# Patient Record
Sex: Male | Born: 1970 | Race: White | Hispanic: No | Marital: Single | Smoking: Current every day smoker
Health system: Southern US, Community
[De-identification: ages and names within clinical notes are randomized; demographics above are authoritative.]

## PROBLEM LIST (undated history)

## (undated) DIAGNOSIS — F32A Depression, unspecified: Secondary | ICD-10-CM

## (undated) DIAGNOSIS — G43509 Persistent migraine aura without cerebral infarction, not intractable, without status migrainosus: Secondary | ICD-10-CM

## (undated) DIAGNOSIS — R739 Hyperglycemia, unspecified: Secondary | ICD-10-CM

## (undated) DIAGNOSIS — J45909 Unspecified asthma, uncomplicated: Secondary | ICD-10-CM

## (undated) DIAGNOSIS — E785 Hyperlipidemia, unspecified: Secondary | ICD-10-CM

## (undated) DIAGNOSIS — M1990 Unspecified osteoarthritis, unspecified site: Secondary | ICD-10-CM

## (undated) DIAGNOSIS — E669 Obesity, unspecified: Secondary | ICD-10-CM

## (undated) DIAGNOSIS — I10 Essential (primary) hypertension: Secondary | ICD-10-CM

## (undated) DIAGNOSIS — I2510 Atherosclerotic heart disease of native coronary artery without angina pectoris: Secondary | ICD-10-CM

## (undated) DIAGNOSIS — N289 Disorder of kidney and ureter, unspecified: Secondary | ICD-10-CM

## (undated) DIAGNOSIS — F1410 Cocaine abuse, uncomplicated: Secondary | ICD-10-CM

## (undated) DIAGNOSIS — K219 Gastro-esophageal reflux disease without esophagitis: Secondary | ICD-10-CM

## (undated) DIAGNOSIS — F329 Major depressive disorder, single episode, unspecified: Secondary | ICD-10-CM

## (undated) DIAGNOSIS — G4733 Obstructive sleep apnea (adult) (pediatric): Secondary | ICD-10-CM

## (undated) DIAGNOSIS — R51 Headache: Secondary | ICD-10-CM

## (undated) DIAGNOSIS — R519 Headache, unspecified: Secondary | ICD-10-CM

## (undated) DIAGNOSIS — T50902A Poisoning by unspecified drugs, medicaments and biological substances, intentional self-harm, initial encounter: Secondary | ICD-10-CM

## (undated) HISTORY — PX: CARDIAC CATHETERIZATION: SHX172

## (undated) HISTORY — PX: APPENDECTOMY: SHX54

---

## 2002-03-15 ENCOUNTER — Emergency Department (HOSPITAL_COMMUNITY): Admission: EM | Admit: 2002-03-15 | Discharge: 2002-03-15 | Payer: Self-pay | Admitting: Emergency Medicine

## 2002-03-22 ENCOUNTER — Encounter: Payer: Self-pay | Admitting: Emergency Medicine

## 2002-03-22 ENCOUNTER — Emergency Department (HOSPITAL_COMMUNITY): Admission: EM | Admit: 2002-03-22 | Discharge: 2002-03-22 | Payer: Self-pay | Admitting: Emergency Medicine

## 2003-07-13 ENCOUNTER — Emergency Department (HOSPITAL_COMMUNITY): Admission: EM | Admit: 2003-07-13 | Discharge: 2003-07-13 | Payer: Self-pay

## 2003-07-24 ENCOUNTER — Emergency Department (HOSPITAL_COMMUNITY): Admission: EM | Admit: 2003-07-24 | Discharge: 2003-07-24 | Payer: Self-pay | Admitting: Emergency Medicine

## 2003-08-14 ENCOUNTER — Emergency Department (HOSPITAL_COMMUNITY): Admission: AD | Admit: 2003-08-14 | Discharge: 2003-08-14 | Payer: Self-pay | Admitting: Family Medicine

## 2003-11-02 ENCOUNTER — Emergency Department (HOSPITAL_COMMUNITY): Admission: EM | Admit: 2003-11-02 | Discharge: 2003-11-03 | Payer: Self-pay | Admitting: Emergency Medicine

## 2003-11-06 ENCOUNTER — Emergency Department (HOSPITAL_COMMUNITY): Admission: EM | Admit: 2003-11-06 | Discharge: 2003-11-06 | Payer: Self-pay | Admitting: Emergency Medicine

## 2004-01-15 ENCOUNTER — Emergency Department (HOSPITAL_COMMUNITY): Admission: EM | Admit: 2004-01-15 | Discharge: 2004-01-15 | Payer: Self-pay | Admitting: Emergency Medicine

## 2004-02-12 ENCOUNTER — Emergency Department (HOSPITAL_COMMUNITY): Admission: EM | Admit: 2004-02-12 | Discharge: 2004-02-12 | Payer: Self-pay | Admitting: Emergency Medicine

## 2004-02-15 ENCOUNTER — Emergency Department (HOSPITAL_COMMUNITY): Admission: EM | Admit: 2004-02-15 | Discharge: 2004-02-15 | Payer: Self-pay | Admitting: Emergency Medicine

## 2004-03-04 ENCOUNTER — Emergency Department (HOSPITAL_COMMUNITY): Admission: EM | Admit: 2004-03-04 | Discharge: 2004-03-04 | Payer: Self-pay | Admitting: Emergency Medicine

## 2004-03-20 ENCOUNTER — Emergency Department (HOSPITAL_COMMUNITY): Admission: EM | Admit: 2004-03-20 | Discharge: 2004-03-20 | Payer: Self-pay | Admitting: Emergency Medicine

## 2004-04-16 ENCOUNTER — Emergency Department: Payer: Self-pay | Admitting: Emergency Medicine

## 2004-05-18 ENCOUNTER — Emergency Department (HOSPITAL_COMMUNITY): Admission: EM | Admit: 2004-05-18 | Discharge: 2004-05-19 | Payer: Self-pay | Admitting: Emergency Medicine

## 2004-05-28 ENCOUNTER — Emergency Department: Payer: Self-pay | Admitting: General Practice

## 2004-07-16 ENCOUNTER — Emergency Department (HOSPITAL_COMMUNITY): Admission: EM | Admit: 2004-07-16 | Discharge: 2004-07-16 | Payer: Self-pay | Admitting: Emergency Medicine

## 2004-07-22 ENCOUNTER — Emergency Department (HOSPITAL_COMMUNITY): Admission: EM | Admit: 2004-07-22 | Discharge: 2004-07-22 | Payer: Self-pay | Admitting: Emergency Medicine

## 2004-10-27 ENCOUNTER — Emergency Department (HOSPITAL_COMMUNITY): Admission: EM | Admit: 2004-10-27 | Discharge: 2004-10-27 | Payer: Self-pay | Admitting: Emergency Medicine

## 2004-11-12 ENCOUNTER — Emergency Department (HOSPITAL_COMMUNITY): Admission: EM | Admit: 2004-11-12 | Discharge: 2004-11-12 | Payer: Self-pay | Admitting: Emergency Medicine

## 2004-11-30 ENCOUNTER — Emergency Department (HOSPITAL_COMMUNITY): Admission: EM | Admit: 2004-11-30 | Discharge: 2004-11-30 | Payer: Self-pay | Admitting: Emergency Medicine

## 2005-02-03 ENCOUNTER — Emergency Department (HOSPITAL_COMMUNITY): Admission: EM | Admit: 2005-02-03 | Discharge: 2005-02-03 | Payer: Self-pay | Admitting: Emergency Medicine

## 2006-03-07 ENCOUNTER — Emergency Department (HOSPITAL_COMMUNITY): Admission: EM | Admit: 2006-03-07 | Discharge: 2006-03-07 | Payer: Self-pay | Admitting: Emergency Medicine

## 2006-06-06 ENCOUNTER — Emergency Department (HOSPITAL_COMMUNITY): Admission: EM | Admit: 2006-06-06 | Discharge: 2006-06-06 | Payer: Self-pay | Admitting: Emergency Medicine

## 2006-07-04 ENCOUNTER — Emergency Department (HOSPITAL_COMMUNITY): Admission: EM | Admit: 2006-07-04 | Discharge: 2006-07-04 | Payer: Self-pay | Admitting: Emergency Medicine

## 2006-08-16 ENCOUNTER — Emergency Department (HOSPITAL_COMMUNITY): Admission: EM | Admit: 2006-08-16 | Discharge: 2006-08-16 | Payer: Self-pay | Admitting: Emergency Medicine

## 2006-08-24 ENCOUNTER — Emergency Department (HOSPITAL_COMMUNITY): Admission: EM | Admit: 2006-08-24 | Discharge: 2006-08-24 | Payer: Self-pay | Admitting: Emergency Medicine

## 2006-09-17 ENCOUNTER — Emergency Department (HOSPITAL_COMMUNITY): Admission: EM | Admit: 2006-09-17 | Discharge: 2006-09-17 | Payer: Self-pay | Admitting: Emergency Medicine

## 2006-11-25 ENCOUNTER — Emergency Department (HOSPITAL_COMMUNITY): Admission: EM | Admit: 2006-11-25 | Discharge: 2006-11-25 | Payer: Self-pay | Admitting: Emergency Medicine

## 2007-01-05 ENCOUNTER — Emergency Department (HOSPITAL_COMMUNITY): Admission: EM | Admit: 2007-01-05 | Discharge: 2007-01-06 | Payer: Self-pay | Admitting: Emergency Medicine

## 2007-02-04 ENCOUNTER — Emergency Department (HOSPITAL_COMMUNITY): Admission: EM | Admit: 2007-02-04 | Discharge: 2007-02-04 | Payer: Self-pay | Admitting: Emergency Medicine

## 2007-03-27 ENCOUNTER — Emergency Department (HOSPITAL_COMMUNITY): Admission: EM | Admit: 2007-03-27 | Discharge: 2007-03-27 | Payer: Self-pay | Admitting: Emergency Medicine

## 2007-04-23 ENCOUNTER — Emergency Department (HOSPITAL_COMMUNITY): Admission: EM | Admit: 2007-04-23 | Discharge: 2007-04-23 | Payer: Self-pay | Admitting: Emergency Medicine

## 2007-05-10 ENCOUNTER — Emergency Department (HOSPITAL_COMMUNITY): Admission: EM | Admit: 2007-05-10 | Discharge: 2007-05-10 | Payer: Self-pay | Admitting: Emergency Medicine

## 2007-05-14 ENCOUNTER — Emergency Department (HOSPITAL_COMMUNITY): Admission: EM | Admit: 2007-05-14 | Discharge: 2007-05-14 | Payer: Self-pay | Admitting: Emergency Medicine

## 2007-05-16 ENCOUNTER — Emergency Department (HOSPITAL_COMMUNITY): Admission: EM | Admit: 2007-05-16 | Discharge: 2007-05-16 | Payer: Self-pay | Admitting: Emergency Medicine

## 2007-05-21 ENCOUNTER — Emergency Department (HOSPITAL_COMMUNITY): Admission: EM | Admit: 2007-05-21 | Discharge: 2007-05-21 | Payer: Self-pay | Admitting: Emergency Medicine

## 2007-06-15 ENCOUNTER — Emergency Department (HOSPITAL_COMMUNITY): Admission: EM | Admit: 2007-06-15 | Discharge: 2007-06-15 | Payer: Self-pay | Admitting: Emergency Medicine

## 2007-06-18 ENCOUNTER — Ambulatory Visit: Payer: Self-pay | Admitting: Psychiatry

## 2007-06-18 ENCOUNTER — Emergency Department (HOSPITAL_COMMUNITY): Admission: EM | Admit: 2007-06-18 | Discharge: 2007-06-18 | Payer: Self-pay | Admitting: Emergency Medicine

## 2007-06-18 ENCOUNTER — Inpatient Hospital Stay (HOSPITAL_COMMUNITY): Admission: RE | Admit: 2007-06-18 | Discharge: 2007-06-25 | Payer: Self-pay | Admitting: Psychiatry

## 2007-07-02 ENCOUNTER — Emergency Department (HOSPITAL_COMMUNITY): Admission: EM | Admit: 2007-07-02 | Discharge: 2007-07-02 | Payer: Self-pay | Admitting: Emergency Medicine

## 2007-07-23 ENCOUNTER — Inpatient Hospital Stay (HOSPITAL_COMMUNITY): Admission: EM | Admit: 2007-07-23 | Discharge: 2007-07-24 | Payer: Self-pay | Admitting: Internal Medicine

## 2007-07-31 ENCOUNTER — Inpatient Hospital Stay (HOSPITAL_COMMUNITY): Admission: EM | Admit: 2007-07-31 | Discharge: 2007-08-04 | Payer: Self-pay | Admitting: Emergency Medicine

## 2007-08-06 ENCOUNTER — Emergency Department (HOSPITAL_COMMUNITY): Admission: EM | Admit: 2007-08-06 | Discharge: 2007-08-06 | Payer: Self-pay | Admitting: Emergency Medicine

## 2007-08-15 ENCOUNTER — Emergency Department (HOSPITAL_COMMUNITY): Admission: EM | Admit: 2007-08-15 | Discharge: 2007-08-16 | Payer: Self-pay | Admitting: Emergency Medicine

## 2007-08-21 ENCOUNTER — Emergency Department (HOSPITAL_COMMUNITY): Admission: EM | Admit: 2007-08-21 | Discharge: 2007-08-21 | Payer: Self-pay | Admitting: Emergency Medicine

## 2007-09-05 ENCOUNTER — Emergency Department (HOSPITAL_COMMUNITY): Admission: EM | Admit: 2007-09-05 | Discharge: 2007-09-05 | Payer: Self-pay | Admitting: Emergency Medicine

## 2007-09-07 ENCOUNTER — Emergency Department (HOSPITAL_COMMUNITY): Admission: EM | Admit: 2007-09-07 | Discharge: 2007-09-07 | Payer: Self-pay | Admitting: Emergency Medicine

## 2007-09-25 ENCOUNTER — Emergency Department (HOSPITAL_COMMUNITY): Admission: EM | Admit: 2007-09-25 | Discharge: 2007-09-26 | Payer: Self-pay | Admitting: Emergency Medicine

## 2007-09-29 ENCOUNTER — Emergency Department (HOSPITAL_COMMUNITY): Admission: EM | Admit: 2007-09-29 | Discharge: 2007-09-30 | Payer: Self-pay | Admitting: Emergency Medicine

## 2009-02-18 ENCOUNTER — Emergency Department (HOSPITAL_COMMUNITY): Admission: EM | Admit: 2009-02-18 | Discharge: 2009-02-18 | Payer: Self-pay | Admitting: Emergency Medicine

## 2009-03-26 ENCOUNTER — Emergency Department (HOSPITAL_COMMUNITY): Admission: EM | Admit: 2009-03-26 | Discharge: 2009-03-26 | Payer: Self-pay | Admitting: Emergency Medicine

## 2009-05-18 ENCOUNTER — Emergency Department (HOSPITAL_COMMUNITY): Admission: EM | Admit: 2009-05-18 | Discharge: 2009-05-18 | Payer: Self-pay | Admitting: Emergency Medicine

## 2009-05-27 ENCOUNTER — Emergency Department (HOSPITAL_COMMUNITY): Admission: EM | Admit: 2009-05-27 | Discharge: 2009-05-27 | Payer: Self-pay | Admitting: Emergency Medicine

## 2009-06-07 ENCOUNTER — Emergency Department (HOSPITAL_COMMUNITY): Admission: EM | Admit: 2009-06-07 | Discharge: 2009-06-07 | Payer: Self-pay | Admitting: Emergency Medicine

## 2009-06-09 ENCOUNTER — Emergency Department (HOSPITAL_COMMUNITY): Admission: EM | Admit: 2009-06-09 | Discharge: 2009-06-09 | Payer: Self-pay | Admitting: Emergency Medicine

## 2009-07-18 ENCOUNTER — Emergency Department (HOSPITAL_COMMUNITY): Admission: EM | Admit: 2009-07-18 | Discharge: 2009-07-19 | Payer: Self-pay | Admitting: Emergency Medicine

## 2009-07-19 ENCOUNTER — Emergency Department (HOSPITAL_COMMUNITY): Admission: EM | Admit: 2009-07-19 | Discharge: 2009-07-19 | Payer: Self-pay | Admitting: Emergency Medicine

## 2009-07-21 ENCOUNTER — Inpatient Hospital Stay (HOSPITAL_COMMUNITY): Admission: EM | Admit: 2009-07-21 | Discharge: 2009-07-22 | Payer: Self-pay | Admitting: Emergency Medicine

## 2009-09-07 ENCOUNTER — Emergency Department (HOSPITAL_COMMUNITY): Admission: EM | Admit: 2009-09-07 | Discharge: 2009-09-07 | Payer: Self-pay | Admitting: Emergency Medicine

## 2009-10-15 ENCOUNTER — Emergency Department (HOSPITAL_COMMUNITY): Admission: EM | Admit: 2009-10-15 | Discharge: 2009-10-15 | Payer: Self-pay | Admitting: Emergency Medicine

## 2009-10-22 ENCOUNTER — Emergency Department (HOSPITAL_COMMUNITY): Admission: EM | Admit: 2009-10-22 | Discharge: 2009-10-22 | Payer: Self-pay | Admitting: Emergency Medicine

## 2009-11-10 ENCOUNTER — Emergency Department (HOSPITAL_COMMUNITY): Admission: EM | Admit: 2009-11-10 | Discharge: 2009-11-10 | Payer: Self-pay | Admitting: Emergency Medicine

## 2010-01-06 ENCOUNTER — Emergency Department (HOSPITAL_COMMUNITY): Admission: EM | Admit: 2010-01-06 | Discharge: 2010-01-06 | Payer: Self-pay | Admitting: Emergency Medicine

## 2010-01-11 ENCOUNTER — Emergency Department (HOSPITAL_COMMUNITY): Admission: EM | Admit: 2010-01-11 | Discharge: 2010-01-11 | Payer: Self-pay | Admitting: Emergency Medicine

## 2010-01-15 ENCOUNTER — Emergency Department (HOSPITAL_COMMUNITY): Admission: EM | Admit: 2010-01-15 | Discharge: 2010-01-15 | Payer: Self-pay | Admitting: Emergency Medicine

## 2010-02-19 ENCOUNTER — Emergency Department (HOSPITAL_COMMUNITY): Admission: EM | Admit: 2010-02-19 | Discharge: 2010-02-20 | Payer: Self-pay | Admitting: Emergency Medicine

## 2010-03-01 ENCOUNTER — Emergency Department (HOSPITAL_COMMUNITY): Admission: EM | Admit: 2010-03-01 | Discharge: 2010-03-02 | Payer: Self-pay | Admitting: Emergency Medicine

## 2010-03-02 ENCOUNTER — Inpatient Hospital Stay (HOSPITAL_COMMUNITY): Admission: AD | Admit: 2010-03-02 | Discharge: 2010-03-15 | Payer: Self-pay | Admitting: Psychiatry

## 2010-03-02 ENCOUNTER — Ambulatory Visit: Payer: Self-pay | Admitting: Psychiatry

## 2010-03-03 ENCOUNTER — Emergency Department (HOSPITAL_COMMUNITY): Admission: EM | Admit: 2010-03-03 | Discharge: 2010-03-03 | Payer: Self-pay | Admitting: Emergency Medicine

## 2010-03-09 ENCOUNTER — Emergency Department (HOSPITAL_COMMUNITY): Admission: EM | Admit: 2010-03-09 | Discharge: 2010-03-10 | Payer: Self-pay | Admitting: Emergency Medicine

## 2010-03-09 ENCOUNTER — Ambulatory Visit (HOSPITAL_COMMUNITY): Admission: RE | Admit: 2010-03-09 | Discharge: 2010-03-09 | Payer: Self-pay | Admitting: Psychiatry

## 2010-03-27 ENCOUNTER — Emergency Department (HOSPITAL_COMMUNITY): Admission: EM | Admit: 2010-03-27 | Discharge: 2010-03-27 | Payer: Self-pay | Admitting: Emergency Medicine

## 2010-04-08 ENCOUNTER — Emergency Department (HOSPITAL_COMMUNITY): Admission: EM | Admit: 2010-04-08 | Discharge: 2010-04-08 | Payer: Self-pay | Admitting: Emergency Medicine

## 2010-04-10 ENCOUNTER — Emergency Department (HOSPITAL_COMMUNITY): Admission: EM | Admit: 2010-04-10 | Discharge: 2010-04-10 | Payer: Self-pay | Admitting: Emergency Medicine

## 2010-04-17 ENCOUNTER — Emergency Department (HOSPITAL_COMMUNITY): Admission: EM | Admit: 2010-04-17 | Discharge: 2010-04-17 | Payer: Self-pay | Admitting: Emergency Medicine

## 2010-04-19 ENCOUNTER — Emergency Department (HOSPITAL_COMMUNITY): Admission: EM | Admit: 2010-04-19 | Discharge: 2010-04-19 | Payer: Self-pay | Admitting: Emergency Medicine

## 2010-05-16 ENCOUNTER — Emergency Department (HOSPITAL_COMMUNITY): Admission: EM | Admit: 2010-05-16 | Discharge: 2009-07-05 | Payer: Self-pay | Admitting: Emergency Medicine

## 2010-05-16 ENCOUNTER — Emergency Department (HOSPITAL_COMMUNITY): Admission: EM | Admit: 2010-05-16 | Discharge: 2009-12-17 | Payer: Self-pay | Admitting: Emergency Medicine

## 2010-06-06 ENCOUNTER — Emergency Department (HOSPITAL_COMMUNITY)
Admission: EM | Admit: 2010-06-06 | Discharge: 2010-06-06 | Payer: Self-pay | Source: Home / Self Care | Admitting: Emergency Medicine

## 2010-06-09 DIAGNOSIS — T50902A Poisoning by unspecified drugs, medicaments and biological substances, intentional self-harm, initial encounter: Secondary | ICD-10-CM

## 2010-06-09 HISTORY — DX: Poisoning by unspecified drugs, medicaments and biological substances, intentional self-harm, initial encounter: T50.902A

## 2010-06-15 ENCOUNTER — Inpatient Hospital Stay (HOSPITAL_COMMUNITY)
Admission: AD | Admit: 2010-06-15 | Discharge: 2010-06-28 | Payer: Self-pay | Source: Home / Self Care | Attending: Psychiatry | Admitting: Psychiatry

## 2010-06-15 ENCOUNTER — Emergency Department (HOSPITAL_COMMUNITY)
Admission: EM | Admit: 2010-06-15 | Discharge: 2010-06-15 | Disposition: A | Discharge status: Other Acute Inpatient Hospital | Payer: Self-pay | Source: Home / Self Care | Admitting: Emergency Medicine

## 2010-06-21 ENCOUNTER — Emergency Department (HOSPITAL_COMMUNITY)
Admission: EM | Admit: 2010-06-21 | Discharge: 2010-06-21 | Payer: Self-pay | Source: Home / Self Care | Admitting: Emergency Medicine

## 2010-06-24 LAB — BASIC METABOLIC PANEL
BUN: 19 mg/dL (ref 6–23)
CO2: 23 mEq/L (ref 19–32)
Calcium: 8.8 mg/dL (ref 8.4–10.5)
Chloride: 109 mEq/L (ref 96–112)
Creatinine, Ser: 0.95 mg/dL (ref 0.4–1.5)
GFR calc Af Amer: >60 mL/min (ref >60)
GFR calc non Af Amer: >60 mL/min (ref >60)
Glucose, Bld: 115 mg/dL — ABNORMAL HIGH (ref 70–99)
Potassium: 4.1 mEq/L (ref 3.5–5.1)
Sodium: 140 mEq/L (ref 135–145)

## 2010-06-24 LAB — CBC
HCT: 42.7 % (ref 39.0–52.0)
Hemoglobin: 13.8 g/dL (ref 13.0–17.0)
MCH: 27.8 pg (ref 26.0–34.0)
MCHC: 32.3 g/dL (ref 30.0–36.0)
MCV: 85.9 fL (ref 78.0–100.0)
Platelets: 320 10*3/uL (ref 150–400)
RBC: 4.97 MIL/uL (ref 4.22–5.81)
RDW: 14.2 % (ref 11.5–15.5)
WBC: 10.8 10*3/uL — ABNORMAL HIGH (ref 4.0–10.5)

## 2010-06-24 LAB — DIFFERENTIAL
Basophils Absolute: 0 10*3/uL (ref 0.0–0.1)
Basophils Relative: 0 % (ref 0–1)
Eosinophils Absolute: 0.2 10*3/uL (ref 0.0–0.7)
Eosinophils Relative: 2 % (ref 0–5)
Lymphocytes Relative: 19 % (ref 12–46)
Lymphs Abs: 2 10*3/uL (ref 0.7–4.0)
Monocytes Absolute: 0.9 10*3/uL (ref 0.1–1.0)
Monocytes Relative: 9 % (ref 3–12)
Neutro Abs: 7.6 10*3/uL (ref 1.7–7.7)
Neutrophils Relative %: 70 % (ref 43–77)

## 2010-06-24 LAB — HEPATIC FUNCTION PANEL
ALT: 25 U/L (ref 0–53)
AST: 18 U/L (ref 0–37)
Albumin: 3.2 g/dL — ABNORMAL LOW (ref 3.5–5.2)
Alkaline Phosphatase: 51 U/L (ref 39–117)
Bilirubin, Direct: <0.1 mg/dL (ref 0.0–0.3)
Total Bilirubin: 0.3 mg/dL (ref 0.3–1.2)
Total Protein: 6.5 g/dL (ref 6.0–8.3)

## 2010-06-24 LAB — FOLATE: Folate: 4.9 ng/mL

## 2010-06-24 LAB — ETHANOL: Alcohol, Ethyl (B): <5 mg/dL (ref 0–10)

## 2010-06-24 LAB — RAPID URINE DRUG SCREEN, HOSP PERFORMED
Amphetamines: NOT DETECTED
Barbiturates: NOT DETECTED
Benzodiazepines: NOT DETECTED
Cocaine: NOT DETECTED
Opiates: NOT DETECTED
Tetrahydrocannabinol: POSITIVE — AB

## 2010-06-24 LAB — VITAMIN B12: Vitamin B-12: 338 pg/mL (ref 211–911)

## 2010-06-24 LAB — TSH: TSH: 1.405 u[IU]/mL (ref 0.350–4.500)

## 2010-06-25 ENCOUNTER — Ambulatory Visit (HOSPITAL_COMMUNITY)
Admission: RE | Admit: 2010-06-25 | Discharge: 2010-06-25 | Payer: Self-pay | Source: Home / Self Care | Attending: Psychiatry | Admitting: Psychiatry

## 2010-06-28 NOTE — H&P (Signed)
  NAME:  Richard Anderson, Richard Anderson NO.:  [id number]  MEDICAL RECORD NO.:  [id number]          PATIENT TYPE:  IPS  LOCATION:  0503                          FACILITY:  [facility]  PHYSICIAN:  Marlis Edelson, DO        DATE OF BIRTH:  1971-02-24  DATE OF ADMISSION:  06/15/2010 DATE OF DISCHARGE:                      PSYCHIATRIC ADMISSION ASSESSMENT   HISTORY OF PRESENT ILLNESS:  The patient presents with a history of depression having suicidal thoughts to overdose, also using substances drinking, smoking marijuana and using pain pills.  He is endorsing a financial issues and grief issues, losing his home due to back taxes. He states he has been off his medications for some time.  States that he was unable to go for his follow-up, unable to find his paperwork for his initial postoperative appointment.  PAST PSYCHIATRIC HISTORY:  The patient was here in October for polysubstance abuse.  Again did not attend his follow-up.  SOCIAL HISTORY:  The patient is single, is unemployed, resides in [place].  FAMILY HISTORY:  None.  ALCOHOL/DRUG HISTORY:  As above.  Drinking and using marijuana and pain pills.  PRIMARY CARE PROVIDER:  None.  MEDICAL PROBLEMS:  The patient has a history of hypertension, asthma, is also morbidly obese.  MEDICATION:  Has been noncompliant.  DRUG ALLERGIES:  HALDOL.  PHYSICAL EXAM:  Again this is a morbidly obese male assessed in the emergency department.  His urine drug screen positive for cannabis. Alcohol level was less than five.  MENTAL STATUS EXAM:  The patient is in bed and disheveled.  He reports depressive symptoms.  His thought process are coherent, goal directed. No evidence of any internal stimuli.  Judgment and insight are fair.  AXIS I:  Mood disorder, polysubstance abuse. AXIS II:  Deferred. AXIS III:  History of hypertension, asthma, morbid obesity. AXIS IV:  Psychosocial problems, financial issues, grief issues. AXIS V:  Current is  35-40.  Our plan is to place the patient on the detox protocol, monitor withdrawal symptoms, continue to assess comorbidities.  Reinforce med compliance and follow-up.  Will identify support group.  The patient may need some individual therapy to cope with his grief issues and coping skills.  We will also address his substance use.  His tentative length of stay at this time is 4-5 days.     Landry Corporal, N.P.   ______________________________ Marlis Edelson, DO    JO/MEDQ  D:  06/25/2010  T:  06/25/2010  Job:  [id number]  Electronically Signed by Limmie PatriciaP. on 06/25/2010 02:27:50 PM Electronically Signed by Marlis Edelson MD on 06/27/2010 07:27:06 PM

## 2010-06-29 NOTE — Discharge Summary (Addendum)
NAME:  AVANT, PRINTY NO.:  [id number]  MEDICAL RECORD NO.:  [id number]          PATIENT TYPE:  IPS  LOCATION:  0503                          FACILITY:  [facility]  PHYSICIAN:  Marlis Edelson, DO        DATE OF BIRTH:  23-Jul-1970  DATE OF ADMISSION:  06/15/2010 DATE OF DISCHARGE:  06/28/2010                              DISCHARGE SUMMARY   AGE:  40.  CHIEF COMPLAINT:  Depression with suicidal thoughts.  HISTORY OF THE CHIEF COMPLAINT:  Richard Anderson is a 40 year old Caucasian male who was admitted to the [facility] on June 15, 2010.  He was reporting a history of depression with suicidal thoughts and a plan to overdose.  He was also using substances that included alcohol, marijuana and pain pills.  He was endorsing stressors stemming from finances and grief and loss issues due to the loss of his family home from back taxes, his loss of overall health due to osteoarthritis and extreme obesity, and loss of family members from death.  This included the death of a niece during his hospitalization who was killed in a playing accident in [place] when she ran out into the street and was struck by motor vehicle.  He had also previously lost his mother.  He has been unable to go for followup for health concerns.  He had previously been admitted to this facility for polysubstance use and did not follow through.  He was on no medications at the time of admission.  His medical history includes hypertension, asthma and morbid obesity.  ALLERGIES:  HE HAS DRUG ALLERGIES TO HALDOL.  HOSPITAL COURSE:  Mr. Depascale was placed on the adult unit, where he was integrated into the treatment milieu.  He was in group psychotherapy as well as individual sessions with his provider and with his counselors. He was initially placed on lisinopril 20 mg daily for hypertension.  He received p.r.n. medications and a Librium detox protocol due to his history of alcohol use.  Because  of increased irritability and agitation early on he was placed in a room with no roommate.  He was provided Ambien 10 mg q.h.s. for sleep, which he has used for some time because of chronic insomnia.  He also received ibuprofen 800 mg p.r.n. for pain. During the course of hospitalization he became highly agitated on several occasions, particularly aimed at fears.  He was verbally escalating his behaviors but was not physically aggressive towards others.  This occurred on several occasions when 1 male patient made the comment that she understood why his family members were dying, that she did not blame them in order to get away from him.  The other occasion occurred in a grief and loss counseling session in which another patient did make an inappropriate statement about him.   Medication adjustments included the discontinuation of Prozac due to lack of efficacy.  He was placed on Celebrex 100 mg twice per day for osteoarthritis.  The Celebrex was beneficial at reducing his pain and the edema in his knees.  He was started on Effexor 75 mg XR p.o. daily and  titrated to a dose of 225 mg XR daily for depression and anxiety. He responded well to the Effexor and had a marked change in mood, affect and suicidality.  His suicidal ideation resolved.  He became much more engaging.  He established better eye contact.  His grooming improved. He would make jokes.  He would smile spontaneously and his interacts with peers improved.  He was provided folate for supplementation, as well as, vitamin B12.   We did try Ambien CR, which was ineffective in treating his insomnia.  He did received a Ventolin inhaler on a p.r.n. basis because of his history of asthma.  He was tried on Abilify to help augment the Effexor.  The Abilify was increased to 10 mg daily.  He did receive Ativan for alcohol detoxification.  The Ativan was tapered and discontinued without any seizures, DTs or other complications of  alcohol withdrawal.  He did hit his fist against the wall during one of the above described episodes (when another patient made an inappropriate comment). X-ray of the hand was negative for fracture but positive for edema.  The Celebrex was continued.  He received a one-time dose of Ultram, and ice packs were applied until the pain resolved.  He was also started on Neurontin and titrated to 3 times a day for anxiety, irritability, and pain.  He tolerated that medication well.  With continued improvement, increased activity in groups and on the unit, no suicidal or homicidal ideation, positive mood and affective change, he was discharged in improved condition on June 28, 2010.  IMAGING STUDIES:  Of the right hand revealed no acute fractures, positive soft tissue swelling over the proximal phalanges, otherwise unremarkable imaging studies.  LABORATORY:  Urine drug screen at the time of admission was positive for THC.  Serum folate was 4.9 ng/mL.  Vitamin B12 was 338 pg/mL.  That was supplemented due to the value being below 400 in the setting of significant depression.  Hepatic function panel was normal with the exception of an albumin being low at 3.2 gm/dL.  TSH was normal at 1.405 international units/mL.  CONSULTATIONS:  None.  COMPLICATIONS:  Behavioral discord as outlined above, resulting in one period of time in the quiet room with the door open and staff in attendance.  PROCEDURES:  None.  MENTAL STATUS EXAM:  At the time of discharge hygiene was much improved. Eye contact was appropriate.  Motor behavior was normal.  Speech was clear, regular rate, rhythm and volume.  His level of consciousness was alert.  He related his mood as well.  Affect was congruent, with full range evident.  No significant anxiety or depression.  Thought process linear, logical, and goal-directed.  Thought content without perceptual symptoms, ideas of reference, delusions or paranoia.  No  suicidal or homicidal thought, intent or plan.  No hypomania or mania.  No evidence of further substance withdrawal or medication intolerance.  His judgment had improved.  His insight was increased and he was cognitively intact.  ASSESSMENT:  AXIS I:  Polysubstance dependence (marijuana and alcohol). Major depressive disorder, chronic recurrent.  Post-traumatic stress disorder due to a history of childhood traumas and intermittent explosive disorder. AXIS II:  Deferred. AXIS III:  As stated in the above past medical history. AXIS IV:  Chronic stressors. AXIS V:  60.  DISCHARGE INSTRUCTIONS:  Follow up with Wendie Chess with [facility] on Thursday, July 04, 2010 at 12:15 p.m.  SPECIAL INSTRUCTIONS:  He is to return to the hospital  for any marked changes in mood or affect, redevelopment of suicidal or homicidal ideation.  Avoidance of substances of abuse is highly recommended, both illicit and prescription.  Do recommend attendance to NA/AA with 90 meetings in 90 days.  DISCHARGE MEDICATIONS:  Stop Wellbutrin 150 mg XL.  Albuterol inhaler 2 puffs q.6 h. p.r.n.  Ambien 10 mg q.h.s. for insomnia.  Effexor 225 mg XR daily.  Lisinopril 20 mg daily for hypertension.  Vistaril 50 mg twice daily as needed for anxiety.  Gabapentin 300 mg 3 times a day for mood stability, irritability and anxiety.  Folic acid 1 mg daily. Vitamin B12 1000 mcg p.o. daily.  Celebrex 1 twice daily.  Abilify 10 mg daily for depression.  PROGNOSIS:  Is guarded due to the patient's chronic substance abuse and history of relapse.          ______________________________ Marlis Edelson, DO     DB/MEDQ  D:  06/28/2010  T:  06/28/2010  Job:  [id number]  Electronically Signed by Marlis Edelson MD on 06/29/2010 07:56:12 PM

## 2010-07-06 ENCOUNTER — Inpatient Hospital Stay (HOSPITAL_COMMUNITY)
Admission: EM | Admit: 2010-07-06 | Discharge: 2010-07-09 | Payer: Self-pay | Source: Home / Self Care | Attending: Internal Medicine | Admitting: Internal Medicine

## 2010-07-06 LAB — URINALYSIS, ROUTINE W REFLEX MICROSCOPIC
Bilirubin Urine: NEGATIVE
Ketones, ur: NEGATIVE mg/dL
Specific Gravity, Urine: >1.046 — ABNORMAL HIGH (ref 1.005–1.030)
Urobilinogen, UA: 0.2 mg/dL (ref 0.0–1.0)

## 2010-07-06 LAB — CBC
HCT: 39.5 % (ref 39.0–52.0)
Hemoglobin: 12.2 g/dL — ABNORMAL LOW (ref 13.0–17.0)
MCV: 85.3 fL (ref 78.0–100.0)
WBC: 9 10*3/uL (ref 4.0–10.5)

## 2010-07-06 LAB — URINE MICROSCOPIC-ADD ON

## 2010-07-06 LAB — COMPREHENSIVE METABOLIC PANEL
Albumin: 3.2 g/dL — ABNORMAL LOW (ref 3.5–5.2)
Alkaline Phosphatase: 54 U/L (ref 39–117)
BUN: 14 mg/dL (ref 6–23)
CO2: 25 mEq/L (ref 19–32)
Chloride: 109 mEq/L (ref 96–112)
GFR calc non Af Amer: >60 mL/min (ref >60)
Glucose, Bld: 112 mg/dL — ABNORMAL HIGH (ref 70–99)
Potassium: 4 mEq/L (ref 3.5–5.1)
Total Bilirubin: 0.3 mg/dL (ref 0.3–1.2)

## 2010-07-06 LAB — DIFFERENTIAL
Basophils Absolute: 0 10*3/uL (ref 0.0–0.1)
Lymphocytes Relative: 16 % (ref 12–46)
Lymphs Abs: 1.5 10*3/uL (ref 0.7–4.0)
Neutro Abs: 6.2 10*3/uL (ref 1.7–7.7)

## 2010-07-06 LAB — RAPID URINE DRUG SCREEN, HOSP PERFORMED
Amphetamines: NOT DETECTED
Barbiturates: NOT DETECTED
Tetrahydrocannabinol: POSITIVE — AB

## 2010-07-06 LAB — LIPASE, BLOOD: Lipase: 99 U/L — ABNORMAL HIGH (ref 11–59)

## 2010-07-06 LAB — GLUCOSE, CAPILLARY: Glucose-Capillary: 89 mg/dL (ref 70–99)

## 2010-07-07 LAB — CBC
Platelets: 335 10*3/uL (ref 150–400)
RBC: 4.76 MIL/uL (ref 4.22–5.81)
RDW: 15.1 % (ref 11.5–15.5)
WBC: 10.6 10*3/uL — ABNORMAL HIGH (ref 4.0–10.5)

## 2010-07-07 LAB — COMPREHENSIVE METABOLIC PANEL
ALT: 39 U/L (ref 0–53)
AST: 30 U/L (ref 0–37)
Albumin: 3.3 g/dL — ABNORMAL LOW (ref 3.5–5.2)
Calcium: 8.4 mg/dL (ref 8.4–10.5)
GFR calc Af Amer: >60 mL/min (ref >60)
Sodium: 139 mEq/L (ref 135–145)
Total Protein: 6.4 g/dL (ref 6.0–8.3)

## 2010-07-08 LAB — BASIC METABOLIC PANEL
BUN: 10 mg/dL (ref 6–23)
CO2: 25 mEq/L (ref 19–32)
Chloride: 101 mEq/L (ref 96–112)
Glucose, Bld: 109 mg/dL — ABNORMAL HIGH (ref 70–99)
Potassium: 5.2 mEq/L — ABNORMAL HIGH (ref 3.5–5.1)

## 2010-07-08 LAB — LIPASE, BLOOD: Lipase: 18 U/L (ref 11–59)

## 2010-07-09 LAB — BASIC METABOLIC PANEL
CO2: 27 mEq/L (ref 19–32)
Calcium: 8.4 mg/dL (ref 8.4–10.5)
Glucose, Bld: 130 mg/dL — ABNORMAL HIGH (ref 70–99)
Sodium: 138 mEq/L (ref 135–145)

## 2010-07-09 LAB — CBC
HCT: 39.1 % (ref 39.0–52.0)
Hemoglobin: 12.2 g/dL — ABNORMAL LOW (ref 13.0–17.0)
MCHC: 31.2 g/dL (ref 30.0–36.0)

## 2010-07-24 NOTE — Discharge Summary (Signed)
NAME:  Richard Anderson, Richard Anderson NO.:  [id number]  MEDICAL RECORD NO.:  [id number]          PATIENT TYPE:  INP  LOCATION:  5526                         FACILITY:  [facility]  PHYSICIAN:  Peggye Pitt, M.D. DATE OF BIRTH:  1971/03/20  DATE OF ADMISSION:  07/06/2010 DATE OF DISCHARGE:  07/09/2010                              DISCHARGE SUMMARY   PRIMARY CARE PHYSICIAN:  He will see Dr. Andrey Campanile at [facility] on August 15, 2010.  He will be his new primary care physician.  DISCHARGE DIAGNOSES: 1. Nausea, vomiting, diarrhea, secondary to acute viral     gastroenteritis, resolved. 2. Asthma. 3. Hypertension. 4. Alcohol abuse. 5. Depression. 6. Morbid obesity. 7. History of polysubstance abuse.  DISCHARGE MEDICATIONS: 1. Advair 250/50 one puff twice daily. 2. Ambien 10 mg at bedtime. 3. Albuterol 2 puffs every 6 hours as needed for shortness of breath. 4. Abilify 10 mg daily. 5. Vitamin B12 1000 mcg daily. 6. Folic acid 1 mg daily. 7. Neurontin 300 mg one capsule three times a day. 8. Ibuprofen 800 mg four times a day. 9. Lisinopril 20 mg daily. 10.Effexor 75 mg 3 capsules daily.  DISPOSITION AND FOLLOWUP:  Richard Anderson will be discharged home today in stable and improved condition.  Case management has secured him an appointment as a new patient at [facility] with Dr. Andrey Campanile on August 15, 2010, at 10:15 a.m.  CONSULTATION THIS HOSPITALIZATION:  None.  IMAGES AND PROCEDURES:  A CT scan of the abdomen and pelvis on July 06, 2010, that showed no acute abnormality with no evidence of pancreatitis with fatty infiltration of the liver and stable low attenuation lesion in the mid right kidney.  HISTORY AND PHYSICAL:  For full details, see dictation by Dr. Susie Cassette on July 06, 2010, but in brief, Richard Anderson is a 40 year old obese white gentleman with a recent admission to behavioral health for depression with suicidal thoughts.  Discharged on June 28, 2010, he returned  to our hospital on July 06, 2010, with complaints of nausea, vomiting, and abdominal pain for 2 days as well as insomnia.  He also had significant diarrhea about five episodes a day.  Because of this, we were asked to admit him for further evaluation and management.  HOSPITAL COURSE BY PROBLEM: 1. Abdominal pain, nausea, vomiting, and diarrhea.  This has all     resolved.  It has been self-limited.  We believe at this point that     this is likely secondary to acute viral gastroenteritis.  He has     not required any use of antibiotics. 2. Morbid obesity.  He has been encouraged on weight loss.  According     to the patient, he used weigh 585 pounds, so he has lost     approximately 120 pounds of weight, and we have encouraged him to     continue this. 3. Tobacco and polysubstance abuse.  He used to do a lot of alcohol,     as well as marijuana, and he has discontinued this according to     him; however, his urine drug on admission was positive for  marijuana.  I have continued to encourage both smoking, alcohol, and substance cessation. 4. All the rest of his chronic issues have been stable this     hospitalization.  case management will assist him in getting a supply of medications from the hospital, and they have also set him up to be seen as a new patient at [facility] as stated above under disposition and followup section.  VITAL SIGNS ON DAY OF DISCHARGE:  Blood pressure 121/84, heart rate 88, respirations 26, sats 95% on room air, and temperature 97.3.     Peggye Pitt, M.D.     EH/MEDQ  D:  07/09/2010  T:  07/10/2010  Job:  [id number]  cc:   Dr. Andrey Campanile at [facility]  Electronically Signed by Peggye Pitt M.D. on 07/24/2010 08:01:17 AM

## 2010-07-27 ENCOUNTER — Emergency Department (HOSPITAL_COMMUNITY): Payer: Self-pay

## 2010-07-27 ENCOUNTER — Emergency Department (HOSPITAL_COMMUNITY)
Admission: EM | Admit: 2010-07-27 | Discharge: 2010-07-27 | Disposition: A | Discharge status: Home / Self Care | Payer: Self-pay | Attending: Nurse Practitioner | Admitting: Nurse Practitioner

## 2010-07-27 DIAGNOSIS — R112 Nausea with vomiting, unspecified: Secondary | ICD-10-CM | POA: Insufficient documentation

## 2010-07-27 DIAGNOSIS — R197 Diarrhea, unspecified: Secondary | ICD-10-CM | POA: Insufficient documentation

## 2010-07-27 DIAGNOSIS — R109 Unspecified abdominal pain: Secondary | ICD-10-CM | POA: Insufficient documentation

## 2010-07-27 DIAGNOSIS — J45909 Unspecified asthma, uncomplicated: Secondary | ICD-10-CM | POA: Insufficient documentation

## 2010-07-27 DIAGNOSIS — I10 Essential (primary) hypertension: Secondary | ICD-10-CM | POA: Insufficient documentation

## 2010-07-27 DIAGNOSIS — F209 Schizophrenia, unspecified: Secondary | ICD-10-CM | POA: Insufficient documentation

## 2010-07-27 LAB — BASIC METABOLIC PANEL
BUN: 16 mg/dL (ref 6–23)
Calcium: 8.7 mg/dL (ref 8.4–10.5)
Creatinine, Ser: 0.96 mg/dL (ref 0.4–1.5)
GFR calc non Af Amer: >60 mL/min (ref >60)
Glucose, Bld: 97 mg/dL (ref 70–99)

## 2010-07-27 LAB — URINALYSIS, ROUTINE W REFLEX MICROSCOPIC
Bilirubin Urine: NEGATIVE
Ketones, ur: NEGATIVE mg/dL
Nitrite: NEGATIVE
Urobilinogen, UA: 0.2 mg/dL (ref 0.0–1.0)

## 2010-07-27 LAB — CBC
HCT: 39.9 % (ref 39.0–52.0)
MCH: 27.7 pg (ref 26.0–34.0)
MCHC: 33.1 g/dL (ref 30.0–36.0)
MCV: 83.6 fL (ref 78.0–100.0)
RDW: 14.8 % (ref 11.5–15.5)

## 2010-07-27 LAB — DIFFERENTIAL
Basophils Absolute: 0 10*3/uL (ref 0.0–0.1)
Eosinophils Relative: 2 % (ref 0–5)
Lymphocytes Relative: 17 % (ref 12–46)
Lymphs Abs: 1.8 10*3/uL (ref 0.7–4.0)
Monocytes Absolute: 0.9 10*3/uL (ref 0.1–1.0)

## 2010-07-30 ENCOUNTER — Emergency Department (HOSPITAL_COMMUNITY)
Admission: EM | Admit: 2010-07-30 | Discharge: 2010-07-31 | Disposition: A | Discharge status: Home / Self Care | Payer: Self-pay | Attending: Emergency Medicine | Admitting: Emergency Medicine

## 2010-07-30 DIAGNOSIS — N390 Urinary tract infection, site not specified: Secondary | ICD-10-CM | POA: Insufficient documentation

## 2010-07-30 DIAGNOSIS — R197 Diarrhea, unspecified: Secondary | ICD-10-CM | POA: Insufficient documentation

## 2010-07-30 DIAGNOSIS — G8929 Other chronic pain: Secondary | ICD-10-CM | POA: Insufficient documentation

## 2010-07-30 DIAGNOSIS — I10 Essential (primary) hypertension: Secondary | ICD-10-CM | POA: Insufficient documentation

## 2010-07-30 DIAGNOSIS — R109 Unspecified abdominal pain: Secondary | ICD-10-CM | POA: Insufficient documentation

## 2010-07-30 DIAGNOSIS — R300 Dysuria: Secondary | ICD-10-CM | POA: Insufficient documentation

## 2010-07-30 DIAGNOSIS — J45909 Unspecified asthma, uncomplicated: Secondary | ICD-10-CM | POA: Insufficient documentation

## 2010-07-30 DIAGNOSIS — R112 Nausea with vomiting, unspecified: Secondary | ICD-10-CM | POA: Insufficient documentation

## 2010-07-30 LAB — URINALYSIS, ROUTINE W REFLEX MICROSCOPIC
Ketones, ur: NEGATIVE mg/dL
Urine Glucose, Fasting: NEGATIVE mg/dL
pH: 8 (ref 5.0–8.0)

## 2010-07-30 LAB — URINE MICROSCOPIC-ADD ON

## 2010-07-30 LAB — DIFFERENTIAL
Eosinophils Relative: 2 % (ref 0–5)
Lymphocytes Relative: 16 % (ref 12–46)
Lymphs Abs: 2.2 10*3/uL (ref 0.7–4.0)
Monocytes Absolute: 1.3 10*3/uL — ABNORMAL HIGH (ref 0.1–1.0)
Neutro Abs: 9.5 10*3/uL — ABNORMAL HIGH (ref 1.7–7.7)

## 2010-07-30 LAB — COMPREHENSIVE METABOLIC PANEL
ALT: 28 U/L (ref 0–53)
AST: 24 U/L (ref 0–37)
Calcium: 9.1 mg/dL (ref 8.4–10.5)
GFR calc Af Amer: >60 mL/min (ref >60)
Glucose, Bld: 136 mg/dL — ABNORMAL HIGH (ref 70–99)
Sodium: 140 mEq/L (ref 135–145)
Total Protein: 6.7 g/dL (ref 6.0–8.3)

## 2010-07-30 LAB — CBC
MCHC: 32.7 g/dL (ref 30.0–36.0)
Platelets: 388 10*3/uL (ref 150–400)
RDW: 14.8 % (ref 11.5–15.5)

## 2010-07-31 LAB — URINE CULTURE

## 2010-08-04 NOTE — H&P (Signed)
NAME:  Richard Anderson, Richard Anderson NO.:  [id number]  MEDICAL RECORD NO.:  [id number]          PATIENT TYPE:  INP  LOCATION:  1826                         FACILITY:  [facility]  PHYSICIAN:  Richarda Overlie, MD       DATE OF BIRTH:  January 01, 1971  DATE OF ADMISSION:  07/06/2010 DATE OF DISCHARGE:                             HISTORY & PHYSICAL   PRIMARY CARE PHYSICIAN:  Unassigned.  PRIMARY PSYCHIATRIST:  Marlis Edelson, DO.  CHIEF COMPLAINT:  Abdominal pain, nausea, vomiting and diarrhea.  SUBJECTIVE:  This is an 40 year old male who presents to the ER with chief complaint of nausea, vomiting, abdominal pain for the last 2 days. The patient also complains of significant insomnia, inability to sleep, which has chronic, but has been worsened over the last few days.  Over the last couple of days, the patient has had multiple episodes of nausea and states that every time he eats or drinks something, he has to run to the bathroom.  His stool is described as green, foul-smelling stools without any associated bladder, melena or hematochezia.  The patient denies any recent weight loss or weight gain.  He has never had any history of pancreatitis.  He was recently started on Abilify, Zanaflex and he denies use of alcohol currently, but does have a history of alcohol abuse in the past.  The patient denies any fever, chills or rigors at home.  PAST MEDICAL HISTORY: 1. History of asthma. 2. Hypertension. 3. Alcohol abuse. 4. Depression. 5. Morbid obesity. 6. Polysubstance abuse. 7. Recent psychiatric admissions for depression and suicidal thoughts     and discharged on June 28, 2010 by Dr. Huston Foley.  PAST SURGICAL HISTORY:  None.  CURRENT MEDICATIONS:  Abilify, albuterol, Ambien, vitamin B12, Vicodin, Advair, ibuprofen, Zanaflex, lisinopril, hydroxyzine, gabapentin, folic acid, Zoloft.  PAST SURGICAL HISTORY:  Arthroscopic surgery of the left knee and ACL repair, left knee.  He has a  cyst removed from his right axilla. Appendectomy and tonsillectomy.  FAMILY HISTORY:  Father died in a car accident and had a kidney disease. Mother died 3 years ago for heart attack.  REVIEW OF SYSTEMS:  Complete review of systems was done as documented in HPI.  PHYSICAL EXAMINATION:  VITAL SIGNS:  Blood pressure 135/63, pulse 83, respirations 18, temperature 97.5. GENERAL:  Comfortable currently, in acute no acute cardiopulmonary distress.  Appears to be very anxious and agitated. HEENT:  Pupils equal and reactive.  Extraocular movements intact. NECK: Supple.  No JVD. LUNGS:  Clear to auscultation bilaterally.  No wheezes or crackles or rhonchi. CARDIOVASCULAR:  Regular rate and rhythm.  No murmurs, rubs or gallops. ABDOMEN:  Obese, soft, but tender to palpation in the epigastric region without any right or left upper quadrant tenderness.  No rebound.  No guarding. EXTREMITIES:  Without cyanosis, clubbing or edema. NEUROLOGIC:  Cranial nerves II-XII grossly intact. PSYCHIATRIC:  Very anxious and agitated. LYMPHATIC:  No axillary, inguinal, or cervical lymphadenopathy.  LABS:  Lipase 99.  CMP sodium 139, potassium 4.0, chloride 109, bicarb 25, glucose 112, BUN 14, creatinine 0.91, T-bili 0.3, AST 24, ALT 30, total protein 6.2, calcium  8.5.  CBC WBC 12.0, hemoglobin 12.2, hematocrit of 39.5 and platelet count of 289.  ASSESSMENT AND PLAN: 1. Nausea, vomiting, diarrhea appears to be a viral gastroenteritis     and the elevated lipase is probably coming from the small bowel.     He does have a history of alcohol use in the past.  However, he     states that he has been drinking occasionally and is not binge     drinking.  He denies any history of gallstones.  He was recently     started on Abilify, Zanaflex and which can potentially cause     pancreatitis, but the incidence is less than 1%.  We will obtain a     CT abdomen, pelvis with p.o. and IV contrast to rule out      pancreatitis.  Also, the fact that the patient is having diarrhea     could be related to underlying C diff colitis given his recent     hospitalization.  Therefore, we will check a C diff stool PCR. 2. The patient complaining of left lower extremity edema, which is     unilateral for the last 3 or 4 days.  We will do a bilateral     Doppler ultrasound to rule out a deep venous thrombosis. 3. History of polysubstance abuse, alcohol abuse.  We will check a UDS     and EtOH level and if the patient starts to shows symptoms of     withdrawal, we will place him on CIWA protocol. 4. Ongoing nicotine dependence.  We will provide the patient with a     nicotine patch. 5. Diarrhea.  Check stool for C diff, PCR empirically.  Start him on     IV Flagyl.  DISPOSITION:  He is a full code.     Richarda Overlie, MD     NA/MEDQ  D:  07/06/2010  T:  07/06/2010  Job:  [id number]  Electronically Signed by Richarda Overlie MD on 08/04/2010 07:35:01 AM

## 2010-08-13 ENCOUNTER — Emergency Department (HOSPITAL_COMMUNITY)
Admission: EM | Admit: 2010-08-13 | Discharge: 2010-08-16 | Disposition: A | Discharge status: Other Acute Inpatient Hospital | Payer: Self-pay | Attending: Emergency Medicine | Admitting: Emergency Medicine

## 2010-08-13 DIAGNOSIS — E669 Obesity, unspecified: Secondary | ICD-10-CM | POA: Insufficient documentation

## 2010-08-13 DIAGNOSIS — I10 Essential (primary) hypertension: Secondary | ICD-10-CM | POA: Insufficient documentation

## 2010-08-13 DIAGNOSIS — F3289 Other specified depressive episodes: Secondary | ICD-10-CM | POA: Insufficient documentation

## 2010-08-13 DIAGNOSIS — F329 Major depressive disorder, single episode, unspecified: Secondary | ICD-10-CM | POA: Insufficient documentation

## 2010-08-13 DIAGNOSIS — R45851 Suicidal ideations: Secondary | ICD-10-CM | POA: Insufficient documentation

## 2010-08-13 LAB — DIFFERENTIAL
Eosinophils Absolute: 0.2 10*3/uL (ref 0.0–0.7)
Eosinophils Relative: 2 % (ref 0–5)
Lymphs Abs: 2.2 10*3/uL (ref 0.7–4.0)
Monocytes Absolute: 0.8 10*3/uL (ref 0.1–1.0)
Monocytes Relative: 9 % (ref 3–12)

## 2010-08-13 LAB — ETHANOL: Alcohol, Ethyl (B): <5 mg/dL (ref 0–10)

## 2010-08-13 LAB — CBC
MCH: 26.9 pg (ref 26.0–34.0)
MCV: 85.3 fL (ref 78.0–100.0)
Platelets: 341 10*3/uL (ref 150–400)
RDW: 14.8 % (ref 11.5–15.5)

## 2010-08-13 LAB — COMPREHENSIVE METABOLIC PANEL
Albumin: 3.6 g/dL (ref 3.5–5.2)
BUN: 16 mg/dL (ref 6–23)
Creatinine, Ser: 1.02 mg/dL (ref 0.4–1.5)
Glucose, Bld: 109 mg/dL — ABNORMAL HIGH (ref 70–99)
Total Bilirubin: 0.3 mg/dL (ref 0.3–1.2)
Total Protein: 6.8 g/dL (ref 6.0–8.3)

## 2010-08-13 LAB — RAPID URINE DRUG SCREEN, HOSP PERFORMED
Barbiturates: NOT DETECTED
Cocaine: NOT DETECTED
Opiates: POSITIVE — AB

## 2010-08-14 DIAGNOSIS — F39 Unspecified mood [affective] disorder: Secondary | ICD-10-CM

## 2010-08-15 DIAGNOSIS — F39 Unspecified mood [affective] disorder: Secondary | ICD-10-CM

## 2010-08-16 DIAGNOSIS — F39 Unspecified mood [affective] disorder: Secondary | ICD-10-CM

## 2010-08-20 LAB — DIFFERENTIAL
Basophils Relative: 0 % (ref 0–1)
Eosinophils Absolute: 0.2 10*3/uL (ref 0.0–0.7)
Eosinophils Relative: 2 % (ref 0–5)
Lymphs Abs: 1.8 10*3/uL (ref 0.7–4.0)
Monocytes Absolute: 1 10*3/uL (ref 0.1–1.0)
Monocytes Relative: 9 % (ref 3–12)

## 2010-08-20 LAB — URINALYSIS, ROUTINE W REFLEX MICROSCOPIC
Bilirubin Urine: NEGATIVE
Glucose, UA: NEGATIVE mg/dL
Glucose, UA: NEGATIVE mg/dL
Hgb urine dipstick: NEGATIVE
Nitrite: NEGATIVE
Protein, ur: >300 mg/dL — AB
Protein, ur: NEGATIVE mg/dL
Urobilinogen, UA: 0.2 mg/dL (ref 0.0–1.0)
pH: 6 (ref 5.0–8.0)

## 2010-08-20 LAB — BASIC METABOLIC PANEL
CO2: 26 mEq/L (ref 19–32)
Calcium: 8.7 mg/dL (ref 8.4–10.5)
Chloride: 104 mEq/L (ref 96–112)
GFR calc Af Amer: >60 mL/min (ref >60)
Glucose, Bld: 97 mg/dL (ref 70–99)
Potassium: 3.7 mEq/L (ref 3.5–5.1)
Sodium: 138 mEq/L (ref 135–145)

## 2010-08-20 LAB — CBC
HCT: 42.1 % (ref 39.0–52.0)
Hemoglobin: 13.4 g/dL (ref 13.0–17.0)
MCH: 27.9 pg (ref 26.0–34.0)
MCHC: 31.8 g/dL (ref 30.0–36.0)
MCV: 87.5 fL (ref 78.0–100.0)
RBC: 4.81 MIL/uL (ref 4.22–5.81)

## 2010-08-20 LAB — URINE MICROSCOPIC-ADD ON

## 2010-08-20 LAB — URINE CULTURE
Colony Count: NO GROWTH
Culture: NO GROWTH

## 2010-08-21 LAB — COMPREHENSIVE METABOLIC PANEL
BUN: 9 mg/dL (ref 6–23)
CO2: 28 mEq/L (ref 19–32)
Calcium: 9.2 mg/dL (ref 8.4–10.5)
Chloride: 108 mEq/L (ref 96–112)
Creatinine, Ser: 0.92 mg/dL (ref 0.4–1.5)
GFR calc Af Amer: >60 mL/min (ref >60)
GFR calc non Af Amer: >60 mL/min (ref >60)
Total Bilirubin: 0.3 mg/dL (ref 0.3–1.2)

## 2010-08-21 LAB — CBC
Hemoglobin: 13.7 g/dL (ref 13.0–17.0)
MCH: 29.1 pg (ref 26.0–34.0)
MCHC: 32.9 g/dL (ref 30.0–36.0)
MCV: 88.3 fL (ref 78.0–100.0)
Platelets: 286 10*3/uL (ref 150–400)
RBC: 4.71 MIL/uL (ref 4.22–5.81)

## 2010-08-21 LAB — POCT I-STAT, CHEM 8
Calcium, Ion: 1.12 mmol/L (ref 1.12–1.32)
Chloride: 107 mEq/L (ref 96–112)
HCT: 43 % (ref 39.0–52.0)
Potassium: 3.9 mEq/L (ref 3.5–5.1)
Sodium: 143 mEq/L (ref 135–145)

## 2010-08-21 LAB — URINE CULTURE
Culture  Setup Time: 201110191838
Culture  Setup Time: 201110312052

## 2010-08-21 LAB — RAPID URINE DRUG SCREEN, HOSP PERFORMED
Amphetamines: NOT DETECTED
Barbiturates: NOT DETECTED
Benzodiazepines: NOT DETECTED
Cocaine: NOT DETECTED

## 2010-08-21 LAB — URINALYSIS, ROUTINE W REFLEX MICROSCOPIC
Bilirubin Urine: NEGATIVE
Glucose, UA: NEGATIVE mg/dL
Glucose, UA: NEGATIVE mg/dL
Hgb urine dipstick: NEGATIVE
Ketones, ur: NEGATIVE mg/dL
Ketones, ur: NEGATIVE mg/dL
Protein, ur: NEGATIVE mg/dL
Urobilinogen, UA: 0.2 mg/dL (ref 0.0–1.0)
pH: 6 (ref 5.0–8.0)

## 2010-08-21 LAB — ETHANOL: Alcohol, Ethyl (B): <5 mg/dL (ref 0–10)

## 2010-08-21 LAB — URINE MICROSCOPIC-ADD ON

## 2010-08-21 LAB — DIFFERENTIAL
Basophils Absolute: 0 10*3/uL (ref 0.0–0.1)
Lymphocytes Relative: 21 % (ref 12–46)
Lymphs Abs: 1.7 10*3/uL (ref 0.7–4.0)
Neutro Abs: 5 10*3/uL (ref 1.7–7.7)

## 2010-08-21 LAB — RAPID STREP SCREEN (MED CTR MEBANE ONLY): Streptococcus, Group A Screen (Direct): NEGATIVE

## 2010-08-22 LAB — URINALYSIS, ROUTINE W REFLEX MICROSCOPIC
Bilirubin Urine: NEGATIVE
Bilirubin Urine: NEGATIVE
Glucose, UA: NEGATIVE mg/dL
Glucose, UA: NEGATIVE mg/dL
Ketones, ur: NEGATIVE mg/dL
Nitrite: NEGATIVE
Specific Gravity, Urine: 1.02 (ref 1.005–1.030)
Specific Gravity, Urine: 1.035 — ABNORMAL HIGH (ref 1.005–1.030)
pH: 7 (ref 5.0–8.0)
pH: 7.5 (ref 5.0–8.0)
pH: 5.5 (ref 5.0–8.0)

## 2010-08-22 LAB — COMPREHENSIVE METABOLIC PANEL
AST: 31 U/L (ref 0–37)
CO2: 30 mEq/L (ref 19–32)
Chloride: 105 mEq/L (ref 96–112)
Creatinine, Ser: 0.97 mg/dL (ref 0.4–1.5)
GFR calc Af Amer: >60 mL/min (ref >60)
GFR calc non Af Amer: >60 mL/min (ref >60)
Glucose, Bld: 98 mg/dL (ref 70–99)
Total Bilirubin: 0.3 mg/dL (ref 0.3–1.2)

## 2010-08-22 LAB — DIFFERENTIAL
Basophils Absolute: 0 10*3/uL (ref 0.0–0.1)
Basophils Absolute: 0 10*3/uL (ref 0.0–0.1)
Basophils Relative: 0 % (ref 0–1)
Eosinophils Absolute: 0.1 10*3/uL (ref 0.0–0.7)
Eosinophils Relative: 1 % (ref 0–5)
Lymphocytes Relative: 12 % (ref 12–46)
Lymphocytes Relative: 19 % (ref 12–46)
Lymphs Abs: 1.6 K/uL (ref 0.7–4.0)
Monocytes Absolute: 0.7 K/uL (ref 0.1–1.0)
Monocytes Relative: 5 % (ref 3–12)
Neutro Abs: 11.2 10*3/uL — ABNORMAL HIGH (ref 1.7–7.7)
Neutro Abs: 5.8 10*3/uL (ref 1.7–7.7)
Neutrophils Relative %: 82 % — ABNORMAL HIGH (ref 43–77)

## 2010-08-22 LAB — RAPID URINE DRUG SCREEN, HOSP PERFORMED
Amphetamines: NOT DETECTED
Barbiturates: NOT DETECTED
Benzodiazepines: POSITIVE — AB
Cocaine: NOT DETECTED
Opiates: NOT DETECTED
Tetrahydrocannabinol: POSITIVE — AB

## 2010-08-22 LAB — URINE MICROSCOPIC-ADD ON

## 2010-08-22 LAB — BASIC METABOLIC PANEL
BUN: 18 mg/dL (ref 6–23)
Calcium: 8.8 mg/dL (ref 8.4–10.5)
Creatinine, Ser: 0.9 mg/dL (ref 0.4–1.5)
Creatinine, Ser: 1.02 mg/dL (ref 0.4–1.5)
GFR calc non Af Amer: >60 mL/min (ref >60)
GFR calc non Af Amer: >60 mL/min (ref >60)
Glucose, Bld: 100 mg/dL — ABNORMAL HIGH (ref 70–99)
Potassium: 4.3 mEq/L (ref 3.5–5.1)
Sodium: 141 mEq/L (ref 135–145)

## 2010-08-22 LAB — CBC
HCT: 37.8 % — ABNORMAL LOW (ref 39.0–52.0)
HCT: 43.1 % (ref 39.0–52.0)
Hemoglobin: 14.6 g/dL (ref 13.0–17.0)
Hemoglobin: 13.1 g/dL (ref 13.0–17.0)
MCH: 29.8 pg (ref 26.0–34.0)
MCH: 29.7 pg (ref 26.0–34.0)
MCHC: 33.9 g/dL (ref 30.0–36.0)
MCHC: 34.3 g/dL (ref 30.0–36.0)
MCV: 87.7 fL (ref 78.0–100.0)
Platelets: 297 10*3/uL (ref 150–400)
Platelets: 305 10*3/uL (ref 150–400)
Platelets: 335 10*3/uL (ref 150–400)
RBC: 4.92 MIL/uL (ref 4.22–5.81)
RBC: 4.4 MIL/uL (ref 4.22–5.81)
RDW: 13.9 % (ref 11.5–15.5)
RDW: 14.1 % (ref 11.5–15.5)
WBC: 13.6 K/uL — ABNORMAL HIGH (ref 4.0–10.5)
WBC: 8.4 10*3/uL (ref 4.0–10.5)

## 2010-08-22 LAB — BASIC METABOLIC PANEL WITH GFR
BUN: 15 mg/dL (ref 6–23)
CO2: 26 meq/L (ref 19–32)
Chloride: 108 meq/L (ref 96–112)
GFR calc Af Amer: >60 mL/min (ref >60)
Potassium: 4 meq/L (ref 3.5–5.1)

## 2010-08-22 LAB — HEPATIC FUNCTION PANEL
AST: 17 U/L (ref 0–37)
Bilirubin, Direct: 0.1 mg/dL (ref 0.0–0.3)
Indirect Bilirubin: 0.1 mg/dL — ABNORMAL LOW (ref 0.3–0.9)
Total Bilirubin: 0.2 mg/dL — ABNORMAL LOW (ref 0.3–1.2)

## 2010-08-22 LAB — TRICYCLICS SCREEN, URINE: TCA Scrn: NOT DETECTED

## 2010-08-22 LAB — ETHANOL: Alcohol, Ethyl (B): <5 mg/dL (ref 0–10)

## 2010-08-24 ENCOUNTER — Emergency Department (HOSPITAL_COMMUNITY)
Admission: EM | Admit: 2010-08-24 | Discharge: 2010-08-24 | Disposition: A | Discharge status: Home / Self Care | Payer: Self-pay | Attending: Emergency Medicine | Admitting: Emergency Medicine

## 2010-08-24 ENCOUNTER — Emergency Department (HOSPITAL_COMMUNITY): Payer: Self-pay

## 2010-08-24 DIAGNOSIS — I10 Essential (primary) hypertension: Secondary | ICD-10-CM | POA: Insufficient documentation

## 2010-08-24 DIAGNOSIS — E669 Obesity, unspecified: Secondary | ICD-10-CM | POA: Insufficient documentation

## 2010-08-24 DIAGNOSIS — E119 Type 2 diabetes mellitus without complications: Secondary | ICD-10-CM | POA: Insufficient documentation

## 2010-08-24 DIAGNOSIS — Z79899 Other long term (current) drug therapy: Secondary | ICD-10-CM | POA: Insufficient documentation

## 2010-08-24 DIAGNOSIS — J45909 Unspecified asthma, uncomplicated: Secondary | ICD-10-CM | POA: Insufficient documentation

## 2010-08-24 DIAGNOSIS — S8000XA Contusion of unspecified knee, initial encounter: Secondary | ICD-10-CM | POA: Insufficient documentation

## 2010-08-24 DIAGNOSIS — R000 Tachycardia, unspecified: Secondary | ICD-10-CM | POA: Insufficient documentation

## 2010-08-24 DIAGNOSIS — M25579 Pain in unspecified ankle and joints of unspecified foot: Secondary | ICD-10-CM | POA: Insufficient documentation

## 2010-08-24 DIAGNOSIS — M25476 Effusion, unspecified foot: Secondary | ICD-10-CM | POA: Insufficient documentation

## 2010-08-24 DIAGNOSIS — M25473 Effusion, unspecified ankle: Secondary | ICD-10-CM | POA: Insufficient documentation

## 2010-08-24 DIAGNOSIS — W010XXA Fall on same level from slipping, tripping and stumbling without subsequent striking against object, initial encounter: Secondary | ICD-10-CM | POA: Insufficient documentation

## 2010-08-25 LAB — CBC
HCT: 43.7 % (ref 39.0–52.0)
MCHC: 33.8 g/dL (ref 30.0–36.0)
Platelets: 322 10*3/uL (ref 150–400)
RDW: 14.3 % (ref 11.5–15.5)

## 2010-08-25 LAB — DIFFERENTIAL
Basophils Absolute: 0.1 10*3/uL (ref 0.0–0.1)
Basophils Relative: 1 % (ref 0–1)
Eosinophils Relative: 2 % (ref 0–5)
Monocytes Absolute: 0.9 10*3/uL (ref 0.1–1.0)
Neutro Abs: 8.6 10*3/uL — ABNORMAL HIGH (ref 1.7–7.7)

## 2010-08-25 LAB — BASIC METABOLIC PANEL
BUN: 17 mg/dL (ref 6–23)
Creatinine, Ser: 1.05 mg/dL (ref 0.4–1.5)
GFR calc non Af Amer: >60 mL/min (ref >60)
Glucose, Bld: 115 mg/dL — ABNORMAL HIGH (ref 70–99)
Potassium: 3.6 mEq/L (ref 3.5–5.1)

## 2010-08-25 LAB — POCT I-STAT, CHEM 8
Calcium, Ion: 1.13 mmol/L (ref 1.12–1.32)
Chloride: 111 mEq/L (ref 96–112)
HCT: 41 % (ref 39.0–52.0)
Potassium: 3.6 mEq/L (ref 3.5–5.1)

## 2010-08-25 LAB — URINALYSIS, ROUTINE W REFLEX MICROSCOPIC
Bilirubin Urine: NEGATIVE
Glucose, UA: NEGATIVE mg/dL
Ketones, ur: NEGATIVE mg/dL
pH: 6 (ref 5.0–8.0)

## 2010-08-25 LAB — POCT CARDIAC MARKERS
Myoglobin, poc: 141 ng/mL (ref 12–200)
Troponin i, poc: <0.05 ng/mL (ref 0.00–0.09)

## 2010-08-25 LAB — RAPID URINE DRUG SCREEN, HOSP PERFORMED: Cocaine: NOT DETECTED

## 2010-08-25 LAB — URINE MICROSCOPIC-ADD ON

## 2010-08-26 LAB — URINALYSIS, ROUTINE W REFLEX MICROSCOPIC
Bilirubin Urine: NEGATIVE
Glucose, UA: NEGATIVE mg/dL
Hgb urine dipstick: NEGATIVE
Ketones, ur: 15 mg/dL — AB
Ketones, ur: NEGATIVE mg/dL
Nitrite: NEGATIVE
Nitrite: NEGATIVE
Protein, ur: NEGATIVE mg/dL
Specific Gravity, Urine: 1.008 (ref 1.005–1.030)
Specific Gravity, Urine: 1.022 (ref 1.005–1.030)
Urobilinogen, UA: 1 mg/dL (ref 0.0–1.0)
pH: 6 (ref 5.0–8.0)
pH: 7.5 (ref 5.0–8.0)

## 2010-08-26 LAB — URINE MICROSCOPIC-ADD ON

## 2010-08-28 LAB — COMPREHENSIVE METABOLIC PANEL
AST: 17 U/L (ref 0–37)
Albumin: 3.3 g/dL — ABNORMAL LOW (ref 3.5–5.2)
Alkaline Phosphatase: 44 U/L (ref 39–117)
BUN: 10 mg/dL (ref 6–23)
BUN: 13 mg/dL (ref 6–23)
CO2: 28 mEq/L (ref 19–32)
Chloride: 107 mEq/L (ref 96–112)
Chloride: 107 mEq/L (ref 96–112)
Creatinine, Ser: 0.92 mg/dL (ref 0.4–1.5)
Creatinine, Ser: 1.02 mg/dL (ref 0.4–1.5)
GFR calc Af Amer: >60 mL/min (ref >60)
GFR calc non Af Amer: >60 mL/min (ref >60)
Potassium: 4.1 mEq/L (ref 3.5–5.1)
Total Bilirubin: 0.4 mg/dL (ref 0.3–1.2)
Total Bilirubin: 0.4 mg/dL (ref 0.3–1.2)
Total Protein: 6.4 g/dL (ref 6.0–8.3)

## 2010-08-28 LAB — POCT I-STAT, CHEM 8
Calcium, Ion: 1.15 mmol/L (ref 1.12–1.32)
HCT: 46 % (ref 39.0–52.0)
Hemoglobin: 14.3 g/dL (ref 13.0–17.0)
Hemoglobin: 15.6 g/dL (ref 13.0–17.0)
Potassium: 3.7 mEq/L (ref 3.5–5.1)
Sodium: 140 mEq/L (ref 135–145)
Sodium: 142 mEq/L (ref 135–145)
TCO2: 24 mmol/L (ref 0–100)
TCO2: 31 mmol/L (ref 0–100)

## 2010-08-28 LAB — CBC
HCT: 41 % (ref 39.0–52.0)
HCT: 41.8 % (ref 39.0–52.0)
Hemoglobin: 14.4 g/dL (ref 13.0–17.0)
MCV: 87.2 fL (ref 78.0–100.0)
MCV: 87.9 fL (ref 78.0–100.0)
Platelets: 269 10*3/uL (ref 150–400)
Platelets: 327 10*3/uL (ref 150–400)
RBC: 4.76 MIL/uL (ref 4.22–5.81)
RDW: 13.6 % (ref 11.5–15.5)
WBC: 8.5 10*3/uL (ref 4.0–10.5)
WBC: 9.7 10*3/uL (ref 4.0–10.5)

## 2010-08-28 LAB — URINE MICROSCOPIC-ADD ON

## 2010-08-28 LAB — STOOL CULTURE

## 2010-08-28 LAB — DIFFERENTIAL
Basophils Absolute: 0 10*3/uL (ref 0.0–0.1)
Eosinophils Absolute: 0.1 10*3/uL (ref 0.0–0.7)
Eosinophils Relative: 1 % (ref 0–5)
Eosinophils Relative: 2 % (ref 0–5)
Lymphocytes Relative: 17 % (ref 12–46)
Lymphocytes Relative: 18 % (ref 12–46)
Lymphs Abs: 1.4 10*3/uL (ref 0.7–4.0)
Lymphs Abs: 1.8 10*3/uL (ref 0.7–4.0)
Monocytes Absolute: 0.8 10*3/uL (ref 0.1–1.0)
Monocytes Absolute: 0.8 10*3/uL (ref 0.1–1.0)
Monocytes Relative: 9 % (ref 3–12)
Monocytes Relative: 9 % (ref 3–12)
Neutro Abs: 6.2 10*3/uL (ref 1.7–7.7)

## 2010-08-28 LAB — URINALYSIS, ROUTINE W REFLEX MICROSCOPIC
Bilirubin Urine: NEGATIVE
Bilirubin Urine: NEGATIVE
Glucose, UA: NEGATIVE mg/dL
Hgb urine dipstick: NEGATIVE
Hgb urine dipstick: NEGATIVE
Ketones, ur: NEGATIVE mg/dL
Ketones, ur: NEGATIVE mg/dL
Nitrite: NEGATIVE
Protein, ur: NEGATIVE mg/dL
Specific Gravity, Urine: 1.018 (ref 1.005–1.030)
Urobilinogen, UA: 0.2 mg/dL (ref 0.0–1.0)
Urobilinogen, UA: 1 mg/dL (ref 0.0–1.0)
pH: 6 (ref 5.0–8.0)
pH: 7.5 (ref 5.0–8.0)

## 2010-08-28 LAB — BASIC METABOLIC PANEL
BUN: 16 mg/dL (ref 6–23)
Chloride: 108 mEq/L (ref 96–112)
Potassium: 3.5 mEq/L (ref 3.5–5.1)
Sodium: 138 mEq/L (ref 135–145)

## 2010-08-28 LAB — CLOSTRIDIUM DIFFICILE EIA

## 2010-08-28 LAB — LIPASE, BLOOD
Lipase: 23 U/L (ref 11–59)
Lipase: 30 U/L (ref 11–59)

## 2010-08-28 LAB — HEMOCCULT GUIAC POC 1CARD (OFFICE)
Fecal Occult Bld: NEGATIVE
Fecal Occult Bld: POSITIVE

## 2010-09-10 LAB — DIFFERENTIAL
Lymphocytes Relative: 15 % (ref 12–46)
Lymphs Abs: 1.7 10*3/uL (ref 0.7–4.0)
Monocytes Absolute: 0.7 10*3/uL (ref 0.1–1.0)
Monocytes Relative: 7 % (ref 3–12)
Neutro Abs: 8.5 10*3/uL — ABNORMAL HIGH (ref 1.7–7.7)
Neutrophils Relative %: 76 % (ref 43–77)

## 2010-09-10 LAB — COMPREHENSIVE METABOLIC PANEL
Albumin: 3.2 g/dL — ABNORMAL LOW (ref 3.5–5.2)
Alkaline Phosphatase: 42 U/L (ref 39–117)
BUN: 18 mg/dL (ref 6–23)
Calcium: 8.3 mg/dL — ABNORMAL LOW (ref 8.4–10.5)
Glucose, Bld: 95 mg/dL (ref 70–99)
Potassium: 3.7 mEq/L (ref 3.5–5.1)
Total Protein: 5.9 g/dL — ABNORMAL LOW (ref 6.0–8.3)

## 2010-09-10 LAB — CBC
HCT: 43.5 % (ref 39.0–52.0)
Hemoglobin: 14.9 g/dL (ref 13.0–17.0)
MCHC: 34.2 g/dL (ref 30.0–36.0)
Platelets: 282 10*3/uL (ref 150–400)
RDW: 13.8 % (ref 11.5–15.5)

## 2010-09-10 LAB — POCT CARDIAC MARKERS
CKMB, poc: 1.8 ng/mL (ref 1.0–8.0)
Troponin i, poc: <0.05 ng/mL (ref 0.00–0.09)

## 2010-09-19 ENCOUNTER — Emergency Department (HOSPITAL_COMMUNITY): Payer: Self-pay

## 2010-09-19 ENCOUNTER — Emergency Department (HOSPITAL_COMMUNITY)
Admission: EM | Admit: 2010-09-19 | Discharge: 2010-09-20 | Disposition: A | Discharge status: Home / Self Care | Payer: Self-pay | Attending: Emergency Medicine | Admitting: Emergency Medicine

## 2010-09-19 DIAGNOSIS — I498 Other specified cardiac arrhythmias: Secondary | ICD-10-CM | POA: Insufficient documentation

## 2010-09-19 DIAGNOSIS — R112 Nausea with vomiting, unspecified: Secondary | ICD-10-CM | POA: Insufficient documentation

## 2010-09-19 DIAGNOSIS — J45909 Unspecified asthma, uncomplicated: Secondary | ICD-10-CM | POA: Insufficient documentation

## 2010-09-19 DIAGNOSIS — E119 Type 2 diabetes mellitus without complications: Secondary | ICD-10-CM | POA: Insufficient documentation

## 2010-09-19 DIAGNOSIS — R197 Diarrhea, unspecified: Secondary | ICD-10-CM | POA: Insufficient documentation

## 2010-09-19 DIAGNOSIS — R109 Unspecified abdominal pain: Secondary | ICD-10-CM | POA: Insufficient documentation

## 2010-09-19 DIAGNOSIS — I10 Essential (primary) hypertension: Secondary | ICD-10-CM | POA: Insufficient documentation

## 2010-09-19 HISTORY — DX: Essential (primary) hypertension: I10

## 2010-09-19 LAB — CBC
HCT: 44.5 % (ref 39.0–52.0)
Hemoglobin: 14.5 g/dL (ref 13.0–17.0)
MCH: 27.4 pg (ref 26.0–34.0)
MCHC: 32.6 g/dL (ref 30.0–36.0)
MCV: 84 fL (ref 78.0–100.0)
RBC: 5.3 MIL/uL (ref 4.22–5.81)

## 2010-09-19 LAB — POCT CARDIAC MARKERS: Troponin i, poc: <0.05 ng/mL (ref 0.00–0.09)

## 2010-09-19 LAB — COMPREHENSIVE METABOLIC PANEL
ALT: 39 U/L (ref 0–53)
AST: 22 U/L (ref 0–37)
Albumin: 3.6 g/dL (ref 3.5–5.2)
Calcium: 8.7 mg/dL (ref 8.4–10.5)
Chloride: 107 mEq/L (ref 96–112)
Creatinine, Ser: 0.92 mg/dL (ref 0.4–1.5)
GFR calc Af Amer: >60 mL/min (ref >60)
Sodium: 139 mEq/L (ref 135–145)
Total Bilirubin: 0.4 mg/dL (ref 0.3–1.2)

## 2010-09-19 LAB — DIFFERENTIAL
Basophils Relative: 0 % (ref 0–1)
Lymphocytes Relative: 22 % (ref 12–46)
Lymphs Abs: 2.7 10*3/uL (ref 0.7–4.0)
Monocytes Absolute: 1.1 10*3/uL — ABNORMAL HIGH (ref 0.1–1.0)
Monocytes Relative: 9 % (ref 3–12)
Neutro Abs: 8.1 10*3/uL — ABNORMAL HIGH (ref 1.7–7.7)
Neutrophils Relative %: 67 % (ref 43–77)

## 2010-09-20 ENCOUNTER — Encounter (HOSPITAL_COMMUNITY): Payer: Self-pay | Admitting: Radiology

## 2010-09-20 LAB — URINALYSIS, ROUTINE W REFLEX MICROSCOPIC
Bilirubin Urine: NEGATIVE
Ketones, ur: NEGATIVE mg/dL
Nitrite: NEGATIVE
Urobilinogen, UA: 0.2 mg/dL (ref 0.0–1.0)
pH: 5.5 (ref 5.0–8.0)

## 2010-09-20 MED ORDER — IOHEXOL 300 MG/ML  SOLN
100.0000 mL | Freq: Once | INTRAMUSCULAR | Status: AC | PRN
  Administered 2010-09-20: 100 mL via INTRAVENOUS

## 2010-09-23 ENCOUNTER — Emergency Department (HOSPITAL_COMMUNITY)
Admission: EM | Admit: 2010-09-23 | Discharge: 2010-09-23 | Disposition: A | Discharge status: Home / Self Care | Payer: Self-pay | Attending: Emergency Medicine | Admitting: Emergency Medicine

## 2010-09-23 DIAGNOSIS — R197 Diarrhea, unspecified: Secondary | ICD-10-CM | POA: Insufficient documentation

## 2010-09-23 DIAGNOSIS — E119 Type 2 diabetes mellitus without complications: Secondary | ICD-10-CM | POA: Insufficient documentation

## 2010-09-23 DIAGNOSIS — J45909 Unspecified asthma, uncomplicated: Secondary | ICD-10-CM | POA: Insufficient documentation

## 2010-09-23 DIAGNOSIS — I10 Essential (primary) hypertension: Secondary | ICD-10-CM | POA: Insufficient documentation

## 2010-09-23 DIAGNOSIS — R112 Nausea with vomiting, unspecified: Secondary | ICD-10-CM | POA: Insufficient documentation

## 2010-09-23 DIAGNOSIS — G8929 Other chronic pain: Secondary | ICD-10-CM | POA: Insufficient documentation

## 2010-09-23 DIAGNOSIS — R109 Unspecified abdominal pain: Secondary | ICD-10-CM | POA: Insufficient documentation

## 2010-10-08 ENCOUNTER — Emergency Department (HOSPITAL_COMMUNITY)
Admission: EM | Admit: 2010-10-08 | Discharge: 2010-10-08 | Disposition: A | Discharge status: Home / Self Care | Payer: Self-pay | Attending: Emergency Medicine | Admitting: Emergency Medicine

## 2010-10-08 ENCOUNTER — Emergency Department (HOSPITAL_COMMUNITY): Payer: Self-pay

## 2010-10-08 DIAGNOSIS — I10 Essential (primary) hypertension: Secondary | ICD-10-CM | POA: Insufficient documentation

## 2010-10-08 DIAGNOSIS — R0602 Shortness of breath: Secondary | ICD-10-CM | POA: Insufficient documentation

## 2010-10-08 DIAGNOSIS — J40 Bronchitis, not specified as acute or chronic: Secondary | ICD-10-CM | POA: Insufficient documentation

## 2010-10-08 DIAGNOSIS — R062 Wheezing: Secondary | ICD-10-CM | POA: Insufficient documentation

## 2010-10-22 NOTE — Discharge Summary (Signed)
NAME:  Richard Anderson, HANDS NO.:  [id number]   MEDICAL RECORD NO.:  [id number]          PATIENT TYPE:  IPS   LOCATION:  0507                          FACILITY:  [facility]   PHYSICIAN:  Geoffery Lyons, M.D.      DATE OF BIRTH:  [birth year]   DATE OF ADMISSION:  06/18/2007  DATE OF DISCHARGE:  06/25/2007                               DISCHARGE SUMMARY   HISTORY:  This was the first admission to [facility] for  this 40 year old single white male presented as a walk-in.  Reported  that he had become quite depressed, developing suicidal thoughts after  the sudden death of his mother 4 months prior to this admission from a  massive heart attack.  She was almost 60 and a month later his  great-grandmother died, she was 106.  The patient stated after his  mother's death, he relapsed on drugs and alcohol.  He had been sober  prior to that for 6-1/2 years.  Recently, he had been using alcohol,  weed, pain killers and some heroin.  He had taken drugs 2 days prior to  this admission as a suicide attempt, 12 Percocet that he had bought on  the street.  He claims while in the hospital, he was going to try to  kill himself again.   PAST MEDICAL HISTORY:  He underwent detoxification for alcohol and  cocaine 7-1/2 years ago in [place].  He also went to a halfway  house for awhile after that and became sober.  Maintained sobriety until  4 months prior to this admission after his mother's death.  Secondary  history, he began using alcohol at around age 67 and has been using  marijuana age 48.  Early 20s, he started using Xanax, Valium and  Percocet.  Last 4 months, he has been using alcohol, fifth of rum almost  daily, marijuana, pain pills, Xanax, Valium, Percocet.   MEDICAL HISTORY:  Asthma.   MEDICATIONS:  Albuterol inhaler.   PHYSICAL EXAMINATION:  In the ED, failed to show any acute findings.  Mental status exam reveals alert, cooperative male, unkempt.  Speech was  normal rate, tempo and production.  Mood depressed.  Affect was labile,  cries easily.  Thought processes are clear, rational and goal oriented.  Willing to get treatment.  Admits to suicidal ideations.  Admits to  sense of hopelessness and helplessness, not knowing if he can deal with  the death of the mother, if he can go on.  No delusions.  No  hallucinations.  Cognition well preserved.   LABORATORY WORK:  UDS positive for opiates.   ADMISSION DIAGNOSES:  AXIS I:  Opiate dependence, polysubstance  dependence, major depressive disorder.  AXIS II:  No diagnosis.  AXIS III:  Asthma and obesity.  AXIS IV:  Moderate.  AXIS V:  On admission 30, in the last year 70.   COURSE IN THE HOSPITAL:  He was admitted.  He was started in individual  and group psychotherapy.  We detoxified with clonidine and Librium.  Started on Celexa.  Then he had trials with Seroquel, Zyprexa  as well as  Neurontin.  As already stated, he endorsed that he relapsed after the  death of his mother.  They were very close.  He spoke with her the night  before and she was okay.  Next day, she had a massive heart attack.  Endorsed he could not deal with it.  He was 7-1/2 years sober.  He works  2 jobs 85-90 hours a week.  Stated that he was dealing with it by  drinking and working long hours.  Now that he is not intoxicated, feels  that he cannot handle it.  The day of the admission, he drank about a  bottle of tequila with Percocet.  Endorsed opiates, heroin,  benzodiazepine, alcohol and drugs. He is  experiencing all the emotional  and the physical pain, very tearful.  He continued to have a hard time  through most of his stay.  More upset, irritable, angry, agitated,  feeling that he could go off.  Continued to endorse that since his  mother died, he had no reason to go on.  Continued to affirm that he has  been either high or drunk as a way of dealing with the death.  Now  sober, he cannot deal with it.  Endorsed  anxiety, agitation, feeling  overwhelmed, fear of losing control, suicidal ideations.  We tried the  Seroquel, but he continued to have a very hard time.  On June 23, 2007, the family called and the father made him believe that the sister  was going to require an emergency liver transplant and that he was going  to be needed back home to take care of the kids.  This upset him as he  was dealing with the loss of his mother and now he also anticipating the  possible loss of his sister.  He got upset, agitated, loud.  Upon this  assessment, he apologized about the behavior the day before.  We  continued to the medication, work on grief and loss, coping skills.  He  felt that the Zyprexa was better than the Seroquel.   On June 24, 2007, he was more insightful.  Did say that the  depression was getting better, but endorsed he was craving more.  So the  craving as a result of his opening up and talking about the death of his  mother.  We worked on Pharmacologist, defined loss, relapse prevention.  We tried Campral, Symmetrel and Neurontin as well as some anger  management.  He got into some acting out events with another patient,  where he was able to deal with this event.  On June 25, 2007, he was  objectively better.  There were no suicidal or homicidal ideations.  The  depression was better.  He was going to go back to work eventually.  He  may go to back [place]  with the family.  He felt was the better  plan.  He said that he had realized that they both need each other.  Upon discharge, no suicidal or homicidal ideation.   DISCHARGE DIAGNOSES:  AXIS I:  Major depression, opiate dependence,  polysubstance dependence, mood disorder, not otherwise specified.  AXIS II:  No diagnosis.  AXIS III:  Asthma.  AXIS IV:  Moderate.  AXIS V:  Upon discharge 51.   DISCHARGE MEDICATIONS:  ]  1. Celexa 20 mg per day.  2. Catapres-TTS 0.2 one every 7 days.  3. Symmetrel 100 twice a  day.  4.  Campral 233 two-three times a day.  5. Ventolin inhaler 2 puffs every 4 hours.  6. Ambien 10 at bedtime as needed for sleep.  7. Zyprexa 35 one twice a day as needed.   FOLLOW UP:  Follow up with Dr. Lang Snow and [facility].      Geoffery Lyons, M.D.  Electronically Signed     IL/MEDQ  D:  07/12/2007  T:  07/13/2007  Job:  [id number]
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
## 2010-10-22 NOTE — Discharge Summary (Signed)
NAME:  Richard Anderson, BATCH NO.:  [id number]   MEDICAL RECORD NO.:  [id number]          PATIENT TYPE:  INP   LOCATION:  1428                         [facility]ACILITY:  [facility]   PHYSICIAN:  Elliot Cousin, M.D.    DATE OF BIRTH:  06-30-1970   DATE OF ADMISSION:  07/23/2007  DATE OF DISCHARGE:  07/24/2007                               DISCHARGE SUMMARY   DISCHARGE DIAGNOSES:  1. Atypical chest pain.  2. Hypertension.  3. Asthma.  4. Tobacco abuse.  5. Depression.  6. Recent history of dental abscess.   DISCHARGE MEDICATIONS:  1. Clonidine 0.2 mg half a tablet b.i.d.  2. Albuterol inhaler 2 puffs every 4 hours p.r.n.  3. Nicotine patch take as directed.  4. Campral 333 mg 3 times daily.  5. Amantadine 100 mg b.i.d.  6. Celexa 20 mg daily.  7. Zyprexa 35 mg b.i.d.  8. Penicillin, continue 500 mg b.i.d. until completed.   DISCHARGE DISPOSITION:  The patient was discharged to home in improved  and stable condition on July 24, 2007.   FOLLOW UP:  He was advised to follow up with Dr. Reche Dixon at the [facility] as scheduled.   PROCEDURE PERFORMED:  Chest x-ray on July 23, 2007.  The results  revealed minimal peribronchial thickening; no infiltrate or congestive  heart failure.   HISTORY OF PRESENT ILLNESS:  The patient is a 40 year old man with a  past medical history significant for polysubstance abuse, depression,  and asthma.  He presented to the emergency department with a chief  complaint of chest pain.  When the patient was evaluated in the  emergency department,  he was noted to be hypertensive.  His EKG  revealed normal sinus rhythm with no acute changes.  His chest x-ray  revealed mild peribronchial thickening.  However because of his risk  factors, he was admitted for further evaluation and management.   For additional details, please see the dictated history and physical.   HOSPITAL COURSE:  1. ATYPICAL CHEST PAIN.  The patient was started  empirically on      aspirin therapy.  Clonidine at 0.1 mg b.i.d. was restarted as the      patient had been noncompliant with antihypertensive therapy.  His      pain was treated with as needed oxycodone and as needed      nitroglycerin.  Prophylactic Protonix and Lovenox were started as      well.  Because of the patient's bronchospasms, he was started on      albuterol and Atrovent nebulizers.  Over the course of the      hospitalization, the patient's pain subsided and then completely      resolved.  Cardiac enzymes that were ordered, were completely      within normal limits.  A follow up EKG revealed normal sinus rhythm      with no abnormalities whatsoever.  A fasting lipid panel was      ordered and revealed a total cholesterol of 160, HDL of 27, LDL of      115 and triglycerides of 88.  His TSH was also assessed and found      to be within normal limits at 1.9.  2. HYPERTENSION.  The patient was restarted on clonidine 0.1 mg b.i.d.      His blood pressure improved during the hospital course.  3. ASTHMA WITH ONGOING TOBACCO USE.  The patient was advised to stop      smoking.  A nicotine patch was placed.  He was treated with      albuterol and Atrovent nebulizers.  Over the course of the      hospitalization, his bronchospasms subsided.      Elliot Cousin, M.D.  Electronically Signed     DF/MEDQ  D:  07/24/2007  T:  07/26/2007  Job:  [id number]   cc:   Melvern Banker  Fax: [phone]

## 2010-10-22 NOTE — H&P (Signed)
NAME:  JAZZ, ROGALA NO.:  [id number]   MEDICAL RECORD NO.:  [id number]          PATIENT TYPE:  INP   LOCATION:  3733                         FACILITY:  [facility]   PHYSICIAN:  Hettie Holstein, D.O.    DATE OF BIRTH:  [birth year]   DATE OF ADMISSION:  07/31/2007  DATE OF DISCHARGE:                              HISTORY & PHYSICAL   PRIMARY CARE PHYSICIAN:  [facility], Dr. Reche Dixon.   CHIEF COMPLAINT:  Chest pain.   HISTORY OF PRESENT ILLNESS:  Mr. Gassett is a morbidly obese 40 year old  male with hyperlipidemia, obesity and a recent hospitalization of course  for chest pain for which he underwent rule out by cycling of his cardiac  markers, otherwise he has not undergone any other risk stratification  strategies.  He has recurrence of his chest pain today, occurring at  rest with nausea and diaphoresis.  He states this is quite severe.  There is no radiation.  This is persistent.  There was no help with  nitroglycerin or aspirin via EMS.  He is quite anxious and complaining  of predominantly left subcostal and parasternal throbbing sharp chest  pain as noted above associated with diaphoresis and nausea.  He also  complains of some dizziness.  Last time his cardiac markers were cycled  and negative.  His LDL was elevated.  It was also determined that he had  family history for early coronary disease.   PAST MEDICAL HISTORY:  Significant for asthma, depression with multiple  psychiatric hospitalizations for suicidal ideation, obesity,  polysubstance abuse including marijuana, narcotics, heroin, cocaine,  benzodiazepines and alcohol.  He states that he is abstinent at present.  Other history of fatty liver, ureteral stones with imaging on January 23  via CT revealing obstruction though he stated that he eventually  spontaneously passed this therefore he did not pursue followup with  urology as recommended by the emergency room at that time.   MEDICATIONS:  This is a  derived from most recent discharge summary  dictated by Dr. Sherrie Mustache less than a couple weeks ago as he does not  recall all of his medications.  These may need to be reconciled if there  have been any changes in the interim which he states there has not.  1. Clonidine 0.2 mg half a tablet b.i.d.  2. Albuterol inhaler 2 puffs q.4 h. p.r.n.  3. Nicotine patch.  4. Campral 333 mg t.i.d.  5. Amantadine 100 mg p.o. b.i.d.  6. Flexeril 20 mg daily.  7. Zyprexa 5 mg b.i.d.   ALLERGIES:  No known drug allergies.   SOCIAL HISTORY:  Please refer to most recent H and P, however, these  included current tobacco abuse about half a pack of cigarettes per day.  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  He denies alcohol  or any illicit drug abuse since he has gotten out of psychiatric  inpatient.   FAMILY HISTORY:  As noted above, significant for early heart disease  with mother dying just this past year with massive heart attack at age  60.  Father is  at age 66 and has a history of coronary artery disease  already with hypertension, diabetes and polysubstance abuse.   REVIEW OF SYSTEMS:  This is essentially unremarkable with the exception  of that which was described in the history of presenting illness.   PHYSICAL EXAMINATION:  VITAL SIGNS:  In the emergency department Mr.  Carneal blood pressure is 166/82, temperature 98, heart rate 85,  respirations 24, O2 saturation 97%.  HEENT:  Reveals head to be normocephalic, atraumatic.  Extraocular  muscle were intact.  He did not appear to be in acute distress.  CARDIOVASCULAR:  Exam revealed normal S1-S2.  LUNGS:  Lungs were clear.  He exhibited a normal effort.  There was no  dullness to percussion.  ABDOMEN:  Was obese.  Soft, nontender.  EXTREMITIES:  Lower extremities revealed no calf tenderness.  Peripheral  pulses were symmetrical and palpable bilaterally.  NEUROLOGIC:  Exam reveals him to be euthymic.  His affect appeared to  be  stable.   LABORATORY DATA:  Only EKG and point of care markers are ordered in the  emergency department which are negative so far with an EKG revealing  normal sinus rhythm.  There is no imaging study at this time and we will  request one as it has not been ordered by the EDP.   ASSESSMENT:  1. Atypical chest pain, recurrent.  2. Obesity, morbid.  3. Hyperlipidemia and family history significant for early disease.  4. History of ureteral stone but he states that he has passed this.  5. Tobacco abuse.   PLAN:  For him to be admitted.  We will cycle his cardiac markers to  rule out acute ischemic event.  He will certainly probably need more  definitive risk evaluation at this time, perhaps a stress test  evaluation.  There was some unclear mentions of pericardial recess fluid  on a prior CT scan that was performed earlier.  If these cardiac  evaluations probe to be negative he may certainly need further workup  for noncardiac chest pain including a GI evaluation workup, perhaps this  is esophageal.      Hettie Holstein, D.O.  Electronically Signed     ESS/MEDQ  D:  07/31/2007  T:  08/01/2007  Job:  [id number]   cc:   Dineen Kid. Reche Dixon, M.D.
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
## 2010-10-22 NOTE — Discharge Summary (Signed)
NAME:  Richard Anderson, Richard Anderson NO.:  [id number]   MEDICAL RECORD NO.:  [id number]          PATIENT TYPE:  INP   LOCATION:  3733                         FACILITY:  [facility]   PHYSICIAN:  Madaline Savage, MD        DATE OF BIRTH:  [birth year]   DATE OF ADMISSION:  07/31/2007  DATE OF DISCHARGE:  08/04/2007                               DISCHARGE SUMMARY   PRIMARY CARE PHYSICIAN:  [facility].   DISCHARGE DIAGNOSIS:  1. Noncardiac chest pain.  2. Obesity.  3. History of polysubstance abuse.  4. Depression.  5. History of asthma.   DISCHARGE MEDICATIONS:  1. Clonidine 0.2 mg half tablet twice daily.  2. Albuterol inhaler 2 puffs four times daily as needed.  3. Campral 333 mg three times daily.  4. Amantadine 100 mg twice daily.  5. Flexeril 20 mg daily.  6. Zyprexa 5 mg twice daily.  7. Aspirin 81 mg daily.  8. Vicodin 5/325 one tablet three times daily for 2 days as needed.   HISTORY OF PRESENT ILLNESS:  For full history and physical, see the  history and physical dictated by Dr. Hannah Beat.  In short, Richard Anderson  is a 40 year old obese gentleman with history of coronary artery  disease, who was admitted with chest pain.   PROCEDURES DONE IN HOSPITAL:  He had a cardiac catheterization done on  August 04, 2007, with the preliminary report showing normal coronaries  with good LV function.   PROBLEM LIST:  Chest pain.  Richard Anderson was admitted and cardiac markers  were cycled.  His cardiac markers were negative.  Cardiology, Dr.  Algie Coffer,  was consulted and a stress Myoview test was done.  The stress  Myoview test was abnormal, and so he was taken for cardiac  catheterization.  His cardiac catheterization showed normal coronaries,  and he has been cleared for discharge by cardiology.  At this time, I  will continue him on a baby aspirin.  He does have risk factors for  coronary artery disease including obesity, and a strong family history  of coronary artery disease.   DISPOSITION:  He is now being discharged home in stable condition.   FOLLOW UP:  He is asked to follow up with his primary care doctor, Dr.  Reche Dixon at [facility] in 1-2 weeks.      Madaline Savage, MD  Electronically Signed     PKN/MEDQ  D:  08/04/2007  T:  08/05/2007  Job:  [id number]

## 2010-10-22 NOTE — Cardiovascular Report (Signed)
NAME:  Richard Anderson, Richard Anderson NO.:  [id number]   MEDICAL RECORD NO.:  [id number]          PATIENT TYPE:  INP   LOCATION:  3733                         FACILITY:  [facility]   PHYSICIAN:  Ricki Rodriguez, M.D.  DATE OF BIRTH:  07/03/1970   DATE OF PROCEDURE:  08/04/2007  DATE OF DISCHARGE:  08/04/2007                            CARDIAC CATHETERIZATION   SURGEON:  Ricki Rodriguez, M.D.   Left heart catheterization, selective coronary angiography.   INDICATION:  This 40 year old white male with recurrent chest pain,  strong family history of coronary artery disease had abnormal nuclear  stress test.   Approach right femoral artery using 5-French sheath.   COMPLICATIONS:  None.   Less than 30 cc dye was used.   HEMODYNAMIC DATA:  The left ventricular pressure was 118/16, and the  aortic pressure was 122/76.   CORONARY ANATOMY:  The left main coronary artery was unremarkable.   The left anterior descending coronary artery had somewhat slow flow in  the distal vessel but was unremarkable.   DIAGONAL VESSEL:  The diagonal vessel was also unremarkable.   LEFT CIRCUMFLEX CORONARY ARTERY:  The left circumflex coronary artery  was normal and its ramus branch was a large vessel and was normal.  The  obtuse marginal branches were small vessels.   RIGHT CORONARY ARTERY:  The right coronary artery was dominant and  unremarkable, and its posterior descending coronary artery and posterior  lateral branches were unremarkable.      Ricki Rodriguez, M.D.  Electronically Signed     ASK/MEDQ  D:  08/04/2007  T:  08/04/2007  Job:  [id number]

## 2010-12-10 ENCOUNTER — Emergency Department (HOSPITAL_COMMUNITY): Payer: Self-pay

## 2010-12-10 ENCOUNTER — Inpatient Hospital Stay (HOSPITAL_COMMUNITY)
Admission: EM | Admit: 2010-12-10 | Discharge: 2010-12-13 | DRG: 287 | Disposition: A | Discharge status: Home / Self Care | Payer: Self-pay | Attending: Family Medicine | Admitting: Family Medicine

## 2010-12-10 DIAGNOSIS — I10 Essential (primary) hypertension: Secondary | ICD-10-CM | POA: Diagnosis present

## 2010-12-10 DIAGNOSIS — F341 Dysthymic disorder: Secondary | ICD-10-CM | POA: Diagnosis present

## 2010-12-10 DIAGNOSIS — G8929 Other chronic pain: Secondary | ICD-10-CM | POA: Diagnosis present

## 2010-12-10 DIAGNOSIS — K219 Gastro-esophageal reflux disease without esophagitis: Secondary | ICD-10-CM | POA: Diagnosis present

## 2010-12-10 DIAGNOSIS — R0789 Other chest pain: Principal | ICD-10-CM | POA: Diagnosis present

## 2010-12-10 DIAGNOSIS — F17200 Nicotine dependence, unspecified, uncomplicated: Secondary | ICD-10-CM | POA: Diagnosis present

## 2010-12-10 DIAGNOSIS — M25569 Pain in unspecified knee: Secondary | ICD-10-CM | POA: Diagnosis present

## 2010-12-10 DIAGNOSIS — M1990 Unspecified osteoarthritis, unspecified site: Secondary | ICD-10-CM | POA: Diagnosis present

## 2010-12-10 DIAGNOSIS — N390 Urinary tract infection, site not specified: Secondary | ICD-10-CM | POA: Diagnosis present

## 2010-12-10 DIAGNOSIS — R42 Dizziness and giddiness: Secondary | ICD-10-CM | POA: Diagnosis present

## 2010-12-10 DIAGNOSIS — J45909 Unspecified asthma, uncomplicated: Secondary | ICD-10-CM | POA: Diagnosis present

## 2010-12-10 DIAGNOSIS — G4733 Obstructive sleep apnea (adult) (pediatric): Secondary | ICD-10-CM | POA: Diagnosis present

## 2010-12-10 DIAGNOSIS — E119 Type 2 diabetes mellitus without complications: Secondary | ICD-10-CM | POA: Diagnosis present

## 2010-12-10 LAB — TROPONIN I
Troponin I: <0.3 ng/mL (ref <0.30)
Troponin I: <0.3 ng/mL (ref <0.30)

## 2010-12-10 LAB — CK TOTAL AND CKMB (NOT AT ARMC)
CK, MB: 2.8 ng/mL (ref 0.3–4.0)
Relative Index: 1 (ref 0.0–2.5)
Relative Index: 1 (ref 0.0–2.5)
Total CK: 283 U/L — ABNORMAL HIGH (ref 7–232)

## 2010-12-10 LAB — URINALYSIS, ROUTINE W REFLEX MICROSCOPIC
Glucose, UA: NEGATIVE mg/dL
Protein, ur: NEGATIVE mg/dL
pH: 6 (ref 5.0–8.0)

## 2010-12-10 LAB — COMPREHENSIVE METABOLIC PANEL
AST: 18 U/L (ref 0–37)
Albumin: 3 g/dL — ABNORMAL LOW (ref 3.5–5.2)
Alkaline Phosphatase: 57 U/L (ref 39–117)
Chloride: 107 mEq/L (ref 96–112)
Creatinine, Ser: 0.88 mg/dL (ref 0.50–1.35)
Potassium: 3.9 mEq/L (ref 3.5–5.1)
Total Bilirubin: 0.1 mg/dL — ABNORMAL LOW (ref 0.3–1.2)
Total Protein: 6.3 g/dL (ref 6.0–8.3)

## 2010-12-10 LAB — DIFFERENTIAL
Basophils Absolute: 0 10*3/uL (ref 0.0–0.1)
Basophils Relative: 0 % (ref 0–1)
Eosinophils Absolute: 0.2 10*3/uL (ref 0.0–0.7)
Eosinophils Relative: 1 % (ref 0–5)
Monocytes Absolute: 0.8 10*3/uL (ref 0.1–1.0)

## 2010-12-10 LAB — URINE MICROSCOPIC-ADD ON

## 2010-12-10 LAB — CBC
MCH: 27.6 pg (ref 26.0–34.0)
WBC: 11.5 10*3/uL — ABNORMAL HIGH (ref 4.0–10.5)

## 2010-12-10 LAB — RAPID URINE DRUG SCREEN, HOSP PERFORMED: Benzodiazepines: NOT DETECTED

## 2010-12-10 NOTE — H&P (Signed)
NAME:  Richard Anderson, Richard Anderson NO.:  [id number]  MEDICAL RECORD NO.:  [id number]  LOCATION:  [facility]                         FACILITY:  [facility]  PHYSICIAN:  Hilary Hertz, MD      DATE OF BIRTH:  07/30/1970  DATE OF ADMISSION:  12/10/2010 DATE OF DISCHARGE:                             HISTORY & PHYSICAL   PRIMARY CARDIOLOGIST:  Ricki Rodriguez, MD  CHIEF COMPLAINT:  Chest pain.  HISTORY OF PRESENT ILLNESS:  The patient is a 40 year old morbidly obese man with a family history of early CAD, current smoker, borderline diabetes, depression, anxiety, and polysubstance abuse who presents with an episode of chest pain today.  The patient reports that he was at his counseling appointment and was leaving.  Everything went fine at the counseling appointment, but he was walking over to the bus and walked a little bit more than 20 feet, then had acute onset acute of substernal chest pain which was associated with shortness of breath, lightheadedness, nausea, dizziness, and profuse sweating.  The patient sat down and bystander walked by and actually called an ambulance for him.  He was given 4 aspirin and nitro by EMS and the pain abated a little bit.  He was given IV pain medications and this helped his pain a little bit in the emergency department.  He reports that since that initial episode every time he stands up, he feels dizzy.  He did not eat anything out of the ordinary today, he ate a salad for lunch.  The patient did not have any undue stress at his counselor's appointment.  He does not report any family issues or any anxiety- provoking issues at this time.  He does say that his current psych issues have been well controlled recently.  Over the past few weeks, he also noted lower extremity edema that occurs when he sits at his computer for more than a couple hours at a time.  He also reports that he has been told by multiple family members that he snores and stops breathing  when he sleeps, although he has never had a formal sleep study.  He does have some chronic shortness of breath associated with his asthma but he felt that this episode today was much different than his prior symptoms of this.  He denies any fevers but may have had some chills earlier in the week.  He denies any dysuria.  Although he has been dizzy, he denies any frank syncope.  When the chest pain did occur, it was on the left side of his chest and also was substernal and he described it as "squeezing like a vice grip."  At this time, the patient does feel a little bit better than on presentation.  He is still having a low level of chest pain.  REVIEW OF SYSTEMS:  Review of 10 organ systems was done and was negative except as stated above in the HPI.  ALLERGIES:  No known drug allergies.  MEDICATIONS: 1. Advair 250/50 one puff twice a day. 2. Ambien 10 mg daily. 3. Albuterol as needed. 4. Abilify 10 mg daily. 5. Vitamin B12 1000 mcg daily. 6. Folic acid 1 mg daily. 7.  Ibuprofen as needed. 8. Klonopin 1 mg b.i.d. 9. Lisinopril 20 mg daily. 10.Zoloft 150 mg daily. 11. Metformin (pt does not know dose)  PAST MEDICAL HISTORY: 1. Asthma. 2. Hypertension. 3. Prior history of alcohol abuse. 4. Depression. 5. Anxiety. 6. Morbid obesity. 7. Prior history of polysubstance abuse. 8. Chronic knee pain. 9. Osteoarthritis. 10.Diabetes, recently started on metformin.  PAST SURGICAL HISTORY:  Left knee surgery.  SOCIAL HISTORY:  The patient currently smokes 1/2-pack per day as he is trying to cut down.  He has smoked up to 2-1/2 packs a day since age 65. He does have a prior history of polysubstance abuse.  He denies any current alcohol or drug use, although his urine toxicology screen is positive for marijuana.  FAMILY HISTORY:  Father had 2 heart attacks before age 48 and ultimately died in a motor vehicle accident.  His mother had an MI at age 71 and died basically from what he  said "her aorta exploded," it sounds like possibly a AAA.  Brother also died of a CVA at a young age.  PHYSICAL EXAMINATION:  VITAL SIGNS:  Blood pressure 106/91, pulse 92, respirations 14, and temperature 98.1 on presentation.  Current systolic blood pressures in the 150s. GENERAL:  The patient is in no acute distress.  He is resting comfortably. HEENT:  Mucous membranes are moist.  The patient has poor dentition. NECK:  Obese.  No appreciable JVP. CV:  Regular rate and rhythm.  No murmurs, rubs, or gallops. LUNGS:  The patient has inspiratory and expiratory wheezes throughout his lung fields.  No crackles. ABDOMEN:  Obese, soft, nontender, and nondistended. EXTREMITIES:  No pitting edema. NEURO:  Nonfocal. PSYCHIATRIC:  Appropriate, normal mood and affect. SKIN:  No rashes.  The patient has sloughing skin over the bottom of both feet.  LABORATORY DATA:  Sodium 139, potassium 3.9, chloride 107, bicarb 20, BUN 13, and creatinine 0.88.  LFTs are normal.  Glucose is 102.  White count 11.5, hemoglobin 13.2, and platelets are 305.  BNP is 47. Troponin less than 0.30 x2, CK-MB negative x2.  UA:  Positive nitrites, positive leukocyte esterase, large blood with 7-10 wbc's, 3-6 rbc's. Urine drug screen shows THC and opiates.  EKG - sinus, no acute ischemic changes  Chest x-ray:  Cardiomegaly, mild vascular congestion, early bilateral infiltrates versus failure not excluded.    Cardiac cath in 2009 shows no coronary artery disease.  ASSESSMENT AND PLAN:  The patient is a 40 year old morbidly obese man with prior history of heavy smoking (current smoker) and polysubstance abuse and family history of coronary disease, recent diagnosis of DM, who presents with chest pain, 1. Chest pain.  Differential includes musculoskeletal pain versus     angina versus anxiety versus other.  At this time, the patient has     had 2 sets of cardiac enzymes that are negative.  We will admit to      telemetry for observation.  We will continue to cycle cardiac     enzymes.  His EKG does not show any signs of acute ischemia.  There     are no ST or T-wave changes.  I have discussed this case with Dr.     Algie Coffer, the patient's outpatient cardiologist, who did his last     procedure and he has recommended a Persantine Myoview study, which     will be ordered.  The patient will be kept n.p.o. after midnight     for the study.  If  the study shows any signs of ischemia, the     patient will need a followup catheterization.  We will also order     an echocardiogram as the patient has not had an echocardiogram and     is complaining of shortness of breath and lower extremity edema.  Will provide pain control with morphine.  Check fasting lipids and HgBa1c. 2. Dirty UA, concering for infection.  Microscopic showing hyphae--> ? yeast.  Will send urine cx.  Pt did have recent sx of chills, but no dysuria.  Will await for urine cx prior to treating for possible UTI (could just be colonization?) 3. Dizziness.  check orthostatics (pt was hypotensive on admit, may have been dehydrated 2/2 extreme heat today).  SBP currently 150s, continue to monitor. 4. Diabetes.  Sliding scale insulin.  Hold metformin while in hospital.  Check HgBA1c.  DM education consult ordered. 5. HTN.  continue home lisinopril. 6. Anxiety and depression.  We will continue the patient's home meds. 7. Morbid obesity.  I have counseled the patient on the importance of     weight loss for overall health and especially cardiovascular     health. 8. Tobacco abuse.  The patient is currently down to 1/2-pack a day     from 2-1/2 packs a day.  He states that he is a very addictive     personality and when he tries to cut back on smoking, he eats more     and gains weight and vice versa whenever he tries to diet, he ends     up smoking more.  So at this time, he is unable to cut down more     than 1/2-pack a day. 9. Probable OSA by hx.   will need outpt sleep study. 10. Fluids, electrolytes, nutrition, and Hep-Lock IV.  Replete     electrolytes as needed.  We will put the patient on a heart-healthy     diabetic diet.  We will make him n.p.o. after midnight for the     pending stress test. 11. Prophylaxis.  Lovenox for deep venous thrombosis prophylaxis. 12. Disposition.  The patient is full code, however, he would not want     to be kept alive on prolonged life support in this situation.  He     does not have a formal living will documenting this.  He will be     admitted to [facility] for now and if the stress test is positive, Dr.     Algie Coffer will transfer him to his service for further care.          ______________________________ Hilary Hertz, MD     JF/MEDQ  D:  12/10/2010  T:  12/10/2010  Job:  [id number]  Electronically Signed by Hilary Hertz MD on 12/10/2010 10:29:43 PM

## 2010-12-11 ENCOUNTER — Encounter (HOSPITAL_COMMUNITY): Payer: Self-pay | Admitting: Radiology

## 2010-12-11 ENCOUNTER — Inpatient Hospital Stay (HOSPITAL_COMMUNITY): Payer: Self-pay

## 2010-12-11 LAB — HEMOGLOBIN A1C
Hgb A1c MFr Bld: 6.6 % — ABNORMAL HIGH (ref <5.7)
Mean Plasma Glucose: 143 mg/dL — ABNORMAL HIGH (ref <117)

## 2010-12-11 LAB — LIPID PANEL
Cholesterol: 146 mg/dL (ref 0–200)
Triglycerides: 128 mg/dL (ref <150)
VLDL: 26 mg/dL (ref 0–40)

## 2010-12-11 LAB — GLUCOSE, CAPILLARY
Glucose-Capillary: 106 mg/dL — ABNORMAL HIGH (ref 70–99)
Glucose-Capillary: 120 mg/dL — ABNORMAL HIGH (ref 70–99)
Glucose-Capillary: 120 mg/dL — ABNORMAL HIGH (ref 70–99)
Glucose-Capillary: 123 mg/dL — ABNORMAL HIGH (ref 70–99)
Glucose-Capillary: 98 mg/dL (ref 70–99)

## 2010-12-11 LAB — MRSA PCR SCREENING: MRSA by PCR: NEGATIVE

## 2010-12-11 LAB — CARDIAC PANEL(CRET KIN+CKTOT+MB+TROPI)
CK, MB: 2.8 ng/mL (ref 0.3–4.0)
CK, MB: 3 ng/mL (ref 0.3–4.0)
Relative Index: 1.3 (ref 0.0–2.5)
Total CK: 239 U/L — ABNORMAL HIGH (ref 7–232)
Troponin I: <0.3 ng/mL (ref <0.30)

## 2010-12-11 LAB — CBC
Hemoglobin: 12.4 g/dL — ABNORMAL LOW (ref 13.0–17.0)
Platelets: 249 10*3/uL (ref 150–400)
RBC: 4.58 MIL/uL (ref 4.22–5.81)
WBC: 7.6 10*3/uL (ref 4.0–10.5)

## 2010-12-11 LAB — D-DIMER, QUANTITATIVE: D-Dimer, Quant: 0.62 ug/mL-FEU — ABNORMAL HIGH (ref 0.00–0.48)

## 2010-12-11 LAB — BASIC METABOLIC PANEL
Calcium: 8.3 mg/dL — ABNORMAL LOW (ref 8.4–10.5)
Creatinine, Ser: 0.91 mg/dL (ref 0.50–1.35)
GFR calc non Af Amer: >60 mL/min (ref >60)
Sodium: 139 mEq/L (ref 135–145)

## 2010-12-11 MED ORDER — IOHEXOL 350 MG/ML SOLN
120.0000 mL | Freq: Once | INTRAVENOUS | Status: AC | PRN
  Administered 2010-12-11: 120 mL via INTRAVENOUS

## 2010-12-12 ENCOUNTER — Inpatient Hospital Stay (HOSPITAL_COMMUNITY): Payer: Self-pay

## 2010-12-12 LAB — BASIC METABOLIC PANEL
CO2: 28 mEq/L (ref 19–32)
Calcium: 8.2 mg/dL — ABNORMAL LOW (ref 8.4–10.5)
GFR calc non Af Amer: >60 mL/min (ref >60)
Potassium: 3.7 mEq/L (ref 3.5–5.1)
Sodium: 140 mEq/L (ref 135–145)

## 2010-12-12 LAB — URINE CULTURE: Culture  Setup Time: 201207040615

## 2010-12-12 LAB — GLUCOSE, CAPILLARY
Glucose-Capillary: 90 mg/dL (ref 70–99)
Glucose-Capillary: 127 mg/dL — ABNORMAL HIGH (ref 70–99)

## 2010-12-13 ENCOUNTER — Inpatient Hospital Stay (HOSPITAL_COMMUNITY): Payer: Self-pay

## 2010-12-13 ENCOUNTER — Other Ambulatory Visit (HOSPITAL_COMMUNITY): Payer: Self-pay

## 2010-12-13 LAB — CBC
Platelets: 281 10*3/uL (ref 150–400)
RBC: 4.62 MIL/uL (ref 4.22–5.81)
RDW: 14.9 % (ref 11.5–15.5)
WBC: 9.7 10*3/uL (ref 4.0–10.5)

## 2010-12-13 LAB — BASIC METABOLIC PANEL
Chloride: 103 mEq/L (ref 96–112)
GFR calc Af Amer: >60 mL/min (ref >60)
GFR calc non Af Amer: >60 mL/min (ref >60)
Potassium: 4 mEq/L (ref 3.5–5.1)
Sodium: 138 mEq/L (ref 135–145)

## 2010-12-13 LAB — GLUCOSE, CAPILLARY: Glucose-Capillary: 102 mg/dL — ABNORMAL HIGH (ref 70–99)

## 2010-12-13 NOTE — Discharge Summary (Signed)
NAME:  Richard Anderson, Richard Anderson NO.:  [id number]  MEDICAL RECORD NO.:  [id number]  LOCATION:  3715                         FACILITY:  [facility]  PHYSICIAN:  Standley Dakins, MD   DATE OF BIRTH:  07/30/1970  DATE OF ADMISSION:  12/10/2010 DATE OF DISCHARGE:                              DISCHARGE SUMMARY   PRIMARY CARDIOLOGIST:  Ricki Rodriguez, MD  DISCHARGE DIAGNOSES: 1. Atypical chest pain. 2. Question of obstructive sleep apnea. 3. Morbid obesity. 4. Asthma. 5. Hypertension. 6. Type 2 diabetes mellitus. 7. Arthritis. 8. Weightbearing arthropathy. 9. Prior history of polysubstance abuse. 10.Anxiety disorder with depression. 11.Chronic knee pain. 12.Status post recent cardiac catheterization with report of normal     coronary arteries and preserved left ventricular systolic function. 13.Question of gastroesophageal reflux disease.  DISCHARGE MEDICATIONS: 1. Advair 250/50 1+ puff twice per day. 2. Albuterol HFA 2 puffs q.4 h. p.r.n. shortness of breath and     wheezing. 3. Lisinopril 20 mg p.o. daily. 4. Klonopin 1 mg b.i.d. 5. Abilify 10 mg p.o. daily. 6. Ambien 10 mg p.o. at bedtime for insomnia. 7. Ranitidine 150 mg tablets 1 p.o. b.i.d. 8. Simvastatin 20 mg p.o. daily. 9. Folic acid 1 mg daily. 10.Ibuprofen 1 tablet every 8 h. p.r.n. 11.Metformin 500 mg 1 tablet twice daily to begin in 2 days after     discharge. 12.Zoloft 100 mg 1-1/2 tablets p.o. daily.  HOSPITAL COURSE:  Briefly, this patient was admitted into the hospital with atypical chest pain.  He is a current smoker, has diabetes mellitus, anxiety and family history of early coronary artery disease. He presented with an acute chest pain episode.  He described his pain is substernal chest pain.  He had some chronic shortness of breath related to his asthma.  He was admitted into the hospital and a Cardiology consultation was obtained with Dr. Algie Coffer.  The patient was too obese for the regular  stress test that was initially ordered, therefore, the patient was taken to cardiac catheterization, where he was found to have normal coronary arteries and preserved left ventricular systolic function.  The patient was also observed to have possible obstructive sleep apnea and I am recommending that he has a sleep study done as an outpatient.    I am also going to make arrangements for him to get established with [facility] for primary care.  The patient had lipid studies done and his LDL studies were within normal limits with an LDL of 96 reported and a total cholesterol of 146.  He had a very low HDL of 24.  The patient had a CT angiogram of chest to rule out pulmonary embolus.  There was no pulmonary emboli found and no evidence of active pulmonary disease seen.  The patient was also treated for urinary tract infection with Levaquin.  The patient's chest x-ray on December 13, 2010, revealed mild bibasilar atelectasis, but no acute disease on x-ray.  T.  DISCHARGE CONDITION:  Stable.  DISPOSITION:  The patient will be discharged home.  FOLLOWUP:  Follow up with [facility] for primary care, continue to follow up with his Psychiatry Clinic and receives counseling services as  previously before admission.  Also, I have recommended the patient get an outpatient sleep study and began CPAP treatments.  DIET:  Recommend a cardiac and diabetic diet.  SPECIAL INSTRUCTIONS: 1. I asked the patient to hold the metformin for an additional 2 days     after discharge before restarting.  The patient verbalized     understanding. 2. Please follow up with the outpatient primary care appointment and     get a sleep study done, it is very important. 3. Return if symptoms recur, worsen or if new changes develop. 4. I spent 37 minutes preparing this patient's discharge including     reviewing his medical records, reconciling home medications and     dictating a  discharge summary.  The patient was evaluated on the     day of discharge.  His vital signs were stable and the patient was     in no distress and comfortable.  I explained to the patient that I     thought that trying a acid blocker medication may help his     symptoms.  I also told the patient to please take ibuprofen     appropriately.  The patient verbalized understanding. 5.  Return if symptoms recur, worsen or new changes develop.    **Addendum:  We had arranged for the patient to take CPAP supplies and a CPAP from [facility] prior to discharge.  The patient decided to leave before receiving the equipment because it was taking too long.  He said  that he had an appointment with Social Services that he was not going to  miss.  Therefore, he left without receiving any C-Pap supplies.  We had  also made an appointment for him for an outpatient sleep study. We  strongly encouraged the patient to please make the appointment.  The  patient verbalized understanding.     Standley Dakins, MD     CJ/MEDQ  D:  12/13/2010  T:  12/13/2010  Job:  [id number]  cc:   [facility] Ricki Rodriguez, M.D.  Electronically Signed by Standley Dakins  on 12/13/2010 06:00:26 PM

## 2010-12-15 ENCOUNTER — Emergency Department (HOSPITAL_COMMUNITY): Payer: Self-pay

## 2010-12-15 ENCOUNTER — Emergency Department (HOSPITAL_COMMUNITY)
Admission: EM | Admit: 2010-12-15 | Discharge: 2010-12-15 | Disposition: A | Discharge status: Home / Self Care | Payer: Self-pay | Attending: Emergency Medicine | Admitting: Emergency Medicine

## 2010-12-15 DIAGNOSIS — I10 Essential (primary) hypertension: Secondary | ICD-10-CM | POA: Insufficient documentation

## 2010-12-15 DIAGNOSIS — Z7902 Long term (current) use of antithrombotics/antiplatelets: Secondary | ICD-10-CM | POA: Insufficient documentation

## 2010-12-15 DIAGNOSIS — E669 Obesity, unspecified: Secondary | ICD-10-CM | POA: Insufficient documentation

## 2010-12-15 DIAGNOSIS — J45909 Unspecified asthma, uncomplicated: Secondary | ICD-10-CM | POA: Insufficient documentation

## 2010-12-15 DIAGNOSIS — Z79899 Other long term (current) drug therapy: Secondary | ICD-10-CM | POA: Insufficient documentation

## 2010-12-15 DIAGNOSIS — E119 Type 2 diabetes mellitus without complications: Secondary | ICD-10-CM | POA: Insufficient documentation

## 2010-12-15 DIAGNOSIS — F329 Major depressive disorder, single episode, unspecified: Secondary | ICD-10-CM | POA: Insufficient documentation

## 2010-12-15 DIAGNOSIS — R109 Unspecified abdominal pain: Secondary | ICD-10-CM | POA: Insufficient documentation

## 2010-12-15 DIAGNOSIS — F3289 Other specified depressive episodes: Secondary | ICD-10-CM | POA: Insufficient documentation

## 2010-12-15 LAB — CBC
HCT: 44.1 % (ref 39.0–52.0)
Hemoglobin: 15 g/dL (ref 13.0–17.0)
MCH: 27.9 pg (ref 26.0–34.0)
MCHC: 34 g/dL (ref 30.0–36.0)
MCV: 82.1 fL (ref 78.0–100.0)
RDW: 15.1 % (ref 11.5–15.5)

## 2010-12-15 LAB — DIFFERENTIAL
Basophils Absolute: 0 10*3/uL (ref 0.0–0.1)
Basophils Relative: 0 % (ref 0–1)
Neutro Abs: 8 10*3/uL — ABNORMAL HIGH (ref 1.7–7.7)
Neutrophils Relative %: 71 % (ref 43–77)

## 2010-12-15 LAB — BASIC METABOLIC PANEL
BUN: 19 mg/dL (ref 6–23)
Creatinine, Ser: 0.95 mg/dL (ref 0.50–1.35)
GFR calc Af Amer: >60 mL/min (ref >60)
GFR calc non Af Amer: >60 mL/min (ref >60)
Glucose, Bld: 110 mg/dL — ABNORMAL HIGH (ref 70–99)
Potassium: 3.8 mEq/L (ref 3.5–5.1)

## 2010-12-15 LAB — URINALYSIS, ROUTINE W REFLEX MICROSCOPIC
Hgb urine dipstick: NEGATIVE
Ketones, ur: 15 mg/dL — AB
Specific Gravity, Urine: 1.034 — ABNORMAL HIGH (ref 1.005–1.030)
Urobilinogen, UA: 1 mg/dL (ref 0.0–1.0)

## 2010-12-15 MED ORDER — IOHEXOL 300 MG/ML  SOLN
100.0000 mL | Freq: Once | INTRAMUSCULAR | Status: AC | PRN
  Administered 2010-12-15: 100 mL via INTRAVENOUS

## 2011-01-02 ENCOUNTER — Ambulatory Visit (HOSPITAL_BASED_OUTPATIENT_CLINIC_OR_DEPARTMENT_OTHER): Payer: Self-pay | Attending: Family Medicine

## 2011-01-02 DIAGNOSIS — Z79899 Other long term (current) drug therapy: Secondary | ICD-10-CM | POA: Insufficient documentation

## 2011-01-02 DIAGNOSIS — G4733 Obstructive sleep apnea (adult) (pediatric): Secondary | ICD-10-CM | POA: Insufficient documentation

## 2011-01-05 DIAGNOSIS — R0609 Other forms of dyspnea: Secondary | ICD-10-CM

## 2011-01-05 DIAGNOSIS — R0989 Other specified symptoms and signs involving the circulatory and respiratory systems: Secondary | ICD-10-CM

## 2011-01-05 DIAGNOSIS — G4733 Obstructive sleep apnea (adult) (pediatric): Secondary | ICD-10-CM

## 2011-01-05 NOTE — Procedures (Signed)
NAME:  CHANDAN, FLY NO.:  [id number]  MEDICAL RECORD NO.:  [id number]          PATIENT TYPE:  OUT  LOCATION:  SLEEP CENTER                 FACILITY:  [facility]  PHYSICIAN:  Elleanor Guyett D. Maple Hudson, MD, FCCP, FACPDATE OF BIRTH:  August 29[birth year] 1971  DATE OF STUDY:  01/02/2011                           NOCTURNAL POLYSOMNOGRAM  REFERRING PHYSICIAN:  CLANFORD L JOHNSON  REFERRING PHYSICIAN:  Standley Dakins, MD  INDICATION FOR STUDY:  Hypersomnia with sleep apnea.  Epworth sleepiness score recorded as 0 by the patient.  BMI 43.4.  Weight 320 pounds, height 72 inches.  Neck 18 inches.  Home medications are charted and reviewed.  SLEEP ARCHITECTURE:  Split study protocol.  During the diagnostic phase, total sleep time 125 minutes with sleep efficiency 89.6%.  Stage I was 11.2%, stage II 88.8%, stages III and REM were absent.  Sleep latency 1 minute, awake after sleep onset 13.5 minutes, arousal index 74.9.  BEDTIME MEDICATION:  Ambien, albuterol, Klonopin.  RESPIRATORY DATA:  Split study protocol.  Apnea/hypopnea index (AHI ) 88.8 per hour.  A total of 185 events was scored including 158 obstructive apneas, 27 hypopneas.  Events were recorded with supine sleep.  CPAP was then titrated to 11 cwp, AHI 5.9 per hour.  He wore a large ResMed Mirage Quattro full-face mask with heated humidifier.  OXYGEN DATA:  Before CPAP, snoring was moderately loud with oxygen desaturation to a nadir of 73% on room air.  With CPAP titration, mean oxygen saturation 91.9% on room air and snoring was prevented.  CARDIAC DATA:  Normal sinus rhythm.  MOVEMENT/PARASOMNIA:  No significant movement disorder.  Bathroom x1.  IMPRESSION/RECOMMENDATIONS: 1. Severe obstructive sleep apnea/hypopnea syndrome, AHI 88.8 per hour     with supine events, moderately loud snoring, and oxygen     desaturation to a nadir of 73% on room air. 2. Successful CPAP titration to 11 cwp, AHI 5.9 per hour.  He wore a     large  ResMed Mirage Quattro full-face mask with heated humidifier.     Blade Scheff D. Maple Hudson, MD, [facility], FACP Diplomate, Biomedical engineer of Sleep Medicine Electronically Signed    CDY/MEDQ  D:  01/05/2011 09:38:01  T:  01/05/2011 19:32:12  Job:  [id number]

## 2011-01-11 ENCOUNTER — Emergency Department (HOSPITAL_COMMUNITY)
Admission: EM | Admit: 2011-01-11 | Discharge: 2011-01-12 | Disposition: A | Discharge status: Home / Self Care | Payer: Self-pay | Attending: Emergency Medicine | Admitting: Emergency Medicine

## 2011-01-11 DIAGNOSIS — R109 Unspecified abdominal pain: Secondary | ICD-10-CM | POA: Insufficient documentation

## 2011-01-11 DIAGNOSIS — R319 Hematuria, unspecified: Secondary | ICD-10-CM | POA: Insufficient documentation

## 2011-01-11 DIAGNOSIS — I10 Essential (primary) hypertension: Secondary | ICD-10-CM | POA: Insufficient documentation

## 2011-01-11 DIAGNOSIS — J45909 Unspecified asthma, uncomplicated: Secondary | ICD-10-CM | POA: Insufficient documentation

## 2011-01-11 DIAGNOSIS — Z79899 Other long term (current) drug therapy: Secondary | ICD-10-CM | POA: Insufficient documentation

## 2011-01-11 DIAGNOSIS — F329 Major depressive disorder, single episode, unspecified: Secondary | ICD-10-CM | POA: Insufficient documentation

## 2011-01-11 DIAGNOSIS — R630 Anorexia: Secondary | ICD-10-CM | POA: Insufficient documentation

## 2011-01-11 DIAGNOSIS — R112 Nausea with vomiting, unspecified: Secondary | ICD-10-CM | POA: Insufficient documentation

## 2011-01-11 DIAGNOSIS — F3289 Other specified depressive episodes: Secondary | ICD-10-CM | POA: Insufficient documentation

## 2011-01-11 DIAGNOSIS — R6883 Chills (without fever): Secondary | ICD-10-CM | POA: Insufficient documentation

## 2011-01-11 DIAGNOSIS — E119 Type 2 diabetes mellitus without complications: Secondary | ICD-10-CM | POA: Insufficient documentation

## 2011-01-11 DIAGNOSIS — R10819 Abdominal tenderness, unspecified site: Secondary | ICD-10-CM | POA: Insufficient documentation

## 2011-01-11 LAB — URINALYSIS, ROUTINE W REFLEX MICROSCOPIC
Bilirubin Urine: NEGATIVE
Glucose, UA: NEGATIVE mg/dL
Protein, ur: NEGATIVE mg/dL
Specific Gravity, Urine: 1.025 (ref 1.005–1.030)

## 2011-01-11 LAB — CBC
MCH: 27.9 pg (ref 26.0–34.0)
Platelets: 238 10*3/uL (ref 150–400)
RBC: 4.52 MIL/uL (ref 4.22–5.81)
WBC: 9.6 10*3/uL (ref 4.0–10.5)

## 2011-01-11 LAB — DIFFERENTIAL
Basophils Absolute: 0 10*3/uL (ref 0.0–0.1)
Basophils Relative: 0 % (ref 0–1)
Eosinophils Absolute: 0.1 10*3/uL (ref 0.0–0.7)
Neutrophils Relative %: 68 % (ref 43–77)

## 2011-01-11 LAB — URINE MICROSCOPIC-ADD ON

## 2011-01-11 LAB — POCT I-STAT, CHEM 8
Calcium, Ion: 1.06 mmol/L — ABNORMAL LOW (ref 1.12–1.32)
HCT: 38 % — ABNORMAL LOW (ref 39.0–52.0)
Hemoglobin: 12.9 g/dL — ABNORMAL LOW (ref 13.0–17.0)
Sodium: 142 mEq/L (ref 135–145)
TCO2: 23 mmol/L (ref 0–100)

## 2011-01-13 LAB — URINE CULTURE
Colony Count: 40000
Culture  Setup Time: 201208051431

## 2011-01-14 ENCOUNTER — Emergency Department (HOSPITAL_COMMUNITY)
Admission: EM | Admit: 2011-01-14 | Discharge: 2011-01-14 | Disposition: A | Discharge status: Home / Self Care | Payer: Self-pay | Attending: Emergency Medicine | Admitting: Emergency Medicine

## 2011-01-14 DIAGNOSIS — E119 Type 2 diabetes mellitus without complications: Secondary | ICD-10-CM | POA: Insufficient documentation

## 2011-01-14 DIAGNOSIS — Z79899 Other long term (current) drug therapy: Secondary | ICD-10-CM | POA: Insufficient documentation

## 2011-01-14 DIAGNOSIS — F329 Major depressive disorder, single episode, unspecified: Secondary | ICD-10-CM | POA: Insufficient documentation

## 2011-01-14 DIAGNOSIS — Q549 Hypospadias, unspecified: Secondary | ICD-10-CM | POA: Insufficient documentation

## 2011-01-14 DIAGNOSIS — R339 Retention of urine, unspecified: Secondary | ICD-10-CM | POA: Insufficient documentation

## 2011-01-14 DIAGNOSIS — I10 Essential (primary) hypertension: Secondary | ICD-10-CM | POA: Insufficient documentation

## 2011-01-14 DIAGNOSIS — J45909 Unspecified asthma, uncomplicated: Secondary | ICD-10-CM | POA: Insufficient documentation

## 2011-01-14 DIAGNOSIS — N390 Urinary tract infection, site not specified: Secondary | ICD-10-CM | POA: Insufficient documentation

## 2011-01-14 DIAGNOSIS — F3289 Other specified depressive episodes: Secondary | ICD-10-CM | POA: Insufficient documentation

## 2011-01-14 DIAGNOSIS — Z9889 Other specified postprocedural states: Secondary | ICD-10-CM | POA: Insufficient documentation

## 2011-01-14 LAB — POCT I-STAT, CHEM 8
Creatinine, Ser: 0.9 mg/dL (ref 0.50–1.35)
Glucose, Bld: 101 mg/dL — ABNORMAL HIGH (ref 70–99)
Hemoglobin: 13.3 g/dL (ref 13.0–17.0)
Potassium: 3.8 mEq/L (ref 3.5–5.1)

## 2011-01-14 LAB — URINALYSIS, ROUTINE W REFLEX MICROSCOPIC
Bilirubin Urine: NEGATIVE
Specific Gravity, Urine: 1.02 (ref 1.005–1.030)
Urobilinogen, UA: 0.2 mg/dL (ref 0.0–1.0)

## 2011-01-14 LAB — URINE MICROSCOPIC-ADD ON

## 2011-01-15 ENCOUNTER — Emergency Department (HOSPITAL_COMMUNITY)
Admission: EM | Admit: 2011-01-15 | Discharge: 2011-01-15 | Disposition: A | Discharge status: Home / Self Care | Payer: Self-pay | Attending: Emergency Medicine | Admitting: Emergency Medicine

## 2011-01-15 DIAGNOSIS — R300 Dysuria: Secondary | ICD-10-CM | POA: Insufficient documentation

## 2011-01-15 DIAGNOSIS — Q549 Hypospadias, unspecified: Secondary | ICD-10-CM | POA: Insufficient documentation

## 2011-01-15 DIAGNOSIS — E119 Type 2 diabetes mellitus without complications: Secondary | ICD-10-CM | POA: Insufficient documentation

## 2011-01-15 DIAGNOSIS — J45909 Unspecified asthma, uncomplicated: Secondary | ICD-10-CM | POA: Insufficient documentation

## 2011-01-15 DIAGNOSIS — Z79899 Other long term (current) drug therapy: Secondary | ICD-10-CM | POA: Insufficient documentation

## 2011-01-15 DIAGNOSIS — F329 Major depressive disorder, single episode, unspecified: Secondary | ICD-10-CM | POA: Insufficient documentation

## 2011-01-15 DIAGNOSIS — F3289 Other specified depressive episodes: Secondary | ICD-10-CM | POA: Insufficient documentation

## 2011-01-15 DIAGNOSIS — I10 Essential (primary) hypertension: Secondary | ICD-10-CM | POA: Insufficient documentation

## 2011-01-15 LAB — URINE CULTURE: Colony Count: NO GROWTH

## 2011-01-16 ENCOUNTER — Emergency Department (HOSPITAL_COMMUNITY)
Admission: EM | Admit: 2011-01-16 | Discharge: 2011-01-17 | Disposition: A | Discharge status: Home / Self Care | Payer: Self-pay | Attending: Emergency Medicine | Admitting: Emergency Medicine

## 2011-01-16 DIAGNOSIS — N390 Urinary tract infection, site not specified: Secondary | ICD-10-CM | POA: Insufficient documentation

## 2011-01-16 DIAGNOSIS — E119 Type 2 diabetes mellitus without complications: Secondary | ICD-10-CM | POA: Insufficient documentation

## 2011-01-16 DIAGNOSIS — R0682 Tachypnea, not elsewhere classified: Secondary | ICD-10-CM | POA: Insufficient documentation

## 2011-01-16 DIAGNOSIS — R300 Dysuria: Secondary | ICD-10-CM | POA: Insufficient documentation

## 2011-01-16 DIAGNOSIS — I10 Essential (primary) hypertension: Secondary | ICD-10-CM | POA: Insufficient documentation

## 2011-02-26 LAB — CBC
HCT: 41.8
HCT: 42.8
Hemoglobin: 14.2
MCV: 84.8
MCV: 85.9
Platelets: 325
RBC: 5.05
RDW: 13.5
WBC: 5.9
WBC: 8.7

## 2011-02-26 LAB — I-STAT 8, (EC8 V) (CONVERTED LAB)
BUN: 11
BUN: 11
Bicarbonate: 25.8 — ABNORMAL HIGH
Chloride: 108
Glucose, Bld: 127 — ABNORMAL HIGH
Glucose, Bld: 94
Hemoglobin: 15
Potassium: 3.5
Sodium: 141
TCO2: 26
pH, Ven: 7.38 — ABNORMAL HIGH

## 2011-02-26 LAB — RAPID URINE DRUG SCREEN, HOSP PERFORMED
Amphetamines: NOT DETECTED
Barbiturates: NOT DETECTED
Benzodiazepines: NOT DETECTED
Cocaine: NOT DETECTED
Opiates: POSITIVE — AB
Tetrahydrocannabinol: NOT DETECTED

## 2011-02-26 LAB — URINE MICROSCOPIC-ADD ON

## 2011-02-26 LAB — HEPATIC FUNCTION PANEL
ALT: 36
Alkaline Phosphatase: 43
Bilirubin, Direct: <0.1
Total Bilirubin: 0.3

## 2011-02-26 LAB — POCT I-STAT CREATININE
Creatinine, Ser: 1
Operator id: 282201

## 2011-02-26 LAB — DIFFERENTIAL
Eosinophils Absolute: 0.1
Eosinophils Absolute: 0.2
Eosinophils Relative: 2
Eosinophils Relative: 3
Lymphs Abs: 1.3
Lymphs Abs: 2.3
Monocytes Absolute: 0.6
Monocytes Relative: 11
Neutrophils Relative %: 59

## 2011-02-26 LAB — POCT CARDIAC MARKERS
CKMB, poc: 1.3
Myoglobin, poc: 107
Myoglobin, poc: 92.9
Operator id: 282201
Troponin i, poc: <0.05

## 2011-02-26 LAB — URINALYSIS, ROUTINE W REFLEX MICROSCOPIC
Glucose, UA: NEGATIVE
pH: 6

## 2011-02-26 LAB — SALICYLATE LEVEL: Salicylate Lvl: <4

## 2011-02-27 LAB — URINALYSIS, ROUTINE W REFLEX MICROSCOPIC
Ketones, ur: NEGATIVE
Leukocytes, UA: NEGATIVE
Nitrite: NEGATIVE
Protein, ur: NEGATIVE

## 2011-02-27 LAB — DIFFERENTIAL
Basophils Absolute: 0
Eosinophils Absolute: 0.1
Eosinophils Relative: 1
Lymphocytes Relative: 13
Lymphs Abs: 1.5
Neutrophils Relative %: 77

## 2011-02-27 LAB — BASIC METABOLIC PANEL
BUN: 24 — ABNORMAL HIGH
Creatinine, Ser: 1.35
GFR calc non Af Amer: >60
Glucose, Bld: 135 — ABNORMAL HIGH
Potassium: 3.7

## 2011-02-27 LAB — URINE MICROSCOPIC-ADD ON

## 2011-02-27 LAB — CBC
HCT: 40.5
Platelets: 306
RDW: 13.7
WBC: 11.6 — ABNORMAL HIGH

## 2011-02-28 ENCOUNTER — Inpatient Hospital Stay (HOSPITAL_COMMUNITY)
Admission: EM | Admit: 2011-02-28 | Discharge: 2011-03-05 | DRG: 191 | Discharge status: Against Medical Advice | Payer: Self-pay | Attending: Internal Medicine | Admitting: Internal Medicine

## 2011-02-28 ENCOUNTER — Emergency Department (HOSPITAL_COMMUNITY): Payer: Self-pay

## 2011-02-28 DIAGNOSIS — F341 Dysthymic disorder: Secondary | ICD-10-CM | POA: Diagnosis present

## 2011-02-28 DIAGNOSIS — K7689 Other specified diseases of liver: Secondary | ICD-10-CM | POA: Diagnosis present

## 2011-02-28 DIAGNOSIS — J441 Chronic obstructive pulmonary disease with (acute) exacerbation: Principal | ICD-10-CM | POA: Diagnosis present

## 2011-02-28 DIAGNOSIS — I5030 Unspecified diastolic (congestive) heart failure: Secondary | ICD-10-CM | POA: Diagnosis present

## 2011-02-28 DIAGNOSIS — K219 Gastro-esophageal reflux disease without esophagitis: Secondary | ICD-10-CM | POA: Diagnosis present

## 2011-02-28 DIAGNOSIS — E119 Type 2 diabetes mellitus without complications: Secondary | ICD-10-CM | POA: Diagnosis present

## 2011-02-28 DIAGNOSIS — M25569 Pain in unspecified knee: Secondary | ICD-10-CM | POA: Diagnosis present

## 2011-02-28 DIAGNOSIS — F17200 Nicotine dependence, unspecified, uncomplicated: Secondary | ICD-10-CM | POA: Diagnosis present

## 2011-02-28 DIAGNOSIS — E669 Obesity, unspecified: Secondary | ICD-10-CM | POA: Diagnosis present

## 2011-02-28 DIAGNOSIS — G4733 Obstructive sleep apnea (adult) (pediatric): Secondary | ICD-10-CM | POA: Diagnosis present

## 2011-02-28 DIAGNOSIS — J45901 Unspecified asthma with (acute) exacerbation: Principal | ICD-10-CM | POA: Diagnosis present

## 2011-02-28 DIAGNOSIS — N390 Urinary tract infection, site not specified: Secondary | ICD-10-CM | POA: Diagnosis present

## 2011-02-28 DIAGNOSIS — I517 Cardiomegaly: Secondary | ICD-10-CM | POA: Diagnosis present

## 2011-02-28 DIAGNOSIS — I10 Essential (primary) hypertension: Secondary | ICD-10-CM | POA: Diagnosis present

## 2011-02-28 DIAGNOSIS — B353 Tinea pedis: Secondary | ICD-10-CM | POA: Diagnosis present

## 2011-02-28 DIAGNOSIS — I509 Heart failure, unspecified: Secondary | ICD-10-CM | POA: Diagnosis present

## 2011-02-28 LAB — DIFFERENTIAL
Basophils Absolute: 0
Basophils Absolute: 0 10*3/uL (ref 0.0–0.1)
Basophils Relative: 0
Basophils Relative: 0 % (ref 0–1)
Eosinophils Absolute: 0.3
Eosinophils Absolute: 0.2
Eosinophils Absolute: 0.2 10*3/uL (ref 0.0–0.7)
Eosinophils Relative: 3
Eosinophils Relative: 2
Eosinophils Relative: 4
Eosinophils Relative: 2 % (ref 0–5)
Lymphocytes Relative: 15
Lymphs Abs: 2.5
Lymphs Abs: 1.5
Monocytes Absolute: 0.9
Monocytes Absolute: 0.8
Monocytes Absolute: 0.8
Monocytes Relative: 8
Monocytes Relative: 11
Neutro Abs: 3.9

## 2011-02-28 LAB — CARDIAC PANEL(CRET KIN+CKTOT+MB+TROPI)
CK, MB: 1.2
Relative Index: 1.4
Relative Index: INVALID
Relative Index: INVALID
Total CK: 114
Total CK: 91
Total CK: 103
Total CK: 84
Troponin I: 0.02
Troponin I: 0.01
Troponin I: <0.01
Troponin I: <0.01
Troponin I: 0.01

## 2011-02-28 LAB — I-STAT 8, (EC8 V) (CONVERTED LAB)
Acid-base deficit: 2
BUN: 15
Glucose, Bld: 111 — ABNORMAL HIGH
HCT: 46
Hemoglobin: 15.3
Operator id: 161631
Potassium: 3.9
Potassium: 4.1
Sodium: 141
Sodium: 140
TCO2: 24
pH, Ven: 7.406 — ABNORMAL HIGH
pH, Ven: 7.43 — ABNORMAL HIGH

## 2011-02-28 LAB — HEPATIC FUNCTION PANEL
ALT: 31
AST: 20
Alkaline Phosphatase: 52
Bilirubin, Direct: 0.1
Total Bilirubin: 0.4

## 2011-02-28 LAB — CBC
HCT: 44.3
HCT: 41.7
HCT: 38.4 — ABNORMAL LOW
Hemoglobin: 14.7
Hemoglobin: 14.2
MCHC: 33.3
MCHC: 34.9
MCV: 87.3
MCV: 86.8
MCV: 84.8
Platelets: 234
Platelets: 300
Platelets: 272
Platelets: 263 10*3/uL (ref 150–400)
RBC: 5.07
RDW: 13.4
RDW: 13.9
RDW: 14.5
RDW: 14.2 % (ref 11.5–15.5)
WBC: 11.2 — ABNORMAL HIGH
WBC: 10.2
WBC: 7
WBC: 10.3 10*3/uL (ref 4.0–10.5)

## 2011-02-28 LAB — LIPASE, BLOOD: Lipase: 26

## 2011-02-28 LAB — COMPREHENSIVE METABOLIC PANEL
AST: 18
AST: 18 U/L (ref 0–37)
Albumin: 2.8 — ABNORMAL LOW
Albumin: 3 — ABNORMAL LOW
Albumin: 3.2 g/dL — ABNORMAL LOW (ref 3.5–5.2)
Alkaline Phosphatase: 45
Alkaline Phosphatase: 55 U/L (ref 39–117)
BUN: 16
Calcium: 8.5
Chloride: 102
Chloride: 107
Chloride: 104 mEq/L (ref 96–112)
Creatinine, Ser: 1.03
GFR calc Af Amer: >60
GFR calc Af Amer: >60
Potassium: 3.9
Potassium: 3.7 mEq/L (ref 3.5–5.1)
Sodium: 136
Total Bilirubin: 0.3
Total Bilirubin: 0.4
Total Bilirubin: 0.2 mg/dL — ABNORMAL LOW (ref 0.3–1.2)
Total Protein: 5.8 — ABNORMAL LOW
Total Protein: 6.6 g/dL (ref 6.0–8.3)

## 2011-02-28 LAB — POCT CARDIAC MARKERS
CKMB, poc: <1 — ABNORMAL LOW
Myoglobin, poc: 56.4
Myoglobin, poc: 63.7
Operator id: 272551
Troponin i, poc: <0.05

## 2011-02-28 LAB — RAPID URINE DRUG SCREEN, HOSP PERFORMED
Benzodiazepines: POSITIVE — AB
Cocaine: NOT DETECTED

## 2011-02-28 LAB — PROTIME-INR
INR: 1
Prothrombin Time: 13.4

## 2011-02-28 LAB — URINALYSIS, ROUTINE W REFLEX MICROSCOPIC
Glucose, UA: NEGATIVE
Hgb urine dipstick: NEGATIVE
Ketones, ur: NEGATIVE
Protein, ur: NEGATIVE

## 2011-02-28 LAB — POCT I-STAT CREATININE
Creatinine, Ser: 1.1
Creatinine, Ser: 1
Operator id: 161631
Operator id: 161631

## 2011-02-28 LAB — CK TOTAL AND CKMB (NOT AT ARMC): Total CK: 103

## 2011-02-28 LAB — URINE MICROSCOPIC-ADD ON

## 2011-02-28 LAB — LIPID PANEL
HDL: 27 — ABNORMAL LOW
Total CHOL/HDL Ratio: 5.9

## 2011-02-28 LAB — URINE CULTURE: Colony Count: 6000

## 2011-02-28 LAB — TSH: TSH: 2.066

## 2011-03-01 ENCOUNTER — Inpatient Hospital Stay (HOSPITAL_COMMUNITY): Payer: Self-pay

## 2011-03-01 LAB — URINALYSIS, ROUTINE W REFLEX MICROSCOPIC
Ketones, ur: NEGATIVE mg/dL
Protein, ur: NEGATIVE mg/dL
Urobilinogen, UA: 1 mg/dL (ref 0.0–1.0)

## 2011-03-01 LAB — DRUGS OF ABUSE SCREEN W/O ALC, ROUTINE URINE
Cocaine Metabolites: NEGATIVE
Creatinine,U: 41.9 mg/dL
Marijuana Metabolite: NEGATIVE
Opiate Screen, Urine: NEGATIVE
Propoxyphene: NEGATIVE

## 2011-03-01 LAB — PRO B NATRIURETIC PEPTIDE: Pro B Natriuretic peptide (BNP): 12.4 pg/mL (ref 0–125)

## 2011-03-01 LAB — CK TOTAL AND CKMB (NOT AT ARMC)
CK, MB: 2.6 ng/mL (ref 0.3–4.0)
Total CK: 200 U/L (ref 7–232)

## 2011-03-01 LAB — CARDIAC PANEL(CRET KIN+CKTOT+MB+TROPI)
CK, MB: 2.6 ng/mL (ref 0.3–4.0)
CK, MB: 2.7 ng/mL (ref 0.3–4.0)
Relative Index: 1.3 (ref 0.0–2.5)
Total CK: 207 U/L (ref 7–232)
Troponin I: <0.3 ng/mL (ref <0.30)

## 2011-03-01 LAB — RAPID URINE DRUG SCREEN, HOSP PERFORMED
Amphetamines: NOT DETECTED
Benzodiazepines: NOT DETECTED
Cocaine: NOT DETECTED
Opiates: NOT DETECTED
Tetrahydrocannabinol: POSITIVE — AB

## 2011-03-01 LAB — URINE MICROSCOPIC-ADD ON

## 2011-03-01 LAB — LIPASE, BLOOD: Lipase: 35 U/L (ref 11–59)

## 2011-03-01 LAB — POCT I-STAT TROPONIN I: Troponin i, poc: 0 ng/mL (ref 0.00–0.08)

## 2011-03-01 MED ORDER — IOHEXOL 350 MG/ML SOLN
150.0000 mL | Freq: Once | INTRAVENOUS | Status: AC | PRN
  Administered 2011-03-01: 150 mL via INTRAVENOUS

## 2011-03-02 ENCOUNTER — Inpatient Hospital Stay (HOSPITAL_COMMUNITY): Payer: Self-pay

## 2011-03-02 DIAGNOSIS — R748 Abnormal levels of other serum enzymes: Secondary | ICD-10-CM

## 2011-03-02 DIAGNOSIS — R109 Unspecified abdominal pain: Secondary | ICD-10-CM

## 2011-03-02 DIAGNOSIS — R112 Nausea with vomiting, unspecified: Secondary | ICD-10-CM

## 2011-03-02 LAB — CBC
MCV: 84.1 fL (ref 78.0–100.0)
Platelets: 301 10*3/uL (ref 150–400)
RBC: 4.96 MIL/uL (ref 4.22–5.81)
RDW: 14.2 % (ref 11.5–15.5)
WBC: 13.9 10*3/uL — ABNORMAL HIGH (ref 4.0–10.5)

## 2011-03-02 LAB — BASIC METABOLIC PANEL
CO2: 27 mEq/L (ref 19–32)
Chloride: 102 mEq/L (ref 96–112)
Creatinine, Ser: 0.82 mg/dL (ref 0.50–1.35)
GFR calc Af Amer: >60 mL/min (ref >60)
Sodium: 137 mEq/L (ref 135–145)

## 2011-03-02 NOTE — H&P (Signed)
NAME:  STEIN, WINDHORST NO.:  [id number]  MEDICAL RECORD NO.:  [id number]  LOCATION:  [facility]                         FACILITY:  [facility]  PHYSICIAN:  Tarry Kos, MD       DATE OF BIRTH:  12/15/1970  DATE OF ADMISSION:  02/28/2011 DATE OF DISCHARGE:                             HISTORY & PHYSICAL   CHIEF COMPLAINT:  Periumbilical abdominal pain and shortness of breath.  HISTORY OF PRESENT ILLNESS:  Mr. Richard Anderson is a 40 year old male with a history of obstructive sleep apnea, that is recently diagnosed who has not been fitted for CPAP machine, asthma, morbid obesity, hypertension, history of polysubstance abuse who presents to the emergency department after several days of periumbilical abdominal pain.  He really has a multitude of complaints, but his real issues are his abdominal pain and his shortness of breath that he says has gotten worse.  He feels like his shortness of breath is not related to his asthma.  It is worse when he lies down flat and he has also been very nauseous and having some periumbilical abdominal pain without any vomiting.  He denies running any fevers.  He was recently diagnosed with obstructive sleep apnea, but he has not been fitted for CPAP.  He also is complaining of some lower extremity edema that has worsened over the last several days.  He does not have a history of heart failure, does not have a history of coronary artery disease.  PAST MEDICAL HISTORY: 1. Obstructive sleep apnea. 2. Morbid obesity. 3. Asthma. 4. Hypertension. 5. Diabetes. 6. Heart catheterization in February 2009, which showed no significant     coronary artery disease. 7. He also had a 2-D echo on a recent hospitalization in July 2012,     for which he was admitted for chest pain.  His EF of 60-65%, no     regional wall abnormalities. 8. History of polysubstance abuse. 9. Anxiety disorder with depression. 10.History of GERD.  MEDICATIONS:  He takes Advair,  lisinopril, and albuterol as needed.  SOCIAL HISTORY:  He is a smoker.  He denies alcohol.  No IV drug abuse. He is trying to get Medicaid disability right now.  He does not have any health insurance.  ALLERGIES:  None.  PHYSICAL EXAMINATION:  VITAL SIGNS:  Temperature 98.7, blood pressure 147/75, pulse 90, respirations 19, 95% O2 sats on room air. GENERAL:  He is alert and oriented x4.  No apparent distress, cooperative and friendly. HEENT:  Extraocular muscles intact.  Pupils equal, round, and reactive to light.  Oropharynx clear.  Mucous membranes moist. NECK:  No JVD.  No carotid bruits. COR:  Regular rate and rhythm without murmurs, rubs, or gallops. CHEST:  Clear to auscultation bilaterally.  No wheezes, rhonchi, or rales. ABDOMEN:  Soft, it is mildly tender to palpation in the periumbilical area.  There is no hernia palpated.  It is nondistended.  Positive bowel sounds.  No rebound, no guarding. EXTREMITIES:  No clubbing, cyanosis, he has got trace edema in bilateral lower extremities. SKIN:  No rashes. NEUROLOGIC:  No focal neurologic deficits.  LABS:  His lipase is elevated at 178.  His LFTs are normal. Electrolytes  normal.  BUN and creatinine normal.  White count is normal. Hemoglobin normal.  A 12-lead EKG is pending.  Cardiac enzymes are pending that have been ordered by the ED.  ASSESSMENT AND PLAN:  This is a 40 year old male who presents with likely new onset mild congestive heart failure and acute pancreatitis. 1. Acute pancreatitis which seems to be mild.  I am not going to     aggressively hydrate him with fluids secondary to his heart     failure.  We will repeat lipase in the morning. 2. Mild congestive heart failure, we will provide him with some Lasix.     I think his heart failure is his main issue right now.  We will     check a 2-D echo.  BNP, cardiac enzymes, and 12-lead EKG are all     pending. 3. History of polysubstance abuse.  We will check urine  drug screen. 4. History of obstructive sleep apnea.  He needs to be fitted for     continuous positive airway pressure machine. 5. Morbid obesity, also contributing to the above issues. 6. We will serial his cardiac enzymes and keep on n.p.o. 7. Provide him with some Zofran and Dilaudid as needed and along with     baby aspirin.  Further recommendation pending over hospital course.          ______________________________ Tarry Kos, MD     RD/MEDQ  D:  02/28/2011  T:  03/01/2011  Job:  [id number]  Electronically Signed by Tarry Kos MD on 03/02/2011 10:49:41 AM

## 2011-03-03 ENCOUNTER — Inpatient Hospital Stay (HOSPITAL_COMMUNITY): Payer: Self-pay

## 2011-03-03 LAB — DIFFERENTIAL
Basophils Absolute: 0
Basophils Absolute: 0.1
Basophils Relative: 1
Basophils Relative: 1
Eosinophils Absolute: 0.1
Eosinophils Relative: 3
Lymphocytes Relative: 22
Monocytes Absolute: 0.6
Neutro Abs: 2.6
Neutro Abs: 3.6
Neutrophils Relative %: 58

## 2011-03-03 LAB — CBC
Hemoglobin: 14.1
MCHC: 34.3
MCV: 85.7
Platelets: 283
RBC: 4.77
WBC: 4.3

## 2011-03-03 LAB — URINE CULTURE: Colony Count: >=100000

## 2011-03-03 LAB — URINALYSIS, ROUTINE W REFLEX MICROSCOPIC
Bilirubin Urine: NEGATIVE
Glucose, UA: NEGATIVE
Hgb urine dipstick: NEGATIVE
Ketones, ur: NEGATIVE
Nitrite: NEGATIVE
Nitrite: NEGATIVE
Protein, ur: NEGATIVE
Protein, ur: NEGATIVE
Urobilinogen, UA: 0.2
pH: 5.5

## 2011-03-03 LAB — COMPREHENSIVE METABOLIC PANEL
ALT: 37
AST: 29
Alkaline Phosphatase: 45
CO2: 22
Chloride: 110
GFR calc Af Amer: >60
GFR calc non Af Amer: >60
Glucose, Bld: 105 — ABNORMAL HIGH
Sodium: 140
Total Bilirubin: 0.4

## 2011-03-03 LAB — POCT I-STAT, CHEM 8
HCT: 47
Hemoglobin: 16
Potassium: 3.7
Sodium: 141

## 2011-03-03 LAB — LIPASE, BLOOD
Lipase: 19
Lipase: 21

## 2011-03-03 LAB — URINE MICROSCOPIC-ADD ON

## 2011-03-04 LAB — URINALYSIS, ROUTINE W REFLEX MICROSCOPIC
Glucose, UA: NEGATIVE
Protein, ur: NEGATIVE
Urobilinogen, UA: 0.2

## 2011-03-04 LAB — BASIC METABOLIC PANEL
CO2: 30 mEq/L (ref 19–32)
Calcium: 8.6 mg/dL (ref 8.4–10.5)
Chloride: 103 mEq/L (ref 96–112)
Potassium: 3.5 mEq/L (ref 3.5–5.1)
Sodium: 141 mEq/L (ref 135–145)

## 2011-03-04 LAB — CBC
MCH: 27.7 pg (ref 26.0–34.0)
Platelets: 324 10*3/uL (ref 150–400)
RBC: 5.01 MIL/uL (ref 4.22–5.81)
WBC: 13.5 10*3/uL — ABNORMAL HIGH (ref 4.0–10.5)

## 2011-03-04 LAB — POCT CARDIAC MARKERS
CKMB, poc: 1.4
Myoglobin, poc: 104
Operator id: 294511
Troponin i, poc: <0.05

## 2011-03-04 LAB — URINE MICROSCOPIC-ADD ON

## 2011-03-06 ENCOUNTER — Emergency Department (HOSPITAL_COMMUNITY)
Admission: EM | Admit: 2011-03-06 | Discharge: 2011-03-06 | Disposition: A | Discharge status: Home / Self Care | Payer: Self-pay | Attending: Emergency Medicine | Admitting: Emergency Medicine

## 2011-03-06 ENCOUNTER — Emergency Department (HOSPITAL_COMMUNITY): Payer: Self-pay

## 2011-03-06 DIAGNOSIS — R112 Nausea with vomiting, unspecified: Secondary | ICD-10-CM | POA: Insufficient documentation

## 2011-03-06 DIAGNOSIS — R109 Unspecified abdominal pain: Secondary | ICD-10-CM | POA: Insufficient documentation

## 2011-03-06 DIAGNOSIS — K5289 Other specified noninfective gastroenteritis and colitis: Secondary | ICD-10-CM | POA: Insufficient documentation

## 2011-03-06 DIAGNOSIS — G8929 Other chronic pain: Secondary | ICD-10-CM | POA: Insufficient documentation

## 2011-03-06 DIAGNOSIS — Z79899 Other long term (current) drug therapy: Secondary | ICD-10-CM | POA: Insufficient documentation

## 2011-03-06 LAB — COMPREHENSIVE METABOLIC PANEL
ALT: 31 U/L (ref 0–53)
AST: 22 U/L (ref 0–37)
CO2: 27 mEq/L (ref 19–32)
Chloride: 107 mEq/L (ref 96–112)
GFR calc non Af Amer: >60 mL/min (ref >60)
Sodium: 143 mEq/L (ref 135–145)
Total Bilirubin: 0.2 mg/dL — ABNORMAL LOW (ref 0.3–1.2)

## 2011-03-06 LAB — DIFFERENTIAL
Basophils Absolute: 0 10*3/uL (ref 0.0–0.1)
Basophils Relative: 0 % (ref 0–1)
Neutro Abs: 10.1 10*3/uL — ABNORMAL HIGH (ref 1.7–7.7)
Neutrophils Relative %: 73 % (ref 43–77)

## 2011-03-06 LAB — CBC
Hemoglobin: 14.2 g/dL (ref 13.0–17.0)
RBC: 5.05 MIL/uL (ref 4.22–5.81)
WBC: 13.8 10*3/uL — ABNORMAL HIGH (ref 4.0–10.5)

## 2011-03-06 LAB — LACTIC ACID, PLASMA: Lactic Acid, Venous: 0.9 mmol/L (ref 0.5–2.2)

## 2011-03-13 NOTE — Consult Note (Signed)
NAME:  JERY, HOLLERN NO.:  [id number]  MEDICAL RECORD NO.:  [id number]  LOCATION:  4707                         FACILITY:  [facility]  PHYSICIAN:  Iva Boop, MD,FACGDATE OF BIRTH:  1970-09-30  DATE OF CONSULTATION: DATE OF DISCHARGE:                                CONSULTATION   REQUESTING PHYSICIAN:  The hospitalist, he is an unassigned patient with no primary gastroenterologist.  Primary care is [facility].  REASON FOR CONSULTATION:  Periumbilical abdominal pain.  ASSESSMENT:  Abdominal pain, 4-day history to the right of the umbilicus and at the umbilicus.  It is mainly postprandial.  He had a transient elevation of lipase to 178 which was about three times abnormal.  CT scan of the abdomen and pelvis has shown fatty liver, a right parapelvic kidney cyst, and no other significant findings.  He has some CVA tenderness on the right as well as tenderness in the right mid quadrant area.  I am not sure what the cause of his pain which is associated with nausea and vomiting is.  I am suspicious of possible pyelonephritis as the hospitalist is though a gallbladder problem could cause this as well and a CT scan may not show gallstones.  He does not appear to have cholecystitis.  PLAN:  Abdominal ultrasound to evaluate for the above.  Would continue the antibiotics and symptomatic relief.  He has underlying psychiatric disturbances which exacerbate things as he is very anxious.  He also has asthma, which is being treated.  Further plan is pending clinical course.  The on call unassigned GI Team with Dr. Loreta Ave and Elnoria Howard will take care of the patient tomorrow.  HISTORY:  This is a 40 year old morbidly obese white man who was admitted with a several-day history of periumbilical abdominal pain.  He is describing a cramping twisting type of pain that is there off and on but really severe after eating and he vomits.  He has been unable to  eat and tolerate clear liquids.  He has had some urinary disturbance and dysuria as well as lower extremity edema prior to coming in.  There has been no fever.  His lipase was elevated to 178 as mentioned, he had a CT scan.  He has been treated with antibiotics and is perhaps somewhat symptomatically better but is really still having problems as outlined above.  He has some intermittent heartburn symptoms, he usually drinks milk to help with that.  He denies alcohol use for many years, and he has not been abusing drugs anymore as he had in the past.  He denies dysphagia.  His bowels have been little soft but no great disturbance there.  HOSPITAL MEDICATIONS:  Albuterol, aspirin, azithromycin, ceftriaxone, enoxaparin, furosemide, Dilaudid, Atrovent, prednisone 40 mg daily and p.r.n. medications.  ALLERGIES:  HALDOL which has caused tardive dyskinesia and TRAZODONE which has caused renal failure.  HOME MEDICATIONS:  Advair, lisinopril and albuterol.  PAST MEDICAL HISTORY: 1. Morbid obesity. 2. Obstructive sleep apnea. 3. Asthma. 4. Hypertension. 5. Diabetes. 6. Chest pain evaluations at admission with negative catheterization,     normal 2-D echo. 7. History of polysubstance abuse. 8. Anxiety disorder with depression and  previous psychiatric     admissions. 9. Reported GERD.  PAST SURGICAL HISTORY:  He also has a history of multiple knee surgeries and chronic knee pain.  SOCIAL HISTORY:  He has been a smoker.  Alcohol, drugs as above. Pursuing Medicaid disability.  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  He is uninsured applying for disability.  He lives with a cousin and a roommate.  FAMILY HISTORY:  Father premature coronary artery disease, died of an motor vehicle accident.  Mother died at age 55 sounds like an aneurysm rupture, abdominal aortic aneurysm, a brother had a stroke in young age.  REVIEW OF SYSTEMS:  As outlined above.   All other review of systems negative or as above.  PHYSICAL EXAMINATION:  GENERAL:  Morbidly obese white man.  He is in no acute distress.  He is mildly anxious though he has been given Ativan today. VITAL SIGNS:  Temperature 97.3, pulse 94, blood pressure 135/94, respirations 17. HEENT:  The eyes are anicteric.  The teeth are in poor repair.  He is missing some as well.  The mouth and posterior pharynx free of lesions. NECK:  Supple without mass. CHEST:  Notable for prolonged expiratory phases and expiratory wheezes with fair air movement at best. HEART:  Sounds are distant S1-S2.  No murmurs or gallops. ABDOMEN:  Obese, soft and tender in the right middle area, to the right of the umbilicus and some pain there as well.  There is no obvious organomegaly or mass.  There is no obvious hernia though obesity precludes a very accurate exam particularly for mass.  He has right- sided CVA tenderness. LOWER EXTREMITIES:  Trace edema. SKIN:  Multiple tattoos. PSYCH:  Mood is mildly anxious and slightly agitated, but he is overall pleasant and cooperative.  LABORATORY DATA:  Lipase as mentioned above quickly came back to normal. He has glucose of 142 but it is otherwise normal.  BMET today his white count is 13.9, his hemoglobin is 13.6, hematocrit 41.7, platelets 301. Drug screen negative.  Cardiac enzymes negative.  He has 21-50 white cells and 0-2 red cells in his urinalysis as well as positive blood, positive nitrite.  Brain natriuretic peptide 12.4.  CT scan abdomen and pelvis with findings as outlined above.  He had a fall in the hospital here and a left knee film shows mild tricompartmental degenerative spurring.    Iva Boop, MD,FACG    CEG/MEDQ  D:  03/02/2011  T:  03/02/2011  Job:  [id number]  Electronically Signed by Stan Head MDFACG on 03/13/2011 10:05:35 AM

## 2011-03-13 NOTE — Discharge Summary (Addendum)
NAME:  Richard Anderson, Richard Anderson NO.:  [id number]  MEDICAL RECORD NO.:  [id number]  LOCATION:  [facility]                         FACILITY:  [facility]  PHYSICIAN:  Andreas Blower, MD       DATE OF BIRTH:  [birth year]  DATE OF ADMISSION:  02/28/2011 DATE OF DISCHARGE:  03/05/2011                        DISCHARGE SUMMARY - REFERRING   PRIMARY CARE PHYSICIAN:  [facility].  PRIMARY CARDIOLOGIST:  Ricki Rodriguez, MD  The patient left against medical advice on March 05, 2011.  DISCHARGE DIAGNOSES: 1. Abdominal pain, etiology unclear, improved during the course of     hospital stay. 2. Shortness of breath thought to be secondary to acute chronic     obstructive pulmonary disease exacerbation. 3. Urinary tract infection. 4. History of polysubstance abuse with cocaine and urine drug screen     positive for THC. 5. History of diastolic heart failure. 6. Type 2 diabetes. 7. Obstructive sleep apnea. 8. History of anxiety. 9. History of gastroesophageal reflux disease. 10.Diabetes. 11.Morbid obesity. 12.History of cardiac catheterization in February 2009 which showed     significant coronary artery disease with 2-D echocardiogram in July     2012 when admitted for chest pain which showed ejection fraction of     60-65%, no regional wall motion abnormalities.  DISCHARGE MEDICATIONS:  Could not be arranged as the patient left against medical advice.  BRIEF ADMITTING HISTORY AND PHYSICAL:  Richard Anderson is a 40 year old male with history of obstructive sleep apnea who has not been fitted for CPAP machine, asthma, morbid obesity, hypertension, history of polysubstance abuse who presented to the emergency department on March 01, 2011 with complaints of periumbilical abdominal pain.  RADIOLOGY/IMAGING: 1. The patient abdominal series on February 28, 2011 which showed     unremarkable bowel gas pattern.  No free intraabdominal air seen.     Vascular  congestion and borderline cardiomegaly with mildly     increased interstitial markings which raised concern for her     interstitial edema. 2. The patient had CT angiogram of the chest with contrast which shows     no evidence for acute pulmonary embolism. 3. The patient had CT of the abdomen and pelvis with contrast which     showed no acute findings within the abdomen or pelvis.  Fatty     infiltration of the liver noted. 4. The patient had x-ray of the left kidney which showed mild     tricompartment degenerative spurring.  No acute abnormality noted. 5. The patient had ultrasound of the abdomen which showed fatty     infiltration of the liver.  No ductal dilatation.  No gallstones.     Probable complex cyst in the right mid kidney.  This appears stable     since 2008 and most likely represents a benign process.  Pancreas     is obscured by bowel gas. 6. The patient had abdominal x-ray on March 06, 2011 which showed     normal bowel loops, gas pattern.  LABORATORY DATA:  CBC shows a white count of 13.5, hemoglobin 13.9, hematocrit 43.2, platelet count 324,000.  Electrolytes normal with a BUN of 24, creatinine 0.86.  HOSPITAL COURSE:  Richard Anderson was admitted on March 01, 2011.  The patient had multiple imaging both CT and abdominal ultrasound which could not explain the reason for abdominal pain.  The patient initially was started on clear liquid diet, however, did not tolerate that well. Dr. Leone Payor evaluated the patient in place of Dr. Elnoria Howard, who was on for unassigned.  Dr. Leone Payor recommended getting an abdominal ultrasound which was done with the results as indicated above.  The patient was started on clear liquid diet and diet was advanced as tolerated.  It is also uncertain if the patient had abdominal pain from possible pyelonephritis though abdominal CT does not support finding.  The patient initially was started on ceftriaxone for urinary tract infection and  antibiotics were transitioned to Keflex p.o.  Unfortunately urine culture was not done obtained during the course of hospital stay. Patient also had episodes of shortness of breath initially during the course of hospital stay thought to be due to acute COPD exacerbation.  The patient was started on a steroid taper with good improvement in his breathing. Given the patient's tobacco use, the patient was encouraged smoking cessation and tobacco cessation counseling was also obtained.  Type 2 diabetes, blood sugars were stable during the course of hospital stay. Given obstructive sleep apnea, the patient also needs outpatient CPAP machine to be fitted for use.  I went off service on March 04, 2011.  Dr. Ardyth Harps was on service on March 05, 2011 who did not see the patient prior to the patient leaving AMA.  The patient on March 05, 2011 indicated that he no longer could wait to be seen by doctor on that day and had to leave now.  AMA papers were in the process of being prepared for the patient's sign, however, the patient left before the papers were signed.  The patient also left before the IV could be removed.   Andreas Blower, MD SR/MEDQ  D:  03/12/2011  T:  03/12/2011  Job:  [id number]  Electronically Signed by Wardell Heath Tyrann Donaho  on 03/16/2011 08:45:33 PM

## 2011-03-17 LAB — DIFFERENTIAL
Basophils Absolute: 0.1
Basophils Relative: 1
Eosinophils Absolute: 0.1 — ABNORMAL LOW
Eosinophils Relative: 1
Lymphocytes Relative: 17
Lymphs Abs: 1.3
Monocytes Absolute: 0.6
Monocytes Relative: 8
Neutro Abs: 5.9
Neutrophils Relative %: 74

## 2011-03-17 LAB — BASIC METABOLIC PANEL
BUN: 15
CO2: 27
Calcium: 8.6
Chloride: 104
Creatinine, Ser: 1.16
GFR calc Af Amer: >60
GFR calc non Af Amer: >60
Glucose, Bld: 92
Potassium: 3.7
Sodium: 138

## 2011-03-17 LAB — CBC
HCT: 41.4
Hemoglobin: 14.1
MCHC: 34
MCV: 85.9
Platelets: 252
RBC: 4.82
RDW: 13.6
WBC: 8.1

## 2011-03-17 LAB — URINE MICROSCOPIC-ADD ON

## 2011-03-17 LAB — URINALYSIS, ROUTINE W REFLEX MICROSCOPIC
Bilirubin Urine: NEGATIVE
Glucose, UA: NEGATIVE
Hgb urine dipstick: NEGATIVE
Ketones, ur: NEGATIVE
Nitrite: NEGATIVE
Protein, ur: NEGATIVE
Specific Gravity, Urine: 1.025
Urobilinogen, UA: 1
pH: 6

## 2011-03-21 LAB — URINE CULTURE: Colony Count: 70000

## 2011-03-21 LAB — URINALYSIS, ROUTINE W REFLEX MICROSCOPIC
Bilirubin Urine: NEGATIVE
Ketones, ur: 15 — AB
Nitrite: NEGATIVE
Specific Gravity, Urine: 1.023
Urobilinogen, UA: 0.2

## 2011-03-21 LAB — URINE MICROSCOPIC-ADD ON

## 2011-03-24 LAB — DIFFERENTIAL
Lymphocytes Relative: 19
Lymphs Abs: 1.9
Neutrophils Relative %: 70

## 2011-03-24 LAB — URINE CULTURE

## 2011-03-24 LAB — I-STAT 8, (EC8 V) (CONVERTED LAB)
Acid-base deficit: 2
Hemoglobin: 14.6
Operator id: 277751
Potassium: 3.4 — ABNORMAL LOW
Sodium: 142
TCO2: 22

## 2011-03-24 LAB — POCT I-STAT CREATININE
Creatinine, Ser: 0.9
Operator id: 277751

## 2011-03-24 LAB — CBC
Platelets: 369
WBC: 9.8

## 2011-03-24 LAB — URINALYSIS, ROUTINE W REFLEX MICROSCOPIC
Bilirubin Urine: NEGATIVE
Ketones, ur: 15 — AB
Nitrite: NEGATIVE
pH: 5.5

## 2011-03-24 LAB — URINE MICROSCOPIC-ADD ON

## 2011-05-05 ENCOUNTER — Encounter (HOSPITAL_COMMUNITY): Payer: Self-pay | Admitting: Emergency Medicine

## 2011-05-05 ENCOUNTER — Emergency Department (HOSPITAL_COMMUNITY)
Admission: EM | Admit: 2011-05-05 | Discharge: 2011-05-06 | Discharge status: Against Medical Advice | Payer: Self-pay | Attending: Emergency Medicine | Admitting: Emergency Medicine

## 2011-05-05 DIAGNOSIS — R109 Unspecified abdominal pain: Secondary | ICD-10-CM | POA: Insufficient documentation

## 2011-05-05 NOTE — ED Notes (Signed)
PT. REPORTS PROGRESSING RIGHT GROIN PAIN ONSET THIS AFTERNOON , DENIES INJURY OR FALL , NO DYSURIA.

## 2011-05-06 ENCOUNTER — Emergency Department (HOSPITAL_COMMUNITY)
Admission: EM | Admit: 2011-05-06 | Discharge: 2011-05-06 | Disposition: A | Discharge status: Home / Self Care | Payer: Self-pay | Attending: Emergency Medicine | Admitting: Emergency Medicine

## 2011-05-06 ENCOUNTER — Encounter (HOSPITAL_COMMUNITY): Payer: Self-pay | Admitting: *Deleted

## 2011-05-06 DIAGNOSIS — I10 Essential (primary) hypertension: Secondary | ICD-10-CM | POA: Insufficient documentation

## 2011-05-06 DIAGNOSIS — X500XXA Overexertion from strenuous movement or load, initial encounter: Secondary | ICD-10-CM | POA: Insufficient documentation

## 2011-05-06 DIAGNOSIS — R109 Unspecified abdominal pain: Secondary | ICD-10-CM | POA: Insufficient documentation

## 2011-05-06 DIAGNOSIS — IMO0002 Reserved for concepts with insufficient information to code with codable children: Secondary | ICD-10-CM | POA: Insufficient documentation

## 2011-05-06 DIAGNOSIS — S3991XA Unspecified injury of abdomen, initial encounter: Secondary | ICD-10-CM

## 2011-05-06 DIAGNOSIS — Z79899 Other long term (current) drug therapy: Secondary | ICD-10-CM | POA: Insufficient documentation

## 2011-05-06 MED ORDER — HYDROCODONE-ACETAMINOPHEN 5-325 MG PO TABS: 2.0000 | ORAL_TABLET | Freq: Four times a day (QID) | ORAL | Status: AC | PRN

## 2011-05-06 MED ORDER — HYDROCODONE-ACETAMINOPHEN 5-325 MG PO TABS
2.0000 | ORAL_TABLET | Freq: Once | ORAL | Status: AC
  Administered 2011-05-06: 2 via ORAL
  Filled 2011-05-06: qty 2

## 2011-05-06 NOTE — ED Notes (Signed)
Pt was blowing leaves yesterday and tripped in a hole and now with pain to right groin area.

## 2011-05-06 NOTE — ED Notes (Signed)
Pt presents to department for evaluation of groin pain. States he was blowing leaves yesterday and stepped in a hole. 5/10 pain at the time. Ambulatory to stretcher triage. No other injuries noted. Pt conscious alert and oriented x4. No signs of distress at the present.

## 2011-05-06 NOTE — ED Provider Notes (Signed)
History     CSN: [id number] Arrival date & time: 05/06/2011 12:12 PM   First MD Initiated Contact with Patient 05/06/11 1619      Chief Complaint  Patient presents with  . Groin Pain    right    (Consider location/radiation/quality/duration/timing/severity/associated sxs/prior treatment) Patient is a 40 y.o. male presenting with groin pain.  Groin Pain   Patient is a morbidly obese and poorly kempt gentleman who presents complaining of right groin pain since stumbling while blowing leaves several days ago.  Patient has no testicular pain, no history of inguinal hernia, no constipation, diarrhea, nausea, vomiting, fevers, or urinary symptoms. His pain is a 10/10, better with rest, worse with walking, and unchanged with OTC meds.  There are no other associated or modifying factors. Past Medical History  Diagnosis Date  . Hypertension     History reviewed. No pertinent past surgical history.  History reviewed. No pertinent family history.  History  Substance Use Topics  . Smoking status: Current Everyday Smoker  . Smokeless tobacco: Not on file  . Alcohol Use: No      Review of Systems  Constitutional: Negative.   HENT: Negative.   Eyes: Negative.   Respiratory: Negative.   Cardiovascular: Negative.   Gastrointestinal: Negative.   Genitourinary: Negative.   Musculoskeletal: Negative.        Right groin pain  Skin: Negative.   Neurological: Negative.   Hematological: Negative.   Psychiatric/Behavioral: Negative.   All other systems reviewed and are negative.    Allergies  Review of patient's allergies indicates no known allergies.  Home Medications   Current Outpatient Rx  Name Route Sig Dispense Refill  . ALBUTEROL SULFATE HFA 108 (90 BASE) MCG/ACT IN AERS Inhalation Inhale 2 puffs into the lungs every 6 (six) hours as needed. For shortness of breath.     . CLONAZEPAM 2 MG PO TABS Oral Take 2 mg by mouth 3 (three) times daily.      Marland Kitchen  FLUTICASONE-SALMETEROL 500-50 MCG/DOSE IN AEPB Inhalation Inhale 2 puffs into the lungs every 12 (twelve) hours.      Marland Kitchen LISINOPRIL 10 MG PO TABS Oral Take 10 mg by mouth daily.      Marland Kitchen HYDROCODONE-ACETAMINOPHEN 5-325 MG PO TABS Oral Take 2 tablets by mouth every 6 (six) hours as needed for pain. 10 tablet 0    BP 140/97  Pulse 88  Temp(Src) 98.6 F (37 C) (Oral)  Resp 18  SpO2 99%  Physical Exam  Nursing note and vitals reviewed. Constitutional: He is oriented to person, place, and time. He appears well-developed and well-nourished. No distress.  HENT:  Head: Normocephalic and atraumatic.  Eyes: Conjunctivae and EOM are normal. Pupils are equal, round, and reactive to light.  Neck: Normal range of motion.  Cardiovascular: Normal rate, regular rhythm, normal heart sounds and intact distal pulses.  Exam reveals no gallop and no friction rub.   No murmur heard. Pulmonary/Chest: Effort normal and breath sounds normal. No respiratory distress. He has no wheezes. He has no rales.  Abdominal: Soft. Bowel sounds are normal. He exhibits no distension. There is no tenderness. There is no rebound and no guarding. Hernia confirmed negative in the right inguinal area.  Genitourinary: Testes normal and penis normal. Cremasteric reflex is present. No penile tenderness.  Musculoskeletal:       Patient with TTP over the R inguinal area. Patient with antalgic gait but able to ambulate.  Neurological: He is alert and oriented to person, place,  and time. No cranial nerve deficit. He exhibits normal muscle tone. Coordination normal.  Skin: Skin is warm and dry. No rash noted.  Psychiatric: He has a normal mood and affect.    ED Course  Procedures (including critical care time)  Labs Reviewed - No data to display No results found.   1. Injury of groin       MDM  Patient had unremarkable exam aside from H and P consistent with groin strain.  He had no urinary or genital symptoms. Exam showed no  hernia or genital abnormalities.  There was no concern for torsion.  Patient was treated for his pain and was discharged home in good condition.No further testing was necessary based on the history and physical.        Cyndra Numbers, MD 05/06/11 2344

## 2011-05-24 ENCOUNTER — Emergency Department: Payer: Self-pay | Admitting: Emergency Medicine

## 2011-06-15 ENCOUNTER — Emergency Department (HOSPITAL_COMMUNITY)
Admission: EM | Admit: 2011-06-15 | Discharge: 2011-06-15 | Disposition: A | Discharge status: Home / Self Care | Payer: Self-pay | Attending: Emergency Medicine | Admitting: Emergency Medicine

## 2011-06-15 ENCOUNTER — Emergency Department (HOSPITAL_COMMUNITY): Payer: Self-pay

## 2011-06-15 ENCOUNTER — Encounter (HOSPITAL_COMMUNITY): Payer: Self-pay | Admitting: *Deleted

## 2011-06-15 DIAGNOSIS — M25473 Effusion, unspecified ankle: Secondary | ICD-10-CM | POA: Insufficient documentation

## 2011-06-15 DIAGNOSIS — M545 Low back pain, unspecified: Secondary | ICD-10-CM | POA: Insufficient documentation

## 2011-06-15 DIAGNOSIS — M25579 Pain in unspecified ankle and joints of unspecified foot: Secondary | ICD-10-CM | POA: Insufficient documentation

## 2011-06-15 DIAGNOSIS — W19XXXA Unspecified fall, initial encounter: Secondary | ICD-10-CM

## 2011-06-15 DIAGNOSIS — M79609 Pain in unspecified limb: Secondary | ICD-10-CM | POA: Insufficient documentation

## 2011-06-15 DIAGNOSIS — M542 Cervicalgia: Secondary | ICD-10-CM | POA: Insufficient documentation

## 2011-06-15 DIAGNOSIS — I10 Essential (primary) hypertension: Secondary | ICD-10-CM | POA: Insufficient documentation

## 2011-06-15 DIAGNOSIS — Z79899 Other long term (current) drug therapy: Secondary | ICD-10-CM | POA: Insufficient documentation

## 2011-06-15 DIAGNOSIS — S93409A Sprain of unspecified ligament of unspecified ankle, initial encounter: Secondary | ICD-10-CM | POA: Insufficient documentation

## 2011-06-15 DIAGNOSIS — Y93E1 Activity, personal bathing and showering: Secondary | ICD-10-CM | POA: Insufficient documentation

## 2011-06-15 DIAGNOSIS — W010XXA Fall on same level from slipping, tripping and stumbling without subsequent striking against object, initial encounter: Secondary | ICD-10-CM | POA: Insufficient documentation

## 2011-06-15 DIAGNOSIS — M25476 Effusion, unspecified foot: Secondary | ICD-10-CM | POA: Insufficient documentation

## 2011-06-15 MED ORDER — DIAZEPAM 5 MG PO TABS: 5.0000 mg | ORAL_TABLET | Freq: Four times a day (QID) | ORAL | Status: AC | PRN

## 2011-06-15 MED ORDER — SODIUM CHLORIDE 0.9 % IV SOLN
INTRAVENOUS | Status: DC
  Administered 2011-06-15: 02:00:00 via INTRAVENOUS

## 2011-06-15 MED ORDER — HYDROMORPHONE HCL PF 1 MG/ML IJ SOLN
1.0000 mg | Freq: Once | INTRAMUSCULAR | Status: AC
  Administered 2011-06-15: 1 mg via INTRAVENOUS
  Filled 2011-06-15: qty 1

## 2011-06-15 MED ORDER — OXYCODONE-ACETAMINOPHEN 5-325 MG PO TABS: 1.0000 | ORAL_TABLET | Freq: Four times a day (QID) | ORAL | Status: AC | PRN

## 2011-06-15 NOTE — ED Notes (Signed)
Pt to ED c/o slipping in shower 4 hours prior, twisting his L ankle, "twisting" his neck and landing flat on his back.  Denies loc.  Pt bruising to lat aspect of L ankle w/pain on any movement.  BIL grasp equal and strong, with increased pain to mid-back with extension.

## 2011-06-15 NOTE — ED Notes (Signed)
Beverage given to patient

## 2011-06-15 NOTE — ED Provider Notes (Signed)
History     CSN: [id number]  Arrival date & time 06/15/11  0036   First MD Initiated Contact with Patient 06/15/11 0105      Chief Complaint  Patient presents with  . Fall    (Consider location/radiation/quality/duration/timing/severity/associated sxs/prior treatment) The history is provided by the patient.   patient slipped and fell in the shower today. States his left ankle gave out on him. His pain in his left ankle lower back left foot and neck. States he did not hit his head but his head whipped around  now his neck hurts. No chest pain abdominal pain. Nausea vomiting diarrhea. No numbness or weakness. He ambulated here. No loss of conscious.  Past Medical History  Diagnosis Date  . Hypertension     History reviewed. No pertinent past surgical history.  History reviewed. No pertinent family history.  History  Substance Use Topics  . Smoking status: Current Everyday Smoker  . Smokeless tobacco: Not on file  . Alcohol Use: No      Review of Systems  Constitutional: Negative for activity change and appetite change.  HENT: Negative for neck stiffness.   Eyes: Negative for pain.  Respiratory: Negative for chest tightness and shortness of breath.   Cardiovascular: Negative for chest pain and leg swelling.  Gastrointestinal: Negative for nausea, vomiting, abdominal pain and diarrhea.  Genitourinary: Negative for flank pain.  Musculoskeletal: Positive for back pain and joint swelling.  Skin: Negative for rash.  Neurological: Negative for weakness, numbness and headaches.  Psychiatric/Behavioral: Negative for behavioral problems.    Allergies  Review of patient's allergies indicates no known allergies.  Home Medications   Current Outpatient Rx  Name Route Sig Dispense Refill  . ALBUTEROL SULFATE HFA 108 (90 BASE) MCG/ACT IN AERS Inhalation Inhale 2 puffs into the lungs every 6 (six) hours as needed. For shortness of breath.     . CLONAZEPAM 2 MG PO TABS Oral Take  2 mg by mouth 3 (three) times daily.      Marland Kitchen FLUTICASONE-SALMETEROL 500-50 MCG/DOSE IN AEPB Inhalation Inhale 2 puffs into the lungs every 12 (twelve) hours.      Marland Kitchen LISINOPRIL 10 MG PO TABS Oral Take 10 mg by mouth daily.      . OXYCODONE-ACETAMINOPHEN 5-325 MG PO TABS Oral Take 1-2 tablets by mouth every 6 (six) hours as needed for pain. 10 tablet 0    BP 127/76  Pulse 95  Temp(Src) 97.8 F (36.6 C) (Oral)  Resp 18  SpO2 99%  Physical Exam  Nursing note and vitals reviewed. Constitutional: He is oriented to person, place, and time. He appears well-developed and well-nourished.       Patient is morbidly obese  HENT:  Head: Normocephalic and atraumatic.  Eyes: EOM are normal. Pupils are equal, round, and reactive to light.  Neck:       Tender right paraspinal area  Cardiovascular: Normal rate, regular rhythm and normal heart sounds.   No murmur heard. Pulmonary/Chest: Effort normal and breath sounds normal.  Abdominal: Soft. Bowel sounds are normal. He exhibits no distension and no mass. There is no tenderness. There is no rebound and no guarding.  Musculoskeletal: He exhibits no edema.       Tender over lumbar area. Left ankle tenderness laterally. Left foot tenderness laterally. Neurovascularly intact  Neurological: He is alert and oriented to person, place, and time. No cranial nerve deficit.  Skin: Skin is warm and dry.  Psychiatric: He has a normal mood and affect.  ED Course  Procedures (including critical care time)  Labs Reviewed - No data to display Dg Lumbar Spine Complete  06/15/2011  *RADIOLOGY REPORT*  Clinical Data: Fall, lower back pain  LUMBAR SPINE - COMPLETE 4+ VIEW  Comparison: 03/01/2011 CT  Findings: Lumbarized S1.  Degenerative changes at L1-L2. Otherwise, the imaged vertebral bodies and inter-vertebral disc spaces are maintained. No displaced acute fracture or dislocation identified.   The para-vertebral and overlying soft tissues are within normal limits.   IMPRESSION: Degenerative changes. No acute osseous abnormality identified.  Original Report Authenticated By: Waneta Martins, M.D.   Dg Ankle Complete Left  06/15/2011  *RADIOLOGY REPORT*  Clinical Data: Left ankle pain status post fall.  LEFT ANKLE COMPLETE - 3+ VIEW  Comparison:  None.  Findings: Soft tissue swelling about the ankle, with fairly diffuse however most pronounced over the medial malleolus.  There is a small osseous density at the medial malleolus tip however appears fairly well corticated.  Degenerative changes of the midfoot. Enthesopathic changes of the calcaneus.  Talar dome intact.  No dislocation.  IMPRESSION: Diffuse soft tissue swelling.Calcific density at the tip of the medial malleolus may represent an avulsion fracture however appears well corticated suggesting a remote process.  Correlate with point tenderness.  Original Report Authenticated By: Waneta Martins, M.D.   Ct Cervical Spine Wo Contrast  06/15/2011  *RADIOLOGY REPORT*  Clinical Data: Status post fall, with right-sided neck pain.  CT CERVICAL SPINE WITHOUT CONTRAST  Technique:  Multidetector CT imaging of the cervical spine was performed. Multiplanar CT image reconstructions were also generated.  Comparison: 06/09/2009  Findings: Due to patient body habitus, images are significantly degraded, limiting evaluation for a nondisplaced fracture.  The craniocervical relationship is maintained.  The C1, C2, and C3 vertebral bodies are normal.  Cannot exclude a nondisplaced fracture involving C4-T1.  The alignment is maintained.  There is no prevertebral soft tissue swelling. Lung apices are degraded by motion however predominately clear.  IMPRESSION: Degraded by patient body habitus.  No displaced fracture or dislocation.  The mid to lower cervical spine is so degraded that a nondisplaced fracture cannot be excluded.  Original Report Authenticated By: Waneta Martins, M.D.   Dg Foot Complete Left  06/15/2011  *RADIOLOGY  REPORT*  Clinical Data: Dorsal foot pain status post fall.  LEFT FOOT - COMPLETE 3+ VIEW  Comparison: Contemporaneous ankle  Findings: Intact Lisfranc joint.  No acute fracture or dislocation identified.  Calcific density at the tip of the medial malleolus as described on contemporaneous ankle radiograph.  Loss of the plantar arch.  Midfoot DJD.  Subtalar joint DJD.  Plantar and posterior calcaneal enthesopathic changes.  IMPRESSION: Degenerative changes.  No definite acute fracture or dislocation of the left foot identified. If clinical concern for a fracture persists, recommend a repeat radiograph in 5-10 days to evaluate for interval change or callus formation.  Original Report Authenticated By: Waneta Martins, M.D.     1. Fall   2. Ankle sprain       MDM  Patient fell out of the shower. His neck pain left foot pain. X-ray only showed soft tissue swelling of the foot. Lumbar spine do not show a fracture. CT spine was indeterminant for lower injury. He is nontender midline. On reexamination he had improved range of motion. Clinically I doubt there is a fracture at the site.        Juliet Rude. Rubin Payor, MD 06/15/11 [phone]

## 2011-06-15 NOTE — ED Notes (Signed)
Pt reports falling getting out of the shower.  Reports neck pain, (L) ankle and foot pain.  Denies LOC.

## 2011-07-04 ENCOUNTER — Emergency Department (HOSPITAL_COMMUNITY)
Admission: EM | Admit: 2011-07-04 | Discharge: 2011-07-04 | Disposition: A | Discharge status: Home / Self Care | Payer: Self-pay | Attending: Emergency Medicine | Admitting: Emergency Medicine

## 2011-07-04 ENCOUNTER — Encounter (HOSPITAL_COMMUNITY): Payer: Self-pay | Admitting: Emergency Medicine

## 2011-07-04 ENCOUNTER — Emergency Department (HOSPITAL_COMMUNITY): Payer: Self-pay

## 2011-07-04 DIAGNOSIS — M436 Torticollis: Secondary | ICD-10-CM

## 2011-07-04 DIAGNOSIS — R51 Headache: Secondary | ICD-10-CM | POA: Insufficient documentation

## 2011-07-04 DIAGNOSIS — S161XXA Strain of muscle, fascia and tendon at neck level, initial encounter: Secondary | ICD-10-CM

## 2011-07-04 DIAGNOSIS — G44209 Tension-type headache, unspecified, not intractable: Secondary | ICD-10-CM

## 2011-07-04 DIAGNOSIS — M542 Cervicalgia: Secondary | ICD-10-CM | POA: Insufficient documentation

## 2011-07-04 DIAGNOSIS — F17200 Nicotine dependence, unspecified, uncomplicated: Secondary | ICD-10-CM | POA: Insufficient documentation

## 2011-07-04 DIAGNOSIS — Z79899 Other long term (current) drug therapy: Secondary | ICD-10-CM | POA: Insufficient documentation

## 2011-07-04 DIAGNOSIS — E669 Obesity, unspecified: Secondary | ICD-10-CM | POA: Insufficient documentation

## 2011-07-04 DIAGNOSIS — M62838 Other muscle spasm: Secondary | ICD-10-CM | POA: Insufficient documentation

## 2011-07-04 DIAGNOSIS — I10 Essential (primary) hypertension: Secondary | ICD-10-CM | POA: Insufficient documentation

## 2011-07-04 HISTORY — DX: Obesity, unspecified: E66.9

## 2011-07-04 MED ORDER — DIPHENHYDRAMINE HCL 50 MG/ML IJ SOLN
25.0000 mg | Freq: Once | INTRAMUSCULAR | Status: AC
  Administered 2011-07-04: 50 mg via INTRAVENOUS
  Filled 2011-07-04: qty 1

## 2011-07-04 MED ORDER — HYDROMORPHONE HCL PF 1 MG/ML IJ SOLN
1.0000 mg | Freq: Once | INTRAMUSCULAR | Status: AC
  Administered 2011-07-04: 1 mg via INTRAVENOUS
  Filled 2011-07-04: qty 1

## 2011-07-04 MED ORDER — IBUPROFEN 800 MG PO TABS: 800.0000 mg | ORAL_TABLET | Freq: Three times a day (TID) | ORAL | Status: AC | PRN

## 2011-07-04 MED ORDER — SODIUM CHLORIDE 0.9 % IV SOLN
INTRAVENOUS | Status: DC
  Administered 2011-07-04: 05:00:00 via INTRAVENOUS

## 2011-07-04 MED ORDER — DIAZEPAM 5 MG PO TABS
5.0000 mg | ORAL_TABLET | Freq: Once | ORAL | Status: AC
  Administered 2011-07-04: 5 mg via ORAL
  Filled 2011-07-04: qty 1

## 2011-07-04 MED ORDER — DIAZEPAM 10 MG PO TABS: 10.0000 mg | ORAL_TABLET | Freq: Three times a day (TID) | ORAL | Status: AC | PRN

## 2011-07-04 MED ORDER — METHYLPREDNISOLONE SODIUM SUCC 125 MG IJ SOLR
125.0000 mg | Freq: Once | INTRAMUSCULAR | Status: AC
  Administered 2011-07-04: 125 mg via INTRAVENOUS
  Filled 2011-07-04: qty 2

## 2011-07-04 MED ORDER — PROMETHAZINE HCL 25 MG/ML IJ SOLN
25.0000 mg | INTRAMUSCULAR | Status: AC
  Administered 2011-07-04: 25 mg via INTRAVENOUS
  Filled 2011-07-04: qty 1

## 2011-07-04 MED ORDER — SODIUM CHLORIDE 0.9 % IV BOLUS (SEPSIS)
1000.0000 mL | Freq: Once | INTRAVENOUS | Status: AC
  Administered 2011-07-04: 1000 mL via INTRAVENOUS

## 2011-07-04 MED ORDER — OXYCODONE-ACETAMINOPHEN 5-325 MG PO TABS: 2.0000 | ORAL_TABLET | ORAL | Status: AC | PRN

## 2011-07-04 NOTE — ED Provider Notes (Addendum)
History     CSN: [id number]  Arrival date & time 07/04/11  0226   First MD Initiated Contact with Patient 07/04/11 0243      Chief Complaint  Patient presents with  . Headache    (Consider location/radiation/quality/duration/timing/severity/associated sxs/prior treatment) HPI Comments: The patient is a 41 year old male who presents for evaluation of headache and neck pain that he has had now for 2 weeks after having a slip and fall accident in his shower where he fell backwards and he said "whipped his head backwards" with subsequent neck pain and headache. He was evaluated in the emergency department 2 weeks ago at that time with CT scan of the neck as well as x-rays of other areas of his body that were painful, and there were no acute abnormalities found, however his lower cervical spine was incompletely imaged due to his large body habitus. He returns now reporting that the headache and neck pain have persisted and possibly worsened over the 2 weeks since his prior visit, and now severe, 10 out of 10 in intensity, dull, aching, throbbing, located at the right for head and the right side of the neck. The headache and neck pain or nonradiating. He denies any numbness or weakness in the lower extremities or upper extremities. He does report some blurred vision intermittently, but denies tinnitus. He reports nausea but denies vomiting. He denies fever, chills, rash, or other symptoms. He denies other back pain. The pain is made worse by light and sound, as well as by rotational movement or flexion of the neck. The pain is not improved by ibuprofen.  Patient is a 41 y.o. male presenting with headaches. The history is provided by the patient and medical records.  Headache  Pertinent negatives include no fever, no palpitations, no shortness of breath, no nausea and no vomiting.    Past Medical History  Diagnosis Date  . Hypertension   . Obesity     History reviewed. No pertinent past surgical  history.  No family history on file.  History  Substance Use Topics  . Smoking status: Current Everyday Smoker  . Smokeless tobacco: Not on file  . Alcohol Use: No      Review of Systems  Constitutional: Negative for fever, chills, diaphoresis, activity change, appetite change, fatigue and unexpected weight change.  HENT: Positive for neck pain and neck stiffness. Negative for hearing loss, ear pain, nosebleeds, congestion, sore throat, facial swelling, rhinorrhea, sneezing, mouth sores, trouble swallowing, dental problem, postnasal drip, sinus pressure, tinnitus and ear discharge.   Eyes: Positive for photophobia and visual disturbance. Negative for pain, discharge, redness and itching.  Respiratory: Negative for cough, chest tightness and shortness of breath.   Cardiovascular: Negative for chest pain and palpitations.  Gastrointestinal: Negative for nausea, vomiting, abdominal pain and diarrhea.  Genitourinary: Negative.   Musculoskeletal: Negative for myalgias and back pain.  Skin: Negative for color change, pallor, rash and wound.  Neurological: Positive for headaches. Negative for dizziness, tremors, seizures, syncope, facial asymmetry, speech difficulty, weakness, light-headedness and numbness.  Hematological: Negative for adenopathy. Does not bruise/bleed easily.  Psychiatric/Behavioral: Negative.     Allergies  Review of patient's allergies indicates no known allergies.  Home Medications   Current Outpatient Rx  Name Route Sig Dispense Refill  . ALBUTEROL SULFATE HFA 108 (90 BASE) MCG/ACT IN AERS Inhalation Inhale 2 puffs into the lungs every 6 (six) hours as needed. For shortness of breath.     . CLONAZEPAM 2 MG PO TABS Oral  Take 2 mg by mouth 3 (three) times daily.      Marland Kitchen FLUTICASONE-SALMETEROL 500-50 MCG/DOSE IN AEPB Inhalation Inhale 2 puffs into the lungs every 12 (twelve) hours.      Marland Kitchen LISINOPRIL 10 MG PO TABS Oral Take 10 mg by mouth daily.        BP 143/104   Pulse 126  Temp(Src) 97.9 F (36.6 C) (Oral)  Resp 20  SpO2 97%  Physical Exam  Nursing note and vitals reviewed. Constitutional: He is oriented to person, place, and time. He appears well-developed and well-nourished. He appears distressed.       Morbidly obese  HENT:  Head: Normocephalic and atraumatic. Head is without raccoon's eyes, without Battle's sign, without abrasion, without contusion, without right periorbital erythema and without left periorbital erythema.  Right Ear: Hearing, tympanic membrane, external ear and ear canal normal. No drainage, swelling or tenderness. No mastoid tenderness. No middle ear effusion. No hemotympanum. No decreased hearing is noted.  Left Ear: Hearing, tympanic membrane, external ear and ear canal normal. No drainage, swelling or tenderness. No mastoid tenderness.  No middle ear effusion. No hemotympanum. No decreased hearing is noted.  Nose: Nose normal. No mucosal edema, rhinorrhea, sinus tenderness, nasal deformity, septal deviation or nasal septal hematoma. No epistaxis. Right sinus exhibits no maxillary sinus tenderness and no frontal sinus tenderness. Left sinus exhibits no maxillary sinus tenderness and no frontal sinus tenderness.  Mouth/Throat: Uvula is midline, oropharynx is clear and moist and mucous membranes are normal. No oropharyngeal exudate, posterior oropharyngeal edema, posterior oropharyngeal erythema or tonsillar abscesses.  Eyes: Conjunctivae, EOM and lids are normal. Pupils are equal, round, and reactive to light. Right eye exhibits no chemosis, no discharge, no exudate and no hordeolum. No foreign body present in the right eye. Left eye exhibits no chemosis, no discharge, no exudate and no hordeolum. No foreign body present in the left eye. Right conjunctiva is not injected. Right conjunctiva has no hemorrhage. Left conjunctiva is not injected. Left conjunctiva has no hemorrhage. Right eye exhibits normal extraocular motion and no  nystagmus. Left eye exhibits normal extraocular motion and no nystagmus.  Fundoscopic exam:      The right eye shows no AV nicking, no exudate, no hemorrhage and no papilledema.       The left eye shows no AV nicking, no exudate, no hemorrhage and no papilledema.  Neck: Trachea normal, normal range of motion, full passive range of motion without pain and phonation normal. Neck supple. No JVD present. No tracheal tenderness, no spinous process tenderness and no muscular tenderness present. Carotid bruit is not present. No rigidity. No tracheal deviation, no edema, no erythema and normal range of motion present. No Brudzinski's sign noted. No mass and no thyromegaly present.  Cardiovascular: Normal rate, regular rhythm, normal heart sounds and intact distal pulses.  Exam reveals no gallop and no friction rub.   No murmur heard. Pulmonary/Chest: Effort normal and breath sounds normal. No stridor. No respiratory distress. He has no wheezes. He has no rales.  Abdominal: Soft. Bowel sounds are normal. He exhibits no distension and no mass. There is no tenderness. There is no rebound and no guarding.  Musculoskeletal: He exhibits no edema and no tenderness.       Cervical back: He exhibits decreased range of motion, tenderness, pain and spasm. He exhibits no bony tenderness, no swelling, no edema and no deformity.       The patient has right-sided paraspinal cervical muscular tenderness to palpation  with palpable muscle spasm here tracking from the superior trapezius up the right para spinal cervical musculature to the occiput of the head, suggestive of cervical strain and spasm.  Neurological: He is alert and oriented to person, place, and time. He has normal reflexes. He displays no atrophy, no tremor and normal reflexes. No cranial nerve deficit or sensory deficit. He exhibits normal muscle tone. He displays a negative Romberg sign. He displays no seizure activity. Coordination and gait normal. GCS eye  subscore is 4. GCS verbal subscore is 5. GCS motor subscore is 6.  Reflex Scores:      Tricep reflexes are 2+ on the right side and 2+ on the left side.      Bicep reflexes are 2+ on the right side and 2+ on the left side.      Brachioradialis reflexes are 2+ on the right side and 2+ on the left side.      Patellar reflexes are 2+ on the right side and 2+ on the left side.      Achilles reflexes are 2+ on the right side and 2+ on the left side.      Normal visual fields to confrontation, normal coordination left finger-nose-finger and heel-to-shin test, normal gait, no cerebellar signs, nonfocal neurologic exam.  Skin: Skin is warm and dry. No rash noted. He is not diaphoretic. No erythema. No pallor.  Psychiatric: He has a normal mood and affect. His behavior is normal. Judgment and thought content normal.    ED Course  Procedures (including critical care time)  Labs Reviewed - No data to display No results found.   No diagnosis found.  3:42 AM The patient's headache and neck pain that has improved from 10 out of 10 in intensity, to no 6 or 7/10 in intensity. He is still awake, alert, and oriented appropriately and safe for re\re dosing of Dilaudid and diazepam to try and achieve greater relief of his symptoms. I have ordered these medications at this time.  4:28 AM The patient is at this time very sleepy, but easily arousable to voice. His pain is now down to a 3-4/10 in intensity, "the best that I felt all week". I have reviewed his CT scans which show no acute intracranial trauma, and show a completely visualized cervical spine without any evident fractures or subluxations. The patient's symptoms fit with that of muscular tension headache and cervical muscle spasm from his fall 2 weeks ago with likely whiplash type injury. I will await sobriety on the patient's behalf (from muscle relaxants, anti-emetics, and analgesics) and discharge him home with medications for pain relief and muscle  spasm. The patient states his understanding of and agreement with this plan of care.  6:38 AM The patient is now awake, alert, and oriented to person, place, time, and event. He is appropriate for discharge home. The patient states his agreement with this plan. MDM  I most strongly suspect that the patient is having a severe muscular tension headache with torticollis, or cervical spasm from muscular strain at the time of his fall, exacerbated by body posture and body habitus. Other considerations include intracranial hemorrhage and closed head injury which are less likely based on the physical examination but could be compatible with his symptoms, and for which I will obtain a CT scan of his brain to evaluate further. He does also have a history of migraine headaches, and this could be a migraine headache. Alternately, there may have been a nondisplaced fracture of the  cervical spine that was not seen at the time of imaging previously, and I will repeat the CT scan of the neck to evaluate further for this. I do not suspect meningitis, as the patient has had no infectious symptoms like fever or rash, and his symptoms aligned historically with his fall. I'm treating his headache both from a migraine and from a muscular tension headache standpoint, with anti-emetics, anti-histamine, steroid, narcotic analgesic, and muscle relaxant.        Felisa Bonier, MD 07/04/11 1191  Felisa Bonier, MD 07/04/11 4782  Felisa Bonier, MD 07/04/11 [id number]  Felisa Bonier, MD 07/04/11 [phone]

## 2011-07-04 NOTE — ED Notes (Signed)
Report fall x2 wks ago - seen in ED at that time - had neg workup - d/c'd home; returns with c/o continued h/a and neck "stiffness"; also endorses n/v, "blurry vision" and feeling "off balance"; states has taken Motrin repeatedly without pain relief

## 2011-07-04 NOTE — ED Notes (Signed)
PT. REPORTS HEADACHE WITH NECK PAIN AND STIFFNESS FOR 2 WEEKS , STATES HE FELL 2 WEEKS AGO.

## 2011-07-09 ENCOUNTER — Emergency Department (HOSPITAL_COMMUNITY)
Admission: EM | Admit: 2011-07-09 | Discharge: 2011-07-09 | Disposition: A | Discharge status: Home / Self Care | Payer: Self-pay | Attending: Emergency Medicine | Admitting: Emergency Medicine

## 2011-07-09 ENCOUNTER — Encounter (HOSPITAL_COMMUNITY): Payer: Self-pay | Admitting: *Deleted

## 2011-07-09 ENCOUNTER — Emergency Department (HOSPITAL_COMMUNITY): Payer: Self-pay

## 2011-07-09 DIAGNOSIS — E669 Obesity, unspecified: Secondary | ICD-10-CM | POA: Insufficient documentation

## 2011-07-09 DIAGNOSIS — I10 Essential (primary) hypertension: Secondary | ICD-10-CM | POA: Insufficient documentation

## 2011-07-09 DIAGNOSIS — H53149 Visual discomfort, unspecified: Secondary | ICD-10-CM | POA: Insufficient documentation

## 2011-07-09 DIAGNOSIS — R110 Nausea: Secondary | ICD-10-CM | POA: Insufficient documentation

## 2011-07-09 DIAGNOSIS — R51 Headache: Secondary | ICD-10-CM | POA: Insufficient documentation

## 2011-07-09 DIAGNOSIS — W010XXA Fall on same level from slipping, tripping and stumbling without subsequent striking against object, initial encounter: Secondary | ICD-10-CM | POA: Insufficient documentation

## 2011-07-09 DIAGNOSIS — M542 Cervicalgia: Secondary | ICD-10-CM | POA: Insufficient documentation

## 2011-07-09 MED ORDER — VALPROATE SODIUM 500 MG/5ML IV SOLN
500.0000 mg | Freq: Once | INTRAVENOUS | Status: AC
  Administered 2011-07-09: 500 mg via INTRAVENOUS
  Filled 2011-07-09: qty 5

## 2011-07-09 MED ORDER — HYDROCODONE-ACETAMINOPHEN 5-325 MG PO TABS: 2.0000 | ORAL_TABLET | ORAL | Status: DC | PRN

## 2011-07-09 MED ORDER — DIAZEPAM 5 MG PO TABS: 10.0000 mg | ORAL_TABLET | Freq: Two times a day (BID) | ORAL | Status: DC

## 2011-07-09 MED ORDER — DIAZEPAM 5 MG/ML IJ SOLN
10.0000 mg | Freq: Once | INTRAMUSCULAR | Status: AC
  Administered 2011-07-09: 10 mg via INTRAVENOUS
  Filled 2011-07-09: qty 2

## 2011-07-09 MED ORDER — DIPHENHYDRAMINE HCL 50 MG/ML IJ SOLN
25.0000 mg | Freq: Once | INTRAMUSCULAR | Status: AC
  Administered 2011-07-09: 50 mg via INTRAMUSCULAR
  Filled 2011-07-09: qty 1

## 2011-07-09 MED ORDER — KETOROLAC TROMETHAMINE 60 MG/2ML IM SOLN
60.0000 mg | Freq: Once | INTRAMUSCULAR | Status: AC
  Administered 2011-07-09: 60 mg via INTRAMUSCULAR
  Filled 2011-07-09: qty 2

## 2011-07-09 MED ORDER — TETRACAINE HCL 0.5 % OP SOLN
2.0000 [drp] | Freq: Once | OPHTHALMIC | Status: DC
  Filled 2011-07-09 (×2): qty 2

## 2011-07-09 MED ORDER — HYDROMORPHONE HCL PF 2 MG/ML IJ SOLN
2.0000 mg | Freq: Once | INTRAMUSCULAR | Status: AC
  Administered 2011-07-09: 2 mg via INTRAMUSCULAR
  Filled 2011-07-09: qty 1

## 2011-07-09 MED ORDER — HYDROCODONE-ACETAMINOPHEN 5-325 MG PO TABS
2.0000 | ORAL_TABLET | Freq: Once | ORAL | Status: AC
  Administered 2011-07-09: 2 via ORAL
  Filled 2011-07-09: qty 2

## 2011-07-09 MED ORDER — DROPERIDOL 2.5 MG/ML IJ SOLN
1.2500 mg | Freq: Once | INTRAMUSCULAR | Status: AC
  Administered 2011-07-09: 5 mg via INTRAMUSCULAR
  Filled 2011-07-09 (×2): qty 0.5

## 2011-07-09 MED ORDER — DEXAMETHASONE SODIUM PHOSPHATE 10 MG/ML IJ SOLN
10.0000 mg | Freq: Once | INTRAMUSCULAR | Status: AC
  Administered 2011-07-09: 10 mg via INTRAVENOUS
  Filled 2011-07-09: qty 1

## 2011-07-09 MED ORDER — DIAZEPAM 5 MG PO TABS: 5.0000 mg | ORAL_TABLET | Freq: Two times a day (BID) | ORAL | Status: DC

## 2011-07-09 MED ORDER — VALPROATE SODIUM 500 MG/5ML IV SOLN
500.0000 mg | Freq: Once | INTRAVENOUS | Status: DC
  Filled 2011-07-09: qty 5

## 2011-07-09 MED ORDER — SODIUM CHLORIDE 0.9 % IV BOLUS (SEPSIS)
1000.0000 mL | Freq: Once | INTRAVENOUS | Status: AC
  Administered 2011-07-09: 1000 mL via INTRAVENOUS

## 2011-07-09 NOTE — ED Provider Notes (Signed)
Assumed care of patient in the CDU.  Patient signed out by Dr. Manus Gunning, MD.  Patient has had an intractable headache since he slipped and fell in the bathtub on 06/15/11.  Patient with negative CT head and neck on 07/04/11.  Patient given pain medications prior to transfer to CDU.  Plan is for patient to be discharged home if feeling better.  8:00 PM Dr. Lovett Calender Neurology reviewed case.  She feels that patient's symptoms are most likely post concussive syndrome.  She recommends putting the patient on Valium BID and giving him Norco for break through pain and having him follow up with Neurology outpatient.   9:17 PM Reassessed patient.  He reports that his pain is decreasing at this time.  Patient alert and orientated x 3.  No acute distress.  Normal gait.  Muscle strength 5/5 bilaterally.  Will discharge patient home and have him follow up with Neurology outpatient.  Patient in agreement with the plan.    Pascal Lux [place], PA-C 07/09/11 2358

## 2011-07-09 NOTE — Discharge Instructions (Signed)
Take valium twice a day.  Take Norco for breakthrough pain.  Both of these medications can make you tired.  Do not drive or operate heavy machinery for 4 hours after taking medication. Follow up with Dr. Melven Sartorius Neurology.  Call and make an appointment.

## 2011-07-09 NOTE — ED Notes (Signed)
Patient transported from MRI.  Unable to complete due to pt size.

## 2011-07-09 NOTE — ED Provider Notes (Signed)
History     CSN: [id number]  Arrival date & time 07/09/11  1508   First MD Initiated Contact with Patient 07/09/11 1633      Chief Complaint  Patient presents with  . Headache    (Consider location/radiation/quality/duration/timing/severity/associated sxs/prior treatment) HPI Comments: Patient presenting with dull right-sided headache that he has had constantly since slipping and falling in the shower and January 6. He was seen here in January 25 with a similar headache, had negative CT head and neck imaging.  Headaches are moderate to severe it radiates into the right side of his head down his neck along the trapezius. There is no weakness, numbness, tingling, or bladder incontinence. There is no fever, chest pain or shortness of breath. Patient reports no history of headache prior to his slip and fall. He's been taking Percocet and Motrin with some relief though he says its very short relief. He has had nausea and photophobia but no vomiting. Pain is worsened by light and noise with movement of his head.  The history is provided by the patient.    Past Medical History  Diagnosis Date  . Hypertension   . Obesity     History reviewed. No pertinent past surgical history.  History reviewed. No pertinent family history.  History  Substance Use Topics  . Smoking status: Current Everyday Smoker  . Smokeless tobacco: Not on file  . Alcohol Use: No      Review of Systems  Constitutional: Positive for activity change. Negative for fatigue.  HENT: Positive for neck pain and neck stiffness.   Eyes: Negative for pain and visual disturbance.  Cardiovascular: Negative for chest pain.  Gastrointestinal: Positive for nausea and vomiting. Negative for abdominal pain.  Genitourinary: Negative for dysuria and hematuria.  Musculoskeletal: Negative for back pain.  Skin: Negative for rash.  Neurological: Positive for headaches. Negative for weakness.    Allergies  Review of patient's  allergies indicates no known allergies.  Home Medications   Current Outpatient Rx  Name Route Sig Dispense Refill  . ALBUTEROL SULFATE HFA 108 (90 BASE) MCG/ACT IN AERS Inhalation Inhale 2 puffs into the lungs every 6 (six) hours as needed. For shortness of breath.     . CLONAZEPAM 2 MG PO TABS Oral Take 2 mg by mouth 3 (three) times daily.      Marland Kitchen DIAZEPAM 10 MG PO TABS Oral Take 1 tablet (10 mg total) by mouth every 8 (eight) hours as needed (muscle spasm). 12 tablet 0  . FLUTICASONE-SALMETEROL 500-50 MCG/DOSE IN AEPB Inhalation Inhale 2 puffs into the lungs every 12 (twelve) hours.      . IBUPROFEN 800 MG PO TABS Oral Take 1 tablet (800 mg total) by mouth every 8 (eight) hours as needed for pain. 21 tablet 0  . LISINOPRIL 10 MG PO TABS Oral Take 10 mg by mouth daily.      . OXYCODONE-ACETAMINOPHEN 5-325 MG PO TABS Oral Take 2 tablets by mouth every 4 (four) hours as needed for pain. 24 tablet 0  . DIAZEPAM 5 MG PO TABS Oral Take 2 tablets (10 mg total) by mouth 2 (two) times daily. 15 tablet 0  . HYDROCODONE-ACETAMINOPHEN 5-325 MG PO TABS Oral Take 2 tablets by mouth every 4 (four) hours as needed for pain. 15 tablet 0    BP 119/78  Pulse 90  Temp(Src) 98.2 F (36.8 C) (Oral)  Resp 24  SpO2 93%  Physical Exam  Constitutional: He is oriented to person, place, and time.  He appears well-developed and well-nourished. No distress.  HENT:  Head: Normocephalic and atraumatic.  Mouth/Throat: Oropharynx is clear and moist. No oropharyngeal exudate.  Eyes: Conjunctivae and EOM are normal. Pupils are equal, round, and reactive to light.       IOP OD 15, OS 12  Neck: Normal range of motion.       No meningismus. Right paraspinal cervical muscle spasm No midline pain  Cardiovascular: Normal rate, regular rhythm and normal heart sounds.   Pulmonary/Chest: Effort normal and breath sounds normal. No respiratory distress.  Abdominal: Soft. There is no tenderness. There is no rebound and no  guarding.  Musculoskeletal: Normal range of motion. He exhibits no edema and no tenderness.  Neurological: He is alert and oriented to person, place, and time. No cranial nerve deficit.       5 out of 5 strength throughout, no facial asymmetry, no ataxia finger to nose  Skin: Skin is warm.    ED Course  Procedures (including critical care time)  Labs Reviewed - No data to display No results found.   1. Headache       MDM  Persistent headaches and slip and fall 2 weeks ago. He has had negative imaging of the head and C-spine. Neuro exam is nonfocal.  No fever, meningismus, focal deficit.  Intractable headache despite mulitple doses of meds.  Patient exceeds weight limit for MRI. History physical exam not consistent with the subarachnoid hemorrhage or meningitis. Will move to CDU and discuss with neuro.  D/w Dr.Caesar who reviewed case.  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 She recommends treatment with Valium twice a day for spasm and anxiety with Norco for breakthrough pain.     Glynn Octave, MD 07/09/11 2153

## 2011-07-09 NOTE — ED Notes (Signed)
Patient transported to MRI 

## 2011-07-09 NOTE — ED Notes (Signed)
Reports hx of falling and injuring his neck, has been getting severe headaches since

## 2011-07-10 NOTE — ED Provider Notes (Signed)
Medical screening examination/treatment/procedure(s) were conducted as a shared visit with non-physician practitioner(s) and myself.  I personally evaluated the patient during the encounter  Glynn Octave, MD 07/10/11 1213

## 2011-07-19 ENCOUNTER — Emergency Department (HOSPITAL_COMMUNITY)
Admission: EM | Admit: 2011-07-19 | Discharge: 2011-07-20 | Disposition: A | Discharge status: Home / Self Care | Payer: Self-pay | Attending: Emergency Medicine | Admitting: Emergency Medicine

## 2011-07-19 ENCOUNTER — Encounter (HOSPITAL_COMMUNITY): Payer: Self-pay

## 2011-07-19 DIAGNOSIS — F3289 Other specified depressive episodes: Secondary | ICD-10-CM | POA: Insufficient documentation

## 2011-07-19 DIAGNOSIS — M129 Arthropathy, unspecified: Secondary | ICD-10-CM | POA: Insufficient documentation

## 2011-07-19 DIAGNOSIS — G43509 Persistent migraine aura without cerebral infarction, not intractable, without status migrainosus: Secondary | ICD-10-CM | POA: Insufficient documentation

## 2011-07-19 DIAGNOSIS — R000 Tachycardia, unspecified: Secondary | ICD-10-CM | POA: Insufficient documentation

## 2011-07-19 DIAGNOSIS — F17200 Nicotine dependence, unspecified, uncomplicated: Secondary | ICD-10-CM | POA: Insufficient documentation

## 2011-07-19 DIAGNOSIS — I10 Essential (primary) hypertension: Secondary | ICD-10-CM | POA: Insufficient documentation

## 2011-07-19 DIAGNOSIS — F329 Major depressive disorder, single episode, unspecified: Secondary | ICD-10-CM | POA: Insufficient documentation

## 2011-07-19 DIAGNOSIS — Z79899 Other long term (current) drug therapy: Secondary | ICD-10-CM | POA: Insufficient documentation

## 2011-07-19 DIAGNOSIS — R51 Headache: Secondary | ICD-10-CM | POA: Insufficient documentation

## 2011-07-19 HISTORY — DX: Unspecified osteoarthritis, unspecified site: M19.90

## 2011-07-19 HISTORY — DX: Persistent migraine aura without cerebral infarction, not intractable, without status migrainosus: G43.509

## 2011-07-19 HISTORY — DX: Headache: R51

## 2011-07-19 HISTORY — DX: Disorder of kidney and ureter, unspecified: N28.9

## 2011-07-19 HISTORY — DX: Headache, unspecified: R51.9

## 2011-07-19 HISTORY — DX: Depression, unspecified: F32.A

## 2011-07-19 HISTORY — DX: Major depressive disorder, single episode, unspecified: F32.9

## 2011-07-19 MED ORDER — LORAZEPAM 2 MG/ML IJ SOLN
1.0000 mg | Freq: Once | INTRAMUSCULAR | Status: AC
  Administered 2011-07-19: 1 mg via INTRAVENOUS
  Filled 2011-07-19: qty 1

## 2011-07-19 MED ORDER — HYDROMORPHONE HCL PF 1 MG/ML IJ SOLN
1.0000 mg | Freq: Once | INTRAMUSCULAR | Status: AC
  Administered 2011-07-19: 1 mg via INTRAVENOUS
  Filled 2011-07-19: qty 1

## 2011-07-19 MED ORDER — ONDANSETRON HCL 4 MG/2ML IJ SOLN
4.0000 mg | Freq: Once | INTRAMUSCULAR | Status: AC
  Administered 2011-07-19: 4 mg via INTRAVENOUS
  Filled 2011-07-19: qty 2

## 2011-07-19 NOTE — ED Provider Notes (Signed)
History     CSN: [id number]  Arrival date & time 07/19/11  2135   First MD Initiated Contact with Patient 07/19/11 2306      Chief Complaint  Patient presents with  . Headache    (Consider location/radiation/quality/duration/timing/severity/associated sxs/prior treatment) Patient is a 41 y.o. male presenting with headaches. The history is provided by the patient.  Headache    patient here complaining of headache x1 month after a head injury. Has been taking Vicodin and Valium with limited relief of his symptoms. He has an appointment to see the neurologist in 2 days. Headache is similar to what it had before in the past described as dull frontal in nature. Some emesis no fever or neck pain. No focal neurological deficits. Nothing makes the symptoms worse. Pain medication makes better  Past Medical History  Diagnosis Date  . Hypertension   . Obesity   . Arthritis   . Renal disorder   . Depression   . Migraine aura, persistent     since childhood  . Head ache     Past Surgical History  Procedure Date  . Cardiac catheterization     x2  . Appendectomy     History reviewed. No pertinent family history.  History  Substance Use Topics  . Smoking status: Current Everyday Smoker  . Smokeless tobacco: Not on file  . Alcohol Use: No      Review of Systems  Neurological: Positive for headaches.  All other systems reviewed and are negative.    Allergies  Review of patient's allergies indicates no known allergies.  Home Medications   Current Outpatient Rx  Name Route Sig Dispense Refill  . ALBUTEROL SULFATE HFA 108 (90 BASE) MCG/ACT IN AERS Inhalation Inhale 2 puffs into the lungs every 6 (six) hours as needed. For shortness of breath.     . CLONAZEPAM 2 MG PO TABS Oral Take 2 mg by mouth 3 (three) times daily.      Marland Kitchen DIAZEPAM 5 MG PO TABS Oral Take 2 tablets (10 mg total) by mouth 2 (two) times daily. 15 tablet 0  . FLUTICASONE-SALMETEROL 500-50 MCG/DOSE IN AEPB  Inhalation Inhale 2 puffs into the lungs every 12 (twelve) hours.      Marland Kitchen HYDROCODONE-ACETAMINOPHEN 5-325 MG PO TABS Oral Take 2 tablets by mouth every 4 (four) hours as needed. For pain    . LISINOPRIL 10 MG PO TABS Oral Take 10 mg by mouth daily.        BP 135/89  Pulse 102  Temp(Src) 98.7 F (37.1 C) (Oral)  Resp 24  SpO2 97%  Physical Exam  Nursing note and vitals reviewed. Constitutional: He is oriented to person, place, and time. He appears well-developed and well-nourished.  Non-toxic appearance. No distress.  HENT:  Head: Normocephalic and atraumatic.  Eyes: Conjunctivae, EOM and lids are normal. Pupils are equal, round, and reactive to light.  Neck: Normal range of motion. Neck supple. No tracheal deviation present. No mass present.  Cardiovascular: Regular rhythm and normal heart sounds.  Tachycardia present.  Exam reveals no gallop.   No murmur heard. Pulmonary/Chest: Effort normal and breath sounds normal. No stridor. No respiratory distress. He has no decreased breath sounds. He has no wheezes. He has no rhonchi. He has no rales.  Abdominal: Soft. Normal appearance and bowel sounds are normal. He exhibits no distension. There is no tenderness. There is no rebound and no CVA tenderness.  Musculoskeletal: Normal range of motion. He exhibits no edema and  no tenderness.  Neurological: He is alert and oriented to person, place, and time. He has normal strength. No cranial nerve deficit or sensory deficit. GCS eye subscore is 4. GCS verbal subscore is 5. GCS motor subscore is 6.  Skin: Skin is warm and dry. No abrasion and no rash noted.  Psychiatric: He has a normal mood and affect. His speech is normal and behavior is normal.    ED Course  Procedures (including critical care time)  Labs Reviewed - No data to display No results found.   No diagnosis found.    MDM  Patient given pain medication for his headache. Neurological exam is stable and was repeated. He'll be  given 2 days worth of Vicodin and Valium and was instructed to keep his appointment with his neurologist on monday        Toy Baker, MD 07/20/11 [phone]

## 2011-07-19 NOTE — ED Notes (Signed)
Pt states that he is out of his vicodin and valium and needs pain control. Has an appt w/ a neurologist on Monday

## 2011-07-20 MED ORDER — DIAZEPAM 5 MG PO TABS: 10.0000 mg | ORAL_TABLET | Freq: Two times a day (BID) | ORAL | Status: AC

## 2011-07-20 MED ORDER — DIAZEPAM 5 MG PO TABS: 10.0000 mg | ORAL_TABLET | Freq: Two times a day (BID) | ORAL | Status: DC

## 2011-07-20 MED ORDER — HYDROCODONE-ACETAMINOPHEN 5-325 MG PO TABS: 2.0000 | ORAL_TABLET | ORAL | Status: AC | PRN

## 2011-07-20 MED ORDER — HYDROMORPHONE HCL PF 1 MG/ML IJ SOLN
1.0000 mg | Freq: Once | INTRAMUSCULAR | Status: AC
  Administered 2011-07-20: 1 mg via INTRAVENOUS
  Filled 2011-07-20: qty 1

## 2011-07-25 ENCOUNTER — Encounter (HOSPITAL_COMMUNITY): Payer: Self-pay | Admitting: Emergency Medicine

## 2011-07-25 ENCOUNTER — Emergency Department (HOSPITAL_COMMUNITY)
Admission: EM | Admit: 2011-07-25 | Discharge: 2011-07-25 | Disposition: A | Discharge status: Home / Self Care | Payer: Self-pay | Attending: Emergency Medicine | Admitting: Emergency Medicine

## 2011-07-25 DIAGNOSIS — F3289 Other specified depressive episodes: Secondary | ICD-10-CM | POA: Insufficient documentation

## 2011-07-25 DIAGNOSIS — Z79899 Other long term (current) drug therapy: Secondary | ICD-10-CM | POA: Insufficient documentation

## 2011-07-25 DIAGNOSIS — M549 Dorsalgia, unspecified: Secondary | ICD-10-CM | POA: Insufficient documentation

## 2011-07-25 DIAGNOSIS — M129 Arthropathy, unspecified: Secondary | ICD-10-CM | POA: Insufficient documentation

## 2011-07-25 DIAGNOSIS — R109 Unspecified abdominal pain: Secondary | ICD-10-CM | POA: Insufficient documentation

## 2011-07-25 DIAGNOSIS — F329 Major depressive disorder, single episode, unspecified: Secondary | ICD-10-CM | POA: Insufficient documentation

## 2011-07-25 DIAGNOSIS — R300 Dysuria: Secondary | ICD-10-CM | POA: Insufficient documentation

## 2011-07-25 DIAGNOSIS — R209 Unspecified disturbances of skin sensation: Secondary | ICD-10-CM | POA: Insufficient documentation

## 2011-07-25 DIAGNOSIS — N390 Urinary tract infection, site not specified: Secondary | ICD-10-CM | POA: Insufficient documentation

## 2011-07-25 DIAGNOSIS — R32 Unspecified urinary incontinence: Secondary | ICD-10-CM | POA: Insufficient documentation

## 2011-07-25 DIAGNOSIS — I10 Essential (primary) hypertension: Secondary | ICD-10-CM | POA: Insufficient documentation

## 2011-07-25 LAB — URINALYSIS, ROUTINE W REFLEX MICROSCOPIC
Bilirubin Urine: NEGATIVE
Glucose, UA: NEGATIVE mg/dL
Ketones, ur: NEGATIVE mg/dL
Nitrite: NEGATIVE
Protein, ur: NEGATIVE mg/dL
Specific Gravity, Urine: 1.024 (ref 1.005–1.030)
Urobilinogen, UA: 0.2 mg/dL (ref 0.0–1.0)
pH: 6 (ref 5.0–8.0)

## 2011-07-25 LAB — BASIC METABOLIC PANEL
BUN: 17 mg/dL (ref 6–23)
CO2: 26 mEq/L (ref 19–32)
Calcium: 9.8 mg/dL (ref 8.4–10.5)
Chloride: 105 mEq/L (ref 96–112)
Creatinine, Ser: 0.81 mg/dL (ref 0.50–1.35)
GFR calc Af Amer: >90 mL/min (ref >90)
GFR calc non Af Amer: >90 mL/min (ref >90)
Glucose, Bld: 128 mg/dL — ABNORMAL HIGH (ref 70–99)
Potassium: 3.9 mEq/L (ref 3.5–5.1)
Sodium: 142 mEq/L (ref 135–145)

## 2011-07-25 LAB — CBC
HCT: 44.6 % (ref 39.0–52.0)
Hemoglobin: 14.6 g/dL (ref 13.0–17.0)
MCH: 28.1 pg (ref 26.0–34.0)
MCHC: 32.7 g/dL (ref 30.0–36.0)
MCV: 85.8 fL (ref 78.0–100.0)
Platelets: 316 10*3/uL (ref 150–400)
RBC: 5.2 MIL/uL (ref 4.22–5.81)
RDW: 14 % (ref 11.5–15.5)
WBC: 10.6 10*3/uL — ABNORMAL HIGH (ref 4.0–10.5)

## 2011-07-25 LAB — RAPID URINE DRUG SCREEN, HOSP PERFORMED
Amphetamines: NOT DETECTED
Barbiturates: NOT DETECTED
Benzodiazepines: POSITIVE — AB
Cocaine: NOT DETECTED
Opiates: NOT DETECTED
Tetrahydrocannabinol: POSITIVE — AB

## 2011-07-25 LAB — URINE MICROSCOPIC-ADD ON

## 2011-07-25 LAB — GLUCOSE, CAPILLARY: Glucose-Capillary: 127 mg/dL — ABNORMAL HIGH (ref 70–99)

## 2011-07-25 MED ORDER — HYDROCODONE-ACETAMINOPHEN 5-325 MG PO TABS
1.0000 | ORAL_TABLET | Freq: Once | ORAL | Status: AC
  Administered 2011-07-25: 1 via ORAL
  Filled 2011-07-25: qty 1

## 2011-07-25 MED ORDER — CEPHALEXIN 500 MG PO CAPS: 500.0000 mg | ORAL_CAPSULE | Freq: Three times a day (TID) | ORAL | Status: AC

## 2011-07-25 NOTE — ED Provider Notes (Signed)
History     CSN: [id number]  Arrival date & time 07/25/11  1313   First MD Initiated Contact with Patient 07/25/11 1341      Chief Complaint  Patient presents with  . Dysuria    (Consider location/radiation/quality/duration/timing/severity/associated sxs/prior treatment) HPI Pt presents complaining of incontinence several times.  He says that he has urinated on himself about three times while awake and did not notice.  He says this has happened when he was asleep a few times over the past month, but he was sick and thought perhaps it was a side effect of medications.   Pt states that he is gay and in a monogamous relationship, but that over the past month when he climaxes, it is extremely painful and nothing comes out.  He denies any penile discharge, denies new sexual contacts or history of STD's.  Pt does admit to some back pain, but denies fevers/chills/nausea/vomiting.    He has a history of kidney stones and has had several UTI's, but has never had incontinence like this before.  He does admit to some neuropathy symptoms from time to time in his feet, but has not been diagnosed with neuropathy and denies having diabetes.   Past Medical History  Diagnosis Date  . Hypertension   . Obesity   . Arthritis   . Renal disorder   . Depression   . Migraine aura, persistent     since childhood  . Head ache     Past Surgical History  Procedure Date  . Cardiac catheterization     x2  . Appendectomy     Family History  Problem Relation Age of Onset  . Hypertension Mother   . Hyperlipidemia Mother   . Hypertension Brother     History  Substance Use Topics  . Smoking status: Current Everyday Smoker  . Smokeless tobacco: Not on file  . Alcohol Use: No      Review of Systems  Constitutional: Negative for fever.  HENT: Negative for congestion.   Eyes: Negative for visual disturbance.  Respiratory: Negative for shortness of breath.   Cardiovascular: Negative for chest  pain.  Genitourinary: Positive for dysuria, flank pain and enuresis. Negative for hematuria, discharge, scrotal swelling, genital sores and testicular pain.  Musculoskeletal: Positive for back pain.  Skin: Negative for rash.  Neurological: Negative for dizziness.  Hematological: Negative for adenopathy.    Allergies  Review of patient's allergies indicates no known allergies.  Home Medications   Current Outpatient Rx  Name Route Sig Dispense Refill  . ALBUTEROL SULFATE HFA 108 (90 BASE) MCG/ACT IN AERS Inhalation Inhale 2 puffs into the lungs every 6 (six) hours as needed. For shortness of breath.     . CLONAZEPAM 2 MG PO TABS Oral Take 2 mg by mouth 3 (three) times daily.      Marland Kitchen DIAZEPAM 5 MG PO TABS Oral Take 2 tablets (10 mg total) by mouth 2 (two) times daily. 8 tablet 0  . FLUTICASONE-SALMETEROL 500-50 MCG/DOSE IN AEPB Inhalation Inhale 2 puffs into the lungs every 12 (twelve) hours.      Marland Kitchen HYDROCODONE-ACETAMINOPHEN 5-325 MG PO TABS Oral Take 2 tablets by mouth every 4 (four) hours as needed. For pain 8 tablet 0  . LISINOPRIL 10 MG PO TABS Oral Take 10 mg by mouth daily.        BP 143/127  Pulse 115  Temp(Src) 98.3 F (36.8 C) (Oral)  Resp 18  SpO2 97%  Physical Exam  Constitutional:  Morbidly obese male, appears anxious.   HENT:  Head: Normocephalic and atraumatic.  Mouth/Throat: Oropharynx is clear and moist.  Eyes: EOM are normal. Pupils are equal, round, and reactive to light.  Neck: Normal range of motion. Neck supple.  Cardiovascular: Normal rate, regular rhythm and normal heart sounds.   Pulmonary/Chest: Effort normal and breath sounds normal. He has no rales.  Abdominal: Soft. Bowel sounds are normal. He exhibits no distension. There is no tenderness.  Genitourinary: Rectum normal, prostate normal and testes normal. Right testis shows no mass, no swelling and no tenderness. Left testis shows no mass, no swelling and no tenderness. No penile erythema or  penile tenderness. No discharge found.  Musculoskeletal: He exhibits no edema.    ED Course  Procedures (including critical care time)  Labs Reviewed  URINALYSIS, ROUTINE W REFLEX MICROSCOPIC - Abnormal; Notable for the following:    APPearance CLOUDY (*)    Hgb urine dipstick MODERATE (*)    Leukocytes, UA MODERATE (*)    All other components within normal limits  CBC - Abnormal; Notable for the following:    WBC 10.6 (*)    All other components within normal limits  BASIC METABOLIC PANEL - Abnormal; Notable for the following:    Glucose, Bld 128 (*)    All other components within normal limits  URINE RAPID DRUG SCREEN (HOSP PERFORMED) - Abnormal; Notable for the following:    Benzodiazepines POSITIVE (*)    Tetrahydrocannabinol POSITIVE (*)    All other components within normal limits  GLUCOSE, CAPILLARY - Abnormal; Notable for the following:    Glucose-Capillary 127 (*)    All other components within normal limits  URINE MICROSCOPIC-ADD ON - Abnormal; Notable for the following:    Squamous Epithelial / LPF FEW (*)    Bacteria, UA FEW (*)    All other components within normal limits  GC/CHLAMYDIA PROBE AMP, URINE  URINE CULTURE   No results found.   1. UTI (lower urinary tract infection)       MDM  Pt afebrile and stable, will discharge home with 10 day course of Keflex.  Pt provided information for finding a primary care provider (see discharge instructions).         Ardyth Gal, MD 07/25/11 1530

## 2011-07-25 NOTE — ED Notes (Signed)
States has voided on himself several times over the last couple of days  Hurts to clinax he staes

## 2011-07-25 NOTE — Discharge Instructions (Signed)
RESOURCE GUIDE  Dental Problems  Patients with Medicaid: [facility]                     [facility] [address]                                           [address] Phone:  [phone]                                                  Phone:  [phone]  If unable to pay or uninsured, contact:  [facility] or [facility] to become qualified for the adult dental clinic.  Chronic Pain Problems Contact [facility]  [phone] Patients need to be referred by their primary care doctor.  Insufficient Money for Medicine Contact [employer]:  call "211" or [facility] 271-5999.  No Primary Care Doctor Call [facility]  832-8000 Other agencies that provide inexpensive medical care    [facility]  [phone]    [facility]  [phone]    [facility]  [phone]    Women's Clinic  [phone]    [facility]  [phone]    [facility]  [phone]  Psychological Services [facility]  [phone] [facility]  [phone] [facility]   [phone] (emergency services 641-4993)  Substance Abuse Resources [facility]  336-882-2125 [facility] 336-784-9470 [facility] 336-285-9073 [facility] 336-845-3988 Residential & Outpatient Substance Abuse Program  800-659-3381  Abuse/Neglect [place] Child Abuse Hotline (336) 641-3795 [place] Child Abuse Hotline 800-378-5315 (After Hours)  Emergency Shelter [facility] (336) 271-5985  Maternity Homes [facility] (336) 275-9566 [facility] (704) 372-4663  MRSA Hotline #:   832-7006    [place] Resources  [facility] [place]     [employer]                          [facility] [address] [place]                       [address]      [address]  [place]                                                [place]                            [place] Phone:  [phone]                                   Phone:  [phone]                 Phone:  [phone]  [facility] Phone:  [phone]    [facility] Child Abuse Hotline (408) 048-4349 319-712-7162 (After Hours)Urinary Tract Infection Infections of the urinary tract can start in several places. A bladder infection (cystitis), a kidney infection (pyelonephritis), and a prostate infection (prostatitis) are different types of urinary tract infections (UTIs). They usually get better if treated with medicines (antibiotics) that kill germs. Take all the medicine until it is gone. You or your child may feel better in a few days, but TAKE ALL MEDICINE or the infection may not respond and may become more difficult to treat.  HOME CARE INSTRUCTIONS   Drink enough water and fluids to keep the urine clear or pale yellow. Cranberry juice is especially recommended, in addition to large amounts of water.   Avoid caffeine, tea, and carbonated beverages. They tend to irritate the bladder.   Alcohol may irritate the prostate.   Only take over-the-counter or prescription medicines for pain, discomfort, or fever as directed by your caregiver.  To prevent further infections:  Empty the bladder often. Avoid holding urine for long periods of time.   After a bowel movement, women should cleanse from front to back. Use each tissue only once.   Empty the bladder before and after sexual intercourse.  FINDING OUT THE RESULTS OF YOUR TEST Not all test results are available during your visit. If your or your child's test results are not back during the visit, make an appointment with your caregiver to find out the results. Do not assume everything is normal if you have not heard from your caregiver or the medical facility. It is important for you to follow up on all  test results. SEEK MEDICAL CARE IF:   There is back pain.   Your baby is older than 3 months with a rectal temperature of 100.5 F (38.1 C) or higher for more than 1 day.   Your or your child's problems (symptoms) are no better in 3 days. Return sooner if you or your child is getting worse.  SEEK IMMEDIATE MEDICAL CARE IF:   There is severe back pain or lower abdominal pain.   You or your child develops chills.   You have a fever.   Your baby is older than 3 months with a rectal temperature of 102 F (38.9 C) or higher.   Your baby is 15 months old or younger with a rectal temperature of 100.4 F (38 C) or higher.   There is nausea or vomiting.   There is continued burning or discomfort with urination.  MAKE SURE YOU:   Understand these instructions.   Will watch your condition.   Will get help right away if you are not doing well or get worse.  Document Released: 03/05/2005 Document Revised: 02/05/2011 Document Reviewed: 10/08/2006 [facility] Patient Information 2012 [place], [place].

## 2011-07-25 NOTE — ED Notes (Signed)
CBG 127 notified RN

## 2011-07-26 LAB — GC/CHLAMYDIA PROBE AMP, URINE
Chlamydia, Swab/Urine, PCR: NEGATIVE
GC Probe Amp, Urine: NEGATIVE

## 2011-07-28 ENCOUNTER — Emergency Department (HOSPITAL_COMMUNITY)
Admission: EM | Admit: 2011-07-28 | Discharge: 2011-07-29 | Disposition: A | Discharge status: Home Health | Payer: Self-pay | Attending: Emergency Medicine | Admitting: Emergency Medicine

## 2011-07-28 ENCOUNTER — Encounter (HOSPITAL_COMMUNITY): Payer: Self-pay | Admitting: Emergency Medicine

## 2011-07-28 DIAGNOSIS — M545 Low back pain, unspecified: Secondary | ICD-10-CM | POA: Insufficient documentation

## 2011-07-28 DIAGNOSIS — I10 Essential (primary) hypertension: Secondary | ICD-10-CM | POA: Insufficient documentation

## 2011-07-28 DIAGNOSIS — R32 Unspecified urinary incontinence: Secondary | ICD-10-CM | POA: Insufficient documentation

## 2011-07-28 NOTE — ED Notes (Signed)
Pt c/o urinary frequency and unable to control urine x's 3-4 weeks.  Was seen here for same on 2/15 and given antibiotics.  Pt st's now having lower back pain as well

## 2011-07-29 LAB — URINALYSIS, ROUTINE W REFLEX MICROSCOPIC
Hgb urine dipstick: NEGATIVE
Nitrite: NEGATIVE
Protein, ur: NEGATIVE mg/dL
Specific Gravity, Urine: 1.031 — ABNORMAL HIGH (ref 1.005–1.030)
Urobilinogen, UA: 0.2 mg/dL (ref 0.0–1.0)

## 2011-07-29 LAB — URINE MICROSCOPIC-ADD ON

## 2011-07-29 MED ORDER — DEXTROSE 5 % IV SOLN
1.0000 g | Freq: Once | INTRAVENOUS | Status: AC
  Administered 2011-07-29: 1 g via INTRAVENOUS
  Filled 2011-07-29: qty 10

## 2011-07-29 MED ORDER — OXYCODONE-ACETAMINOPHEN 5-325 MG PO TABS
2.0000 | ORAL_TABLET | Freq: Once | ORAL | Status: AC
  Administered 2011-07-29: 2 via ORAL
  Filled 2011-07-29: qty 2

## 2011-07-29 NOTE — ED Provider Notes (Signed)
Medical screening examination/treatment/procedure(s) were performed by non-physician practitioner and as supervising physician I was immediately available for consultation/collaboration.   Dayton Bailiff, MD 07/29/11 [phone]

## 2011-07-29 NOTE — ED Provider Notes (Signed)
History     CSN: [id number]  Arrival date & time 07/28/11  2259   First MD Initiated Contact with Patient 07/28/11 2329      Chief Complaint  Patient presents with  . Urinary Incontinence    (Consider location/radiation/quality/duration/timing/severity/associated sxs/prior treatment) Patient is a 41 y.o. male presenting with back pain. The history is provided by the patient.  Back Pain  This is a new problem. The current episode started more than 2 days ago. Episode frequency: He has had urinary incontinence for the past several weeks, now with low back pain x 2 days.  The problem has been gradually worsening. The pain is associated with no known injury. The pain is present in the lumbar spine. The quality of the pain is described as aching. The pain does not radiate. Pertinent negatives include no fever. Associated symptoms comments: He continues to have urinary incontinence. He reports he is taking Keflex prescription given to him as directed 2 days ago..    Past Medical History  Diagnosis Date  . Hypertension   . Obesity   . Arthritis   . Renal disorder   . Depression   . Migraine aura, persistent     since childhood  . Head ache     Past Surgical History  Procedure Date  . Cardiac catheterization     x2  . Appendectomy     Family History  Problem Relation Age of Onset  . Hypertension Mother   . Hyperlipidemia Mother   . Hypertension Brother     History  Substance Use Topics  . Smoking status: Current Everyday Smoker  . Smokeless tobacco: Not on file  . Alcohol Use: No      Review of Systems  Constitutional: Negative for fever and chills.  HENT: Negative.   Respiratory: Negative.   Cardiovascular: Negative.   Gastrointestinal: Negative.   Genitourinary:       See HPI.  Musculoskeletal: Positive for back pain.  Skin: Negative.   Neurological: Negative.     Allergies  Review of patient's allergies indicates no known allergies.  Home Medications     Current Outpatient Rx  Name Route Sig Dispense Refill  . ALBUTEROL SULFATE HFA 108 (90 BASE) MCG/ACT IN AERS Inhalation Inhale 2 puffs into the lungs every 6 (six) hours as needed. For shortness of breath.     . CEPHALEXIN 500 MG PO CAPS Oral Take 1 capsule (500 mg total) by mouth 3 (three) times daily. 30 capsule 0  . CLONAZEPAM 2 MG PO TABS Oral Take 2 mg by mouth 3 (three) times daily.      Marland Kitchen DIAZEPAM 5 MG PO TABS Oral Take 2 tablets (10 mg total) by mouth 2 (two) times daily. 8 tablet 0  . FLUTICASONE-SALMETEROL 500-50 MCG/DOSE IN AEPB Inhalation Inhale 2 puffs into the lungs every 12 (twelve) hours.      Marland Kitchen HYDROCODONE-ACETAMINOPHEN 5-325 MG PO TABS Oral Take 2 tablets by mouth every 4 (four) hours as needed. For pain 8 tablet 0  . LISINOPRIL 10 MG PO TABS Oral Take 10 mg by mouth daily.        BP 166/116  Pulse 119  Temp(Src) 98 F (36.7 C) (Oral)  Resp 22  SpO2 96%  Physical Exam  Constitutional: He is oriented to person, place, and time. He appears well-developed and well-nourished.  Neck: Normal range of motion.  Pulmonary/Chest: Effort normal.  Abdominal: Soft. There is no tenderness.       Morbidly obese.  Genitourinary:       No testicular tenderness, swelling or discoloration. Penis unremarkable.   Musculoskeletal: Normal range of motion.  Neurological: He is alert and oriented to person, place, and time.  Skin: Skin is warm and dry.  Psychiatric: He has a normal mood and affect.    ED Course  Procedures (including critical care time)   Labs Reviewed  URINALYSIS, ROUTINE W REFLEX MICROSCOPIC  URINE CULTURE   No results found.   No diagnosis found.    MDM          Rodena Medin, PA-C 07/29/11 [phone]

## 2011-07-29 NOTE — Discharge Instructions (Signed)
FOLLOW UP WITH YOUR CHOICE OF UROLOGIST AT [facility] FOR PERSISTENT URINARY INCONTINENCE. CONTINUE TAKING KEFLEX UNTIL GONE.   Urinary Incontinence Your doctor wants you to have this information about urinary incontinence. This is the inability to keep urine in your body until you decide to release it. CAUSES  Prostate gland enlargement is a common cause of urinary incontinence. But there are many different causes for losing urinary control. They include:  Medicines.   Infections.   Prostate problems.   Surgery.   Neurological diseases.   Emotional factors.  DIAGNOSIS  Evaluating the cause of incontinence is important in choosing the best treatment. This may require:  An ultrasound exam.   Kidney and bladder X-rays.   Cystoscopy. This is an exam of the bladder using a narrow scope.  TREATMENT  For incontinent patients, normal daily hygiene and using changing pads or adult diapers regularly will prevent offensive odors and skin damage from the moisture. Changing your medicines may help control incontinence. Your caregiver may prescribe some medicines to help you regain control. Avoid caffeine. It can over-stimulate the bladder. Use the bathroom regularly. Try about every 2 to 3 hours even if you do not feel the need. Take time to empty your bladder completely. After urinating, wait a minute. Then try to urinate again. External devices used to catch urine or an indwelling urine catheter (Foley catheter) may be needed as well. Some prostate gland problems require surgery to correct. Call your caregiver for more information. Document Released: 07/03/2004 Document Revised: 02/05/2011 Document Reviewed: 06/28/2008 [facility] Patient Information 2012 [place], [place].

## 2011-07-30 LAB — URINE CULTURE

## 2011-08-03 NOTE — ED Provider Notes (Signed)
I saw and evaluated the patient, reviewed the resident's note and I agree with the findings and plan.  Pt complaining of incontinence and painful ejaculation. Gradual onset of symptoms about a month ago.Description is not really consistent with stress or overflow incontinence. Intermittent. Sometimes nocturia. Sometimes small amounts like dribblign and sometimes seems like full bladder. Pt with no other acute complaints. Occasional back pain but this is not new. Denies trauma. Eyes dysuria. Patient states he is monogamous relationship. Denies significant history of similar symptoms. Denies testicular pain or swelling. Denies dysuria. Denies hematuria. No numbness, tingling or loss of strength. Denies history of back surgery. Denies history of IV drug abuse. Denies use of blood thinning medication. On exam pt morbidly obese. Adominal exam benign but limited de to body habitus. No penile discharge. No scrotal swelling. No testicular tenderness. No inguinal adenopathy appreciated. Rectal exams unremarkable. No external lesions noted. Good rectal tone. Prostate is not enlarged or tender. Neurological exam of the lower extremities was nonfocal. Etiology of the patient's complaint is unclear at this time.  Will tx with course of antibiotics for possible urinary tract infection. Patient was offered STD testing but is declining. Outpatient followup is symptoms persist. Term precautions were discussed per    Raeford Razor, MD 08/03/11 2233

## 2011-09-26 IMAGING — CR DG LUMBAR SPINE COMPLETE 4+V
5 series · 5 of 5 positions shown · non-contrast
Comparison: Previous examinations.

CLINICAL DATA: Low back pain following a fall.

LUMBAR SPINE - COMPLETE 4+ VIEW

[t l-spine a.p.]
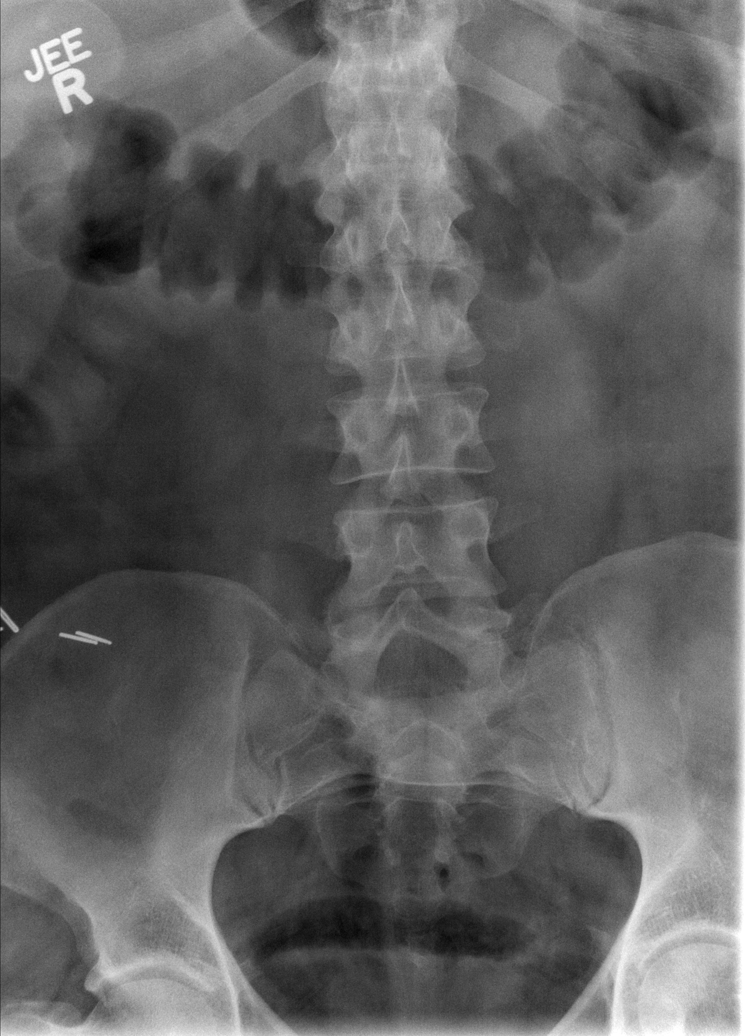

[t l-spine oblique exposure (1 of 2)]
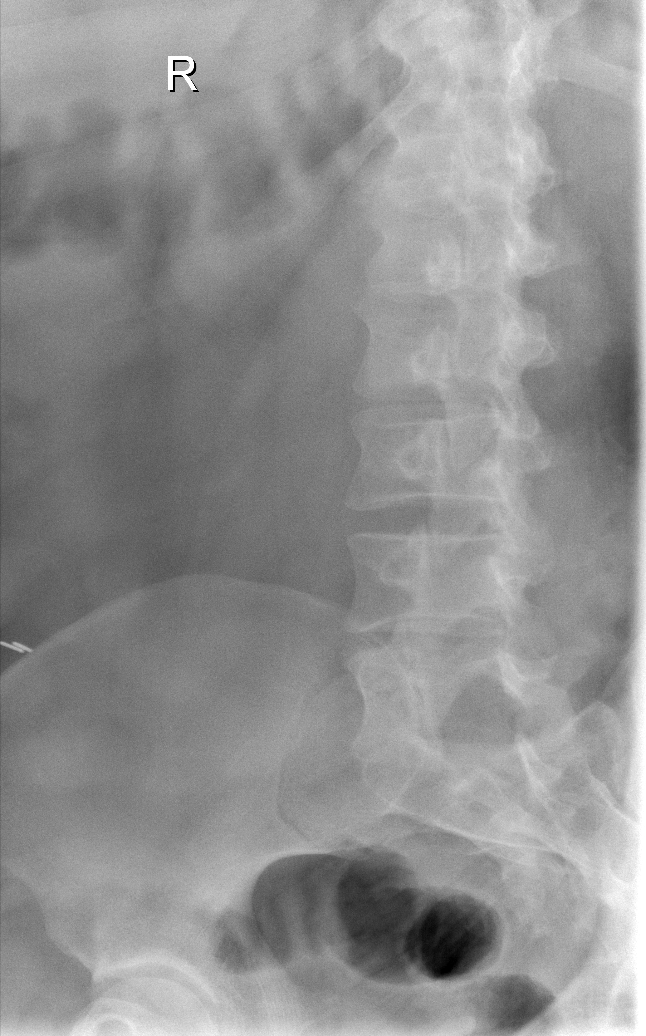

[t l-spine oblique exposure (2 of 2)]
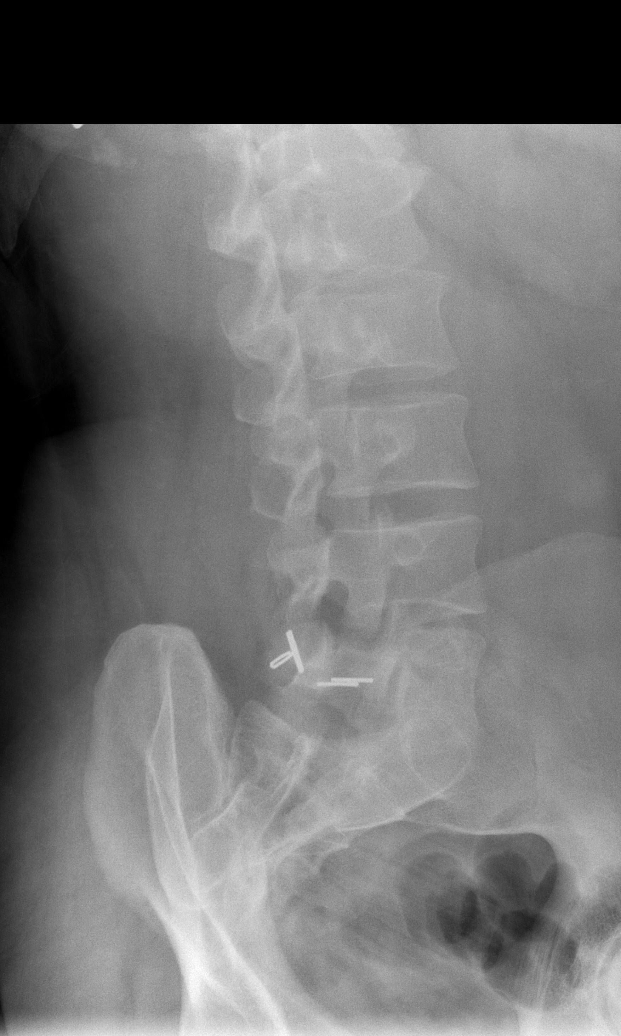

[t l-spine lat]
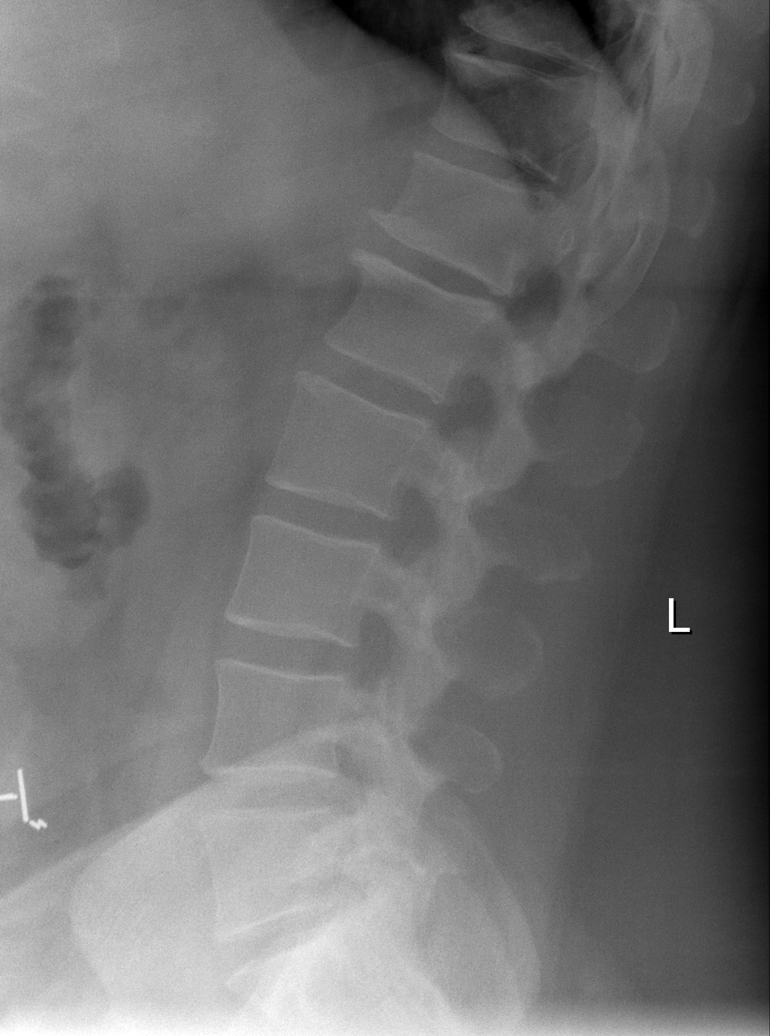

[t l-spine l5-s1 spot *]
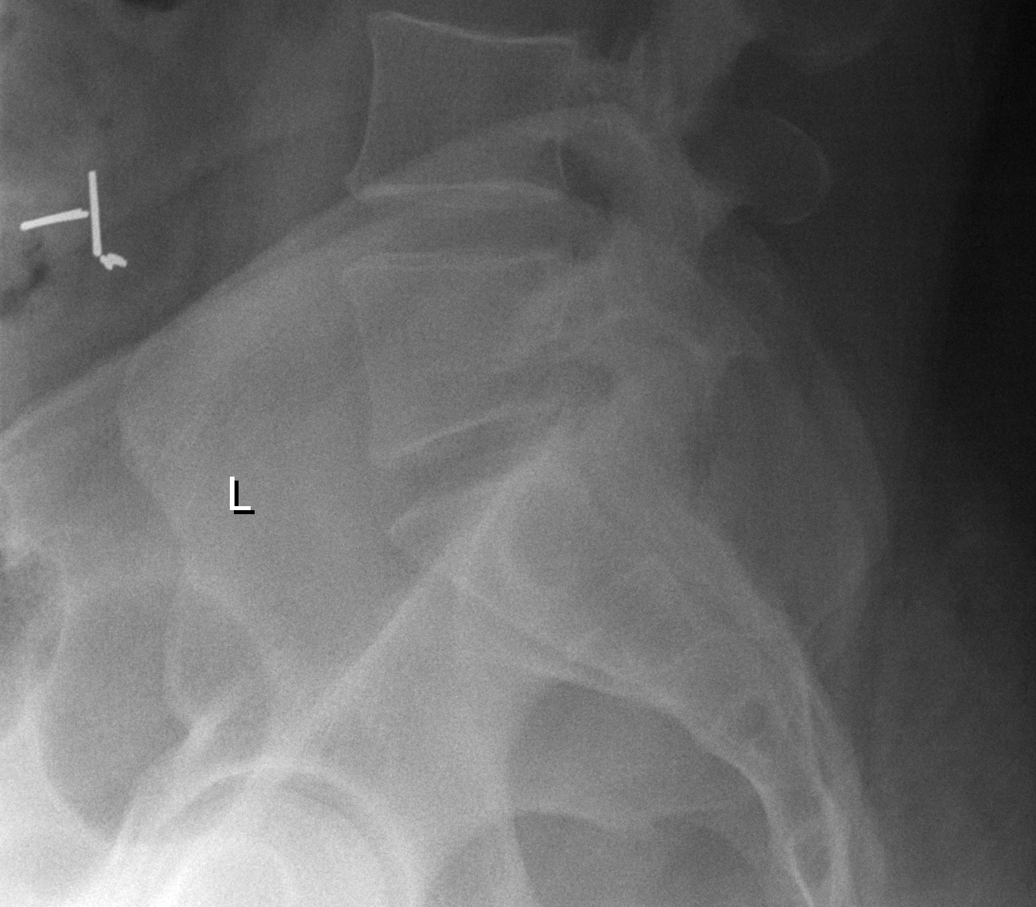

[5 of 5 positions shown; findings below may reference images not displayed]

FINDINGS: No significant change in mild multilevel degenerative
changes.  No fractures, pars defects or subluxations.  Stable right
lower quadrant surgical clips.
IMPRESSION: No acute abnormality.

## 2011-09-26 IMAGING — CR DG KNEE COMPLETE 4+V*L*
4 series · 4 of 4 positions shown · non-contrast
Comparison: 03/01/2010.

CLINICAL DATA: Left patellar pain following a fall.

LEFT KNEE - COMPLETE 4+ VIEW

[t knee ap left *]
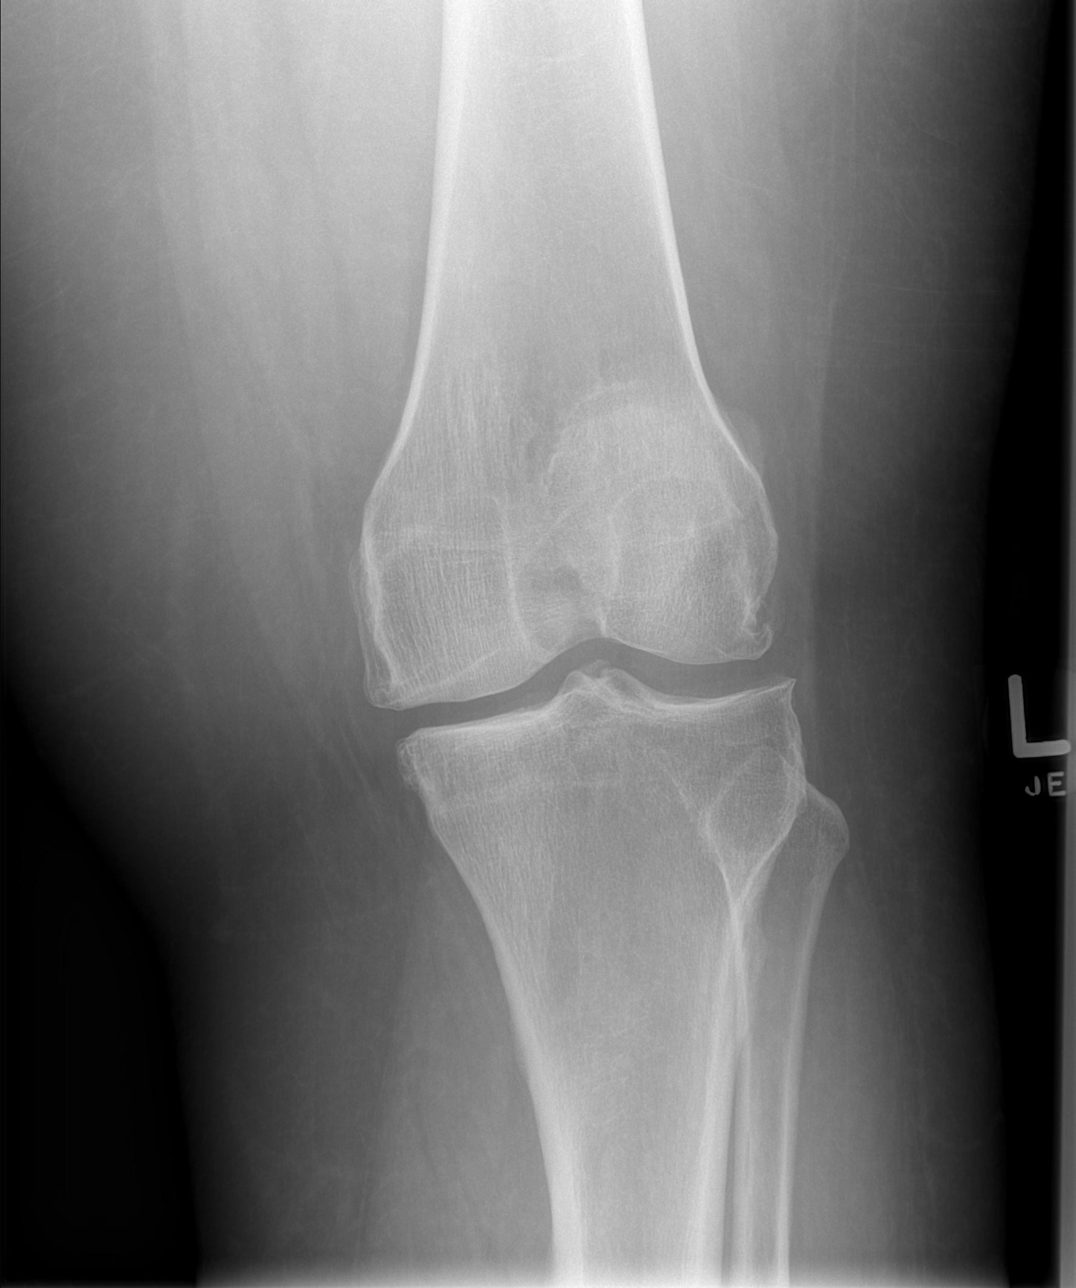

[t knee oblique left *]
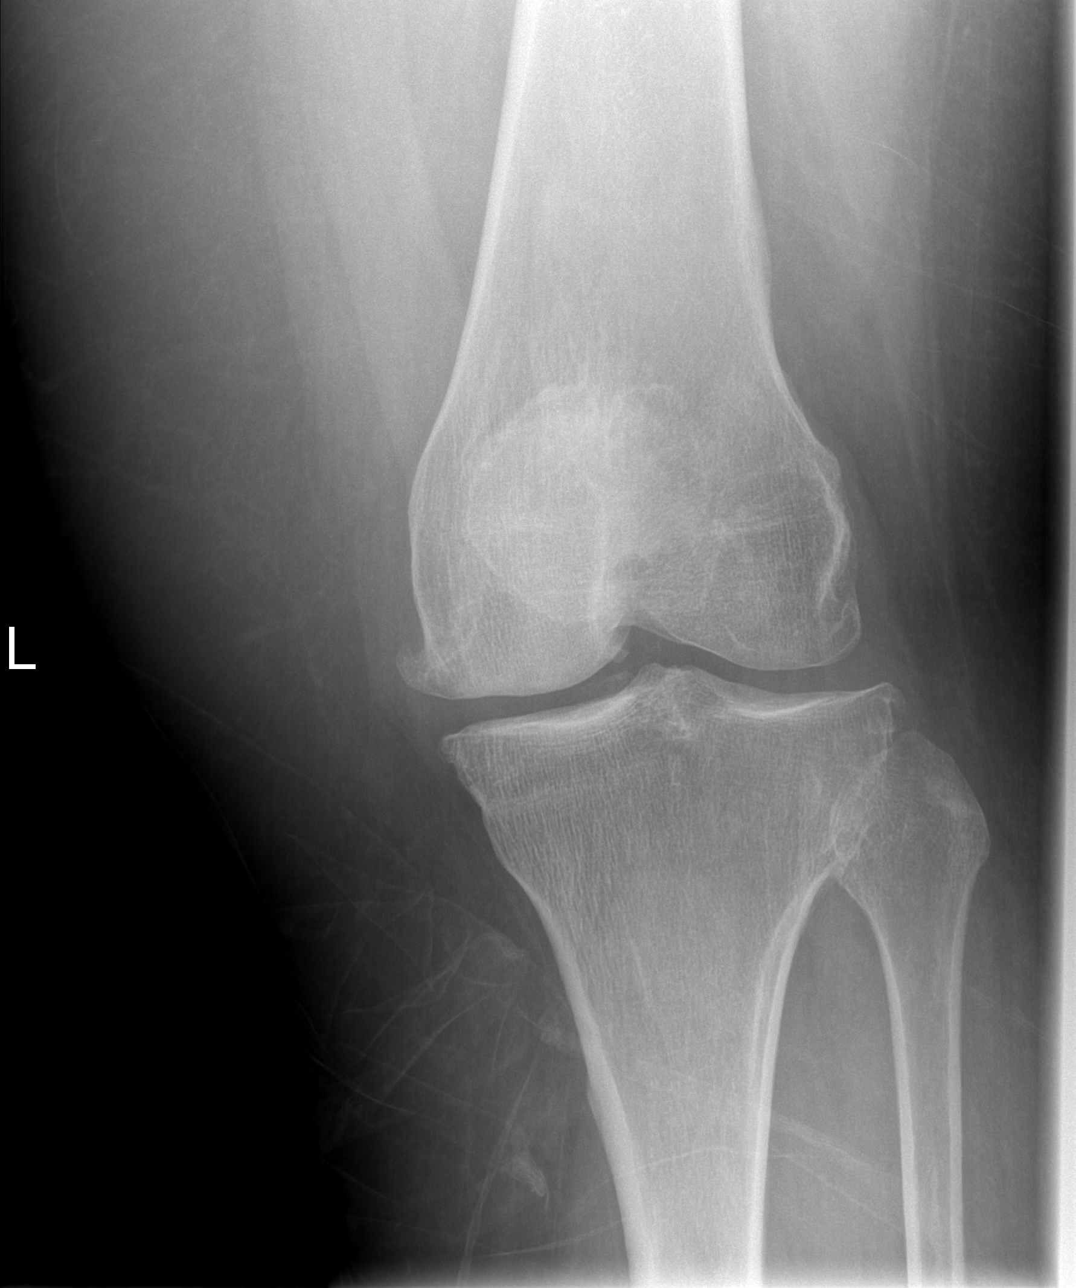

[t knee oblique left]
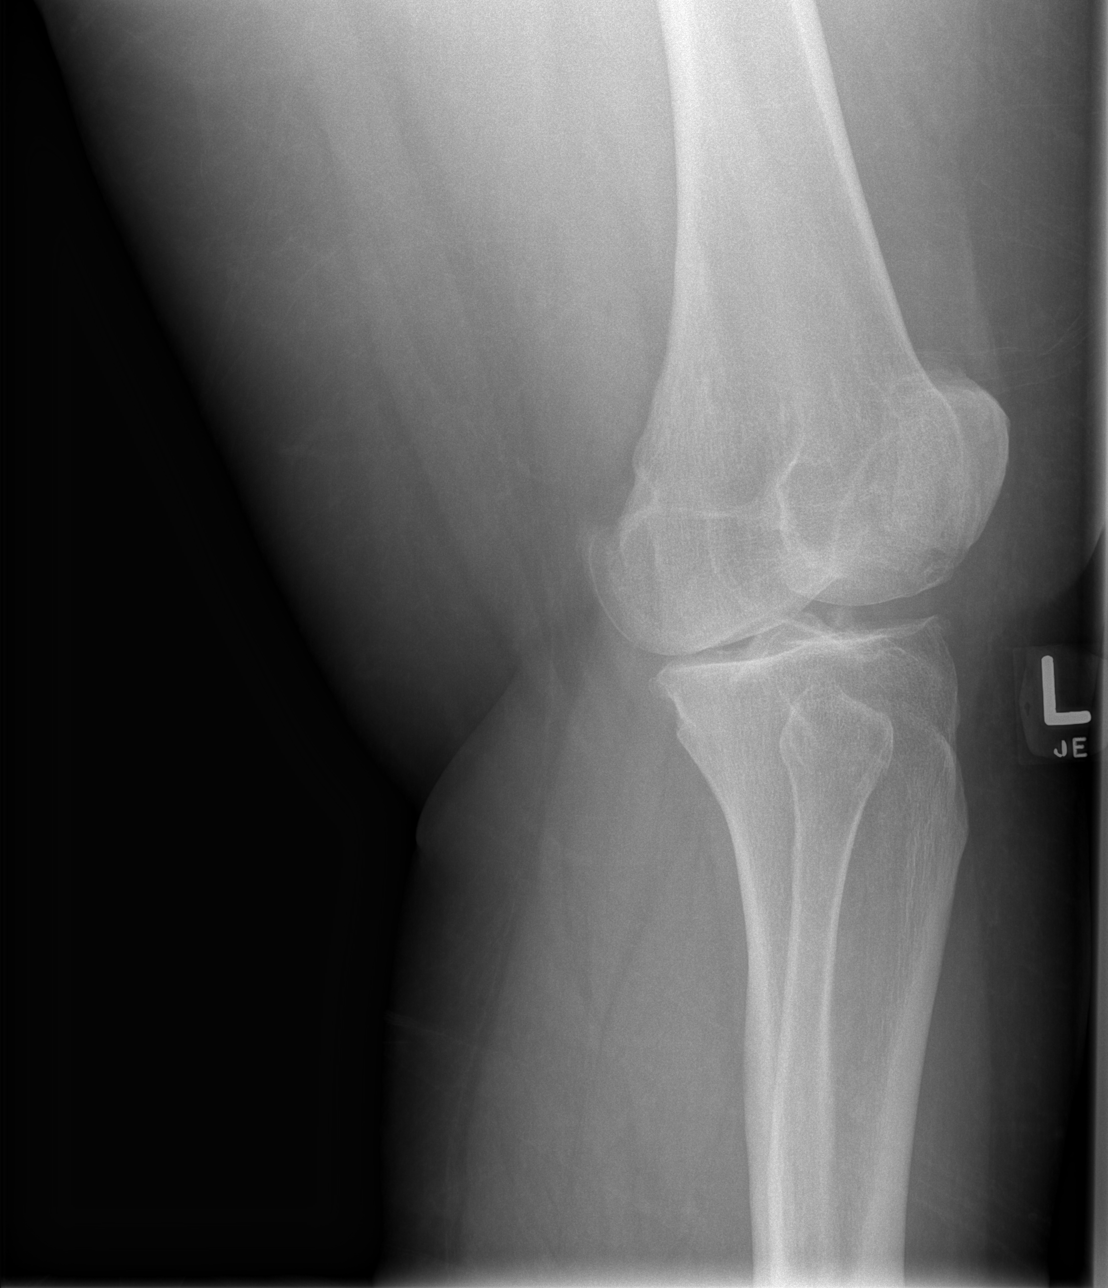

[t knee lat left]
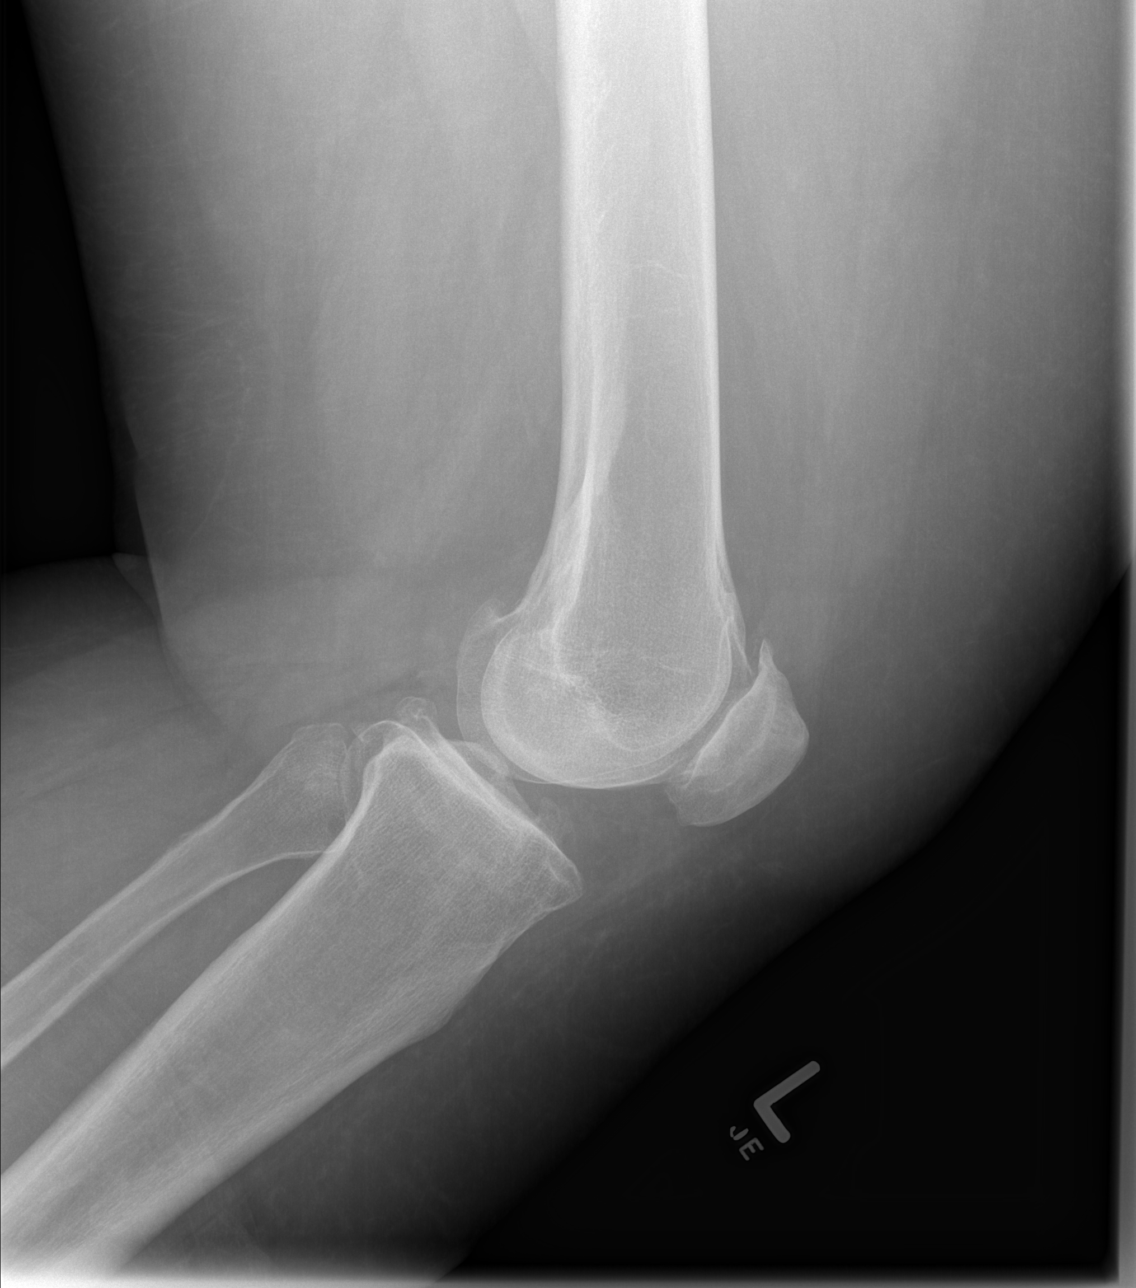

[4 of 4 positions shown; findings below may reference images not displayed]

FINDINGS: Stable previously described tricompartmental degenerative
changes.  No fracture, dislocation or effusion.
IMPRESSION: 1.  No fracture.
2.  Stable tricompartmental degenerative changes.

## 2014-02-18 ENCOUNTER — Emergency Department (HOSPITAL_BASED_OUTPATIENT_CLINIC_OR_DEPARTMENT_OTHER)
Admission: EM | Admit: 2014-02-18 | Discharge: 2014-02-18 | Disposition: A | Discharge status: Home / Self Care | Payer: Medicaid - Out of State | Attending: Emergency Medicine | Admitting: Emergency Medicine

## 2014-02-18 ENCOUNTER — Encounter (HOSPITAL_BASED_OUTPATIENT_CLINIC_OR_DEPARTMENT_OTHER): Payer: Self-pay | Admitting: Emergency Medicine

## 2014-02-18 DIAGNOSIS — F411 Generalized anxiety disorder: Secondary | ICD-10-CM | POA: Insufficient documentation

## 2014-02-18 DIAGNOSIS — J45909 Unspecified asthma, uncomplicated: Secondary | ICD-10-CM | POA: Diagnosis not present

## 2014-02-18 DIAGNOSIS — F17200 Nicotine dependence, unspecified, uncomplicated: Secondary | ICD-10-CM | POA: Insufficient documentation

## 2014-02-18 DIAGNOSIS — I10 Essential (primary) hypertension: Secondary | ICD-10-CM | POA: Insufficient documentation

## 2014-02-18 DIAGNOSIS — E669 Obesity, unspecified: Secondary | ICD-10-CM | POA: Diagnosis not present

## 2014-02-18 DIAGNOSIS — F3289 Other specified depressive episodes: Secondary | ICD-10-CM | POA: Insufficient documentation

## 2014-02-18 DIAGNOSIS — Z79899 Other long term (current) drug therapy: Secondary | ICD-10-CM | POA: Insufficient documentation

## 2014-02-18 DIAGNOSIS — F329 Major depressive disorder, single episode, unspecified: Secondary | ICD-10-CM | POA: Insufficient documentation

## 2014-02-18 DIAGNOSIS — I2510 Atherosclerotic heart disease of native coronary artery without angina pectoris: Secondary | ICD-10-CM | POA: Diagnosis not present

## 2014-02-18 DIAGNOSIS — IMO0002 Reserved for concepts with insufficient information to code with codable children: Secondary | ICD-10-CM | POA: Insufficient documentation

## 2014-02-18 DIAGNOSIS — Z8739 Personal history of other diseases of the musculoskeletal system and connective tissue: Secondary | ICD-10-CM | POA: Insufficient documentation

## 2014-02-18 DIAGNOSIS — G43509 Persistent migraine aura without cerebral infarction, not intractable, without status migrainosus: Secondary | ICD-10-CM | POA: Insufficient documentation

## 2014-02-18 DIAGNOSIS — Z760 Encounter for issue of repeat prescription: Secondary | ICD-10-CM | POA: Insufficient documentation

## 2014-02-18 DIAGNOSIS — K219 Gastro-esophageal reflux disease without esophagitis: Secondary | ICD-10-CM | POA: Diagnosis not present

## 2014-02-18 HISTORY — DX: Unspecified asthma, uncomplicated: J45.909

## 2014-02-18 HISTORY — DX: Atherosclerotic heart disease of native coronary artery without angina pectoris: I25.10

## 2014-02-18 HISTORY — DX: Gastro-esophageal reflux disease without esophagitis: K21.9

## 2014-02-18 MED ORDER — ALPRAZOLAM 1 MG PO TABS: 1.0000 mg | ORAL_TABLET | Freq: Three times a day (TID) | ORAL | Status: DC | PRN

## 2014-02-18 MED ORDER — ALPRAZOLAM 0.5 MG PO TABS
1.0000 mg | ORAL_TABLET | Freq: Once | ORAL | Status: AC
  Administered 2014-02-18: 1 mg via ORAL
  Filled 2014-02-18: qty 2

## 2014-02-18 NOTE — ED Notes (Signed)
Pt discharged to home with family. NAD.  

## 2014-02-18 NOTE — ED Notes (Signed)
[place] and out of xanax, has had none since Thursday. Uses for anxiety and panic disorder

## 2014-02-18 NOTE — ED Provider Notes (Signed)
CSN: [id number]     Arrival date & time 02/18/14  1136 History   First MD Initiated Contact with Patient 02/18/14 1250     Chief Complaint  Patient presents with  . Medication Refill     (Consider location/radiation/quality/duration/timing/severity/associated sxs/prior Treatment) HPI Patient reports he ran out of his Xanax 2 days ago. He is visiting from [place]. He is here for 3 weeks. He was not able to get his Xanax refilled as it requires a paper prescription. He has all his other medications. He presently feels anxious. No other associated symptoms. Past Medical History  Diagnosis Date  . Hypertension   . Obesity   . Arthritis   . Renal disorder   . Depression   . Migraine aura, persistent     since childhood  . Head ache   . Coronary artery disease   . GERD (gastroesophageal reflux disease)   . Asthma    Past Surgical History  Procedure Laterality Date  . Cardiac catheterization      x2  . Appendectomy     Family History  Problem Relation Age of Onset  . Hypertension Mother   . Hyperlipidemia Mother   . Hypertension Brother    History  Substance Use Topics  . Smoking status: Current Every Day Smoker  . Smokeless tobacco: Not on file  . Alcohol Use: No   no illicit drug use  Review of Systems  Constitutional: Negative.   HENT: Negative.   Respiratory: Negative.   Cardiovascular: Negative.   Gastrointestinal: Negative.   Musculoskeletal: Negative.   Skin: Negative.   Neurological: Negative.   Psychiatric/Behavioral: Positive for agitation.  All other systems reviewed and are negative.     Allergies  Review of patient's allergies indicates no known allergies.  Home Medications   Prior to Admission medications   Medication Sig Start Date End Date Taking? Authorizing Provider  ALPRAZolam [name]) 1 MG tablet Take 1 mg by mouth 3 (three) times daily.   Yes Historical Provider, MD  clopidogrel (PLAVIX) 75 MG tablet Take 75 mg by mouth daily.    Yes Historical Provider, MD  fluticasone (FLONASE) 50 MCG/ACT nasal spray Place 2 sprays into both nostrils daily.   Yes Historical Provider, MD  gabapentin (NEURONTIN) 800 MG tablet Take 800 mg by mouth 3 (three) times daily.   Yes Historical Provider, MD  metFORMIN (GLUCOPHAGE) 500 MG tablet Take by mouth 2 (two) times daily with a meal.   Yes Historical Provider, MD  metoprolol tartrate (LOPRESSOR) 25 MG tablet Take 25 mg by mouth once.   Yes Historical Provider, MD  omeprazole (PRILOSEC) 20 MG capsule Take 20 mg by mouth daily.   Yes Historical Provider, MD  pravastatin (PRAVACHOL) 40 MG tablet Take 40 mg by mouth daily.   Yes Historical Provider, MD  albuterol (PROVENTIL HFA;VENTOLIN HFA) 108 (90 BASE) MCG/ACT inhaler Inhale 2 puffs into the lungs every 6 (six) hours as needed. For shortness of breath.     Historical Provider, MD  Fluticasone-Salmeterol (ADVAIR) 500-50 MCG/DOSE AEPB Inhale 2 puffs into the lungs every 12 (twelve) hours.      Historical Provider, MD  lisinopril (PRINIVIL,ZESTRIL) 10 MG tablet Take 10 mg by mouth daily.      Historical Provider, MD   BP 167/74  Pulse 95  Temp(Src) 98.3 F (36.8 C) (Oral)  Resp 20  Ht 6' (1.829 m)  Wt 310 lb (140.615 kg)  BMI 42.03 kg/m2  SpO2 97% Physical Exam  Nursing note and vitals  reviewed. Constitutional: He appears well-developed and well-nourished.  Anxious appearing  HENT:  Head: Normocephalic and atraumatic.  Eyes: Conjunctivae are normal. Pupils are equal, round, and reactive to light.  Neck: Neck supple. No tracheal deviation present. No thyromegaly present.  Cardiovascular: Normal rate and regular rhythm.   No murmur heard. Pulmonary/Chest: Effort normal and breath sounds normal.  Abdominal: Soft. Bowel sounds are normal. He exhibits no distension. There is no tenderness.  Morbidly obese  Musculoskeletal: Normal range of motion. He exhibits no edema and no tenderness.  Neurological: He is alert. Coordination normal.   Skin: Skin is warm and dry. No rash noted.  Psychiatric: He has a normal mood and affect.    ED Course  Procedures (including critical care time) Labs Review Labs Reviewed - No data to display  Imaging Review No results found.   EKG Interpretation None      MDM  Plan prescription Xanax. Blood pressure recheck 3 weeks Diagnosis #1 medication refill #2 anxiety #3 hypertension Final diagnoses:  None        Doug Sou, MD 02/18/14 1325

## 2014-02-18 NOTE — Discharge Instructions (Signed)
Medication Refill, Emergency Department Call your primary care physician in 2 days to ask her to mail a prescription to you for Xanax. Get your blood pressure recheck within the next 3 weeks . Today's was elevated at 158/98. We have refilled your medication today as a courtesy to you. It is best for your medical care, however, to take care of getting refills done through your primary caregiver's office. They have your records and can do a better job of follow-up than we can in the emergency department. On maintenance medications, we often only prescribe enough medications to get you by until you are able to see your regular caregiver. This is a more expensive way to refill medications. In the future, please plan for refills so that you will not have to use the emergency department for this. Thank you for your help. Your help allows [place] to better take care of the daily emergencies that enter our department. Document Released: 09/12/2003 Document Revised: 08/18/2011 Document Reviewed: 09/02/2013 [facility] Patient Information 2015 [place], [place]. This information is not intended to replace advice given to you by your health care provider. Make sure you discuss any questions you have with your health care provider.

## 2014-04-09 ENCOUNTER — Emergency Department (HOSPITAL_BASED_OUTPATIENT_CLINIC_OR_DEPARTMENT_OTHER): Payer: Medicaid - Out of State

## 2014-04-09 ENCOUNTER — Emergency Department (HOSPITAL_BASED_OUTPATIENT_CLINIC_OR_DEPARTMENT_OTHER): Payer: Self-pay

## 2014-04-09 ENCOUNTER — Emergency Department (HOSPITAL_BASED_OUTPATIENT_CLINIC_OR_DEPARTMENT_OTHER)
Admission: EM | Admit: 2014-04-09 | Discharge: 2014-04-09 | Disposition: A | Discharge status: Home / Self Care | Payer: Medicaid - Out of State | Attending: Emergency Medicine | Admitting: Emergency Medicine

## 2014-04-09 ENCOUNTER — Encounter (HOSPITAL_BASED_OUTPATIENT_CLINIC_OR_DEPARTMENT_OTHER): Payer: Self-pay | Admitting: *Deleted

## 2014-04-09 DIAGNOSIS — M5441 Lumbago with sciatica, right side: Secondary | ICD-10-CM | POA: Insufficient documentation

## 2014-04-09 DIAGNOSIS — Y9389 Activity, other specified: Secondary | ICD-10-CM | POA: Insufficient documentation

## 2014-04-09 DIAGNOSIS — F329 Major depressive disorder, single episode, unspecified: Secondary | ICD-10-CM | POA: Insufficient documentation

## 2014-04-09 DIAGNOSIS — Z7951 Long term (current) use of inhaled steroids: Secondary | ICD-10-CM | POA: Insufficient documentation

## 2014-04-09 DIAGNOSIS — M1990 Unspecified osteoarthritis, unspecified site: Secondary | ICD-10-CM | POA: Insufficient documentation

## 2014-04-09 DIAGNOSIS — Z79899 Other long term (current) drug therapy: Secondary | ICD-10-CM | POA: Insufficient documentation

## 2014-04-09 DIAGNOSIS — W010XXA Fall on same level from slipping, tripping and stumbling without subsequent striking against object, initial encounter: Secondary | ICD-10-CM | POA: Insufficient documentation

## 2014-04-09 DIAGNOSIS — I10 Essential (primary) hypertension: Secondary | ICD-10-CM | POA: Insufficient documentation

## 2014-04-09 DIAGNOSIS — Y92000 Kitchen of unspecified non-institutional (private) residence as  the place of occurrence of the external cause: Secondary | ICD-10-CM | POA: Insufficient documentation

## 2014-04-09 DIAGNOSIS — Z9889 Other specified postprocedural states: Secondary | ICD-10-CM | POA: Insufficient documentation

## 2014-04-09 DIAGNOSIS — W1789XA Other fall from one level to another, initial encounter: Secondary | ICD-10-CM | POA: Insufficient documentation

## 2014-04-09 DIAGNOSIS — M546 Pain in thoracic spine: Secondary | ICD-10-CM

## 2014-04-09 DIAGNOSIS — Z7902 Long term (current) use of antithrombotics/antiplatelets: Secondary | ICD-10-CM | POA: Insufficient documentation

## 2014-04-09 DIAGNOSIS — G43509 Persistent migraine aura without cerebral infarction, not intractable, without status migrainosus: Secondary | ICD-10-CM | POA: Insufficient documentation

## 2014-04-09 DIAGNOSIS — K219 Gastro-esophageal reflux disease without esophagitis: Secondary | ICD-10-CM | POA: Insufficient documentation

## 2014-04-09 DIAGNOSIS — I2510 Atherosclerotic heart disease of native coronary artery without angina pectoris: Secondary | ICD-10-CM | POA: Insufficient documentation

## 2014-04-09 DIAGNOSIS — J45909 Unspecified asthma, uncomplicated: Secondary | ICD-10-CM | POA: Insufficient documentation

## 2014-04-09 DIAGNOSIS — Z87448 Personal history of other diseases of urinary system: Secondary | ICD-10-CM | POA: Insufficient documentation

## 2014-04-09 DIAGNOSIS — Z720 Tobacco use: Secondary | ICD-10-CM | POA: Insufficient documentation

## 2014-04-09 DIAGNOSIS — I252 Old myocardial infarction: Secondary | ICD-10-CM | POA: Insufficient documentation

## 2014-04-09 DIAGNOSIS — Z7982 Long term (current) use of aspirin: Secondary | ICD-10-CM | POA: Insufficient documentation

## 2014-04-09 DIAGNOSIS — R52 Pain, unspecified: Secondary | ICD-10-CM

## 2014-04-09 MED ORDER — OXYCODONE-ACETAMINOPHEN 5-325 MG PO TABS: 1.0000 | ORAL_TABLET | Freq: Four times a day (QID) | ORAL | Status: DC | PRN

## 2014-04-09 MED ORDER — OXYCODONE-ACETAMINOPHEN 5-325 MG PO TABS
2.0000 | ORAL_TABLET | Freq: Once | ORAL | Status: AC
  Administered 2014-04-09: 2 via ORAL
  Filled 2014-04-09: qty 2

## 2014-04-09 NOTE — ED Provider Notes (Addendum)
CSN: [id number]     Arrival date & time 04/09/14  1726 History  This chart was scribed for Richard Anderson Lynne Righi, MD by Gwenyth Oberatherine Macek, ED Scribe. This patient was seen in room MH01/MH01 and the patient's care was started at 6:15 PM.     Chief Complaint  Patient presents with  . Back Pain   The history is provided by the patient. No language interpreter was used.    HPI Comments: Sundra AlandWilliam L Socarras is a 43 y.o. male with a history of CAD, MI, HTN, and anxiety who presents to the Emergency Department complaining of constant bilateral, lower midline back pain, worse on the right side, that radiates to his proximal right thigh after falling off the back of a stationary truck 1 week ago. He states pain became worse yesterday after he slipped and fell onto his back in his kitchen. He notes nausea and decreased appetite as associated symptoms. Pain is worse with movement. improved with remaining stillPt tried Ibuprofen and Naproxen with no relief. He smokes cigarettes and marijuana, but denies EtOH use. Slight urinary stress incontinence when coughing several days ago. No incontinence todayarea no other associated symptoms.  Past Medical History  Diagnosis Date  . Hypertension   . Obesity   . Arthritis   . Renal disorder   . Depression   . Migraine aura, persistent     since childhood  . Head ache   . Coronary artery disease   . GERD (gastroesophageal reflux disease)   . Asthma    Past Surgical History  Procedure Laterality Date  . Cardiac catheterization      x2  . Appendectomy     Family History  Problem Relation Age of Onset  . Hypertension Mother   . Hyperlipidemia Mother   . Hypertension Brother    History  Substance Use Topics  . Smoking status: Current Every Day Smoker  . Smokeless tobacco: Not on file  . Alcohol Use: No    Review of Systems  Constitutional: Negative for fever, chills, diaphoresis and fatigue.  HENT: Negative for mouth sores, sore throat and trouble  swallowing.   Eyes: Negative for visual disturbance.  Respiratory: Negative for cough, chest tightness, shortness of breath and wheezing.   Cardiovascular: Negative for chest pain.  Gastrointestinal: Positive for nausea. Negative for vomiting, abdominal pain, diarrhea and abdominal distention.  Endocrine: Negative for polydipsia, polyphagia and polyuria.  Genitourinary: Negative for dysuria, frequency and hematuria.  Musculoskeletal: Positive for back pain. Negative for gait problem.  Skin: Negative for color change, pallor and rash.  Neurological: Negative for dizziness, syncope, light-headedness and headaches.  Hematological: Does not bruise/bleed easily.  Psychiatric/Behavioral: Negative for behavioral problems and confusion.  All other systems reviewed and are negative.     Allergies  Trazodone and nefazodone  Home Medications   Prior to Admission medications   Medication Sig Start Date End Date Taking? Authorizing Provider  aspirin 81 MG tablet Take 81 mg by mouth daily.   Yes Historical Provider, MD  albuterol (PROVENTIL HFA;VENTOLIN HFA) 108 (90 BASE) MCG/ACT inhaler Inhale 2 puffs into the lungs every 6 (six) hours as needed. For shortness of breath.     Historical Provider, MD  ALPRAZolam [name](XANAX) 1 MG tablet Take 1 mg by mouth 3 (three) times daily.    Historical Provider, MD  ALPRAZolam [name](XANAX) 1 MG tablet Take 1 tablet (1 mg total) by mouth 3 (three) times daily as needed for anxiety. 02/18/14   Richard Anderson Mercia Dowe, MD  clopidogrel (PLAVIX)  75 MG tablet Take 75 mg by mouth daily.    Historical Provider, MD  fluticasone (FLONASE) 50 MCG/ACT nasal spray Place 2 sprays into both nostrils daily.    Historical Provider, MD  Fluticasone-Salmeterol (ADVAIR) 500-50 MCG/DOSE AEPB Inhale 2 puffs into the lungs every 12 (twelve) hours.      Historical Provider, MD  gabapentin (NEURONTIN) 800 MG tablet Take 800 mg by mouth 3 (three) times daily.    Historical Provider, MD  lisinopril  (PRINIVIL,ZESTRIL) 10 MG tablet Take 10 mg by mouth daily.      Historical Provider, MD  metFORMIN (GLUCOPHAGE) 500 MG tablet Take by mouth 2 (two) times daily with a meal.    Historical Provider, MD  metoprolol tartrate (LOPRESSOR) 25 MG tablet Take 25 mg by mouth once.    Historical Provider, MD  omeprazole (PRILOSEC) 20 MG capsule Take 20 mg by mouth daily.    Historical Provider, MD  pravastatin (PRAVACHOL) 40 MG tablet Take 40 mg by mouth daily.    Historical Provider, MD   BP 179/108 mmHg  Pulse 95  Temp(Src) 98 F (36.7 C) (Oral)  Resp 18  Ht 6' (1.829 m)  Wt 330 lb (149.687 kg)  BMI 44.75 kg/m2  SpO2 100% Physical Exam  Constitutional: He appears well-developed and well-nourished.  HENT:  Head: Normocephalic and atraumatic.  Eyes: Conjunctivae are normal. Pupils are equal, round, and reactive to light.  Neck: Neck supple. No tracheal deviation present. No thyromegaly present.  Cardiovascular: Normal rate and regular rhythm.   No murmur heard. Pulmonary/Chest: Effort normal and breath sounds normal.  Abdominal: Soft. Bowel sounds are normal. He exhibits no distension. There is no tenderness.  Morbidly obese  Musculoskeletal: Normal range of motion. He exhibits no edema or tenderness.  No point tenderness along spine. Has pain at lower thoracic area and lumbar area when he stands up from a seated position. Gait normal motor strength 5 over 5 overall  Neurological: He is alert. No cranial nerve deficit. Coordination normal.  DTR symmetric bilaterally at knee jerk ankle jerk and biceps toes downward going bilaterally  Skin: Skin is warm and dry. No rash noted.  Psychiatric: He has a normal mood and affect.  Nursing note and vitals reviewed.   ED Course  Procedures (including critical care time) DIAGNOSTIC STUDIES: Oxygen Saturation is 100% on RA, normal by my interpretation.    COORDINATION OF CARE: 6:20 PM Discussed treatment plan with pt at bedside and pt agreed to  plan.    Labs Review Labs Reviewed - No data to display  Imaging Review No results found.   EKG Interpretation None     7:55 PM pain improved after treatment with Percocet. X-rays viewed by me. Results for orders placed or performed during the hospital encounter of 07/28/11  Urine culture  Result Value Ref Range   Specimen Description URINE, CLEAN CATCH    Special Requests NONE    Culture  Setup Time [id number]    Colony Count >=100,000 COLONIES/ML    Culture      Multiple bacterial morphotypes present, none predominant. Suggest appropriate recollection if clinically indicated.   Report Status 07/30/2011 FINAL   Urinalysis, Routine w reflex microscopic  Result Value Ref Range   Color, Urine YELLOW YELLOW   APPearance CLOUDY (A) CLEAR   Specific Gravity, Urine 1.031 (H) 1.005 - 1.030   pH 5.5 5.0 - 8.0   Glucose, UA NEGATIVE NEGATIVE mg/dL   Hgb urine dipstick NEGATIVE NEGATIVE   Bilirubin  Urine NEGATIVE NEGATIVE   Ketones, ur NEGATIVE NEGATIVE mg/dL   Protein, ur NEGATIVE NEGATIVE mg/dL   Urobilinogen, UA 0.2 0.0 - 1.0 mg/dL   Nitrite NEGATIVE NEGATIVE   Leukocytes, UA SMALL (A) NEGATIVE  Urine microscopic-add on  Result Value Ref Range   Squamous Epithelial / LPF MANY (A) RARE   WBC, UA 11-20 <3 WBC/hpf   RBC / HPF 0-2 <3 RBC/hpf   Bacteria, UA FEW (A) RARE   Crystals CA OXALATE CRYSTALS (A) NEGATIVE   Urine-Other MUCOUS PRESENT    Dg Thoracic Spine 2 View  04/09/2014   CLINICAL DATA:  Larey SeatFell out of truck 1 week ago. Complains of low and upper back pain.  EXAM: THORACIC SPINE - 2 VIEW  COMPARISON:  03/24/2014  FINDINGS: Degenerative endplate changes and bridging osteophytes in the lower thoracic spine. Alignment of the thoracic spine is within normal limits. The vertebral body heights are maintained. Bone detail on the lateral view is limited due to body habitus. Normal alignment at the thoracolumbar junction. Normal alignment at the cervical-thoracic junction.   IMPRESSION: No acute bone abnormality in the thoracic spine.  Multilevel degenerative disease.   Electronically Signed   By: Richarda OverlieAdam  Henn M.D.   On: 04/09/2014 18:52   Dg Lumbar Spine Complete  04/09/2014   CLINICAL DATA:  Fall 1 week ago. Initial encounter. Twisting injury with back pain yesterday.  EXAM: LUMBAR SPINE - COMPLETE 4+ VIEW  COMPARISON:  03/24/2014 CT.  FINDINGS: Five lumbar type vertebral bodies. Rudimentary T12 ribs. Vertebral body height is preserved. Mild levoconvex curve with the apex at L1-L2. No pars defects. Surgical clips in the RIGHT lower quadrant. There is no fracture or acute osseous abnormality. T12-L1 and L1-L2 moderate spondylosis with discogenic endplate sclerosis and marginal osteophytes. Disc space loss at both levels.  IMPRESSION: No acute abnormality. T12-L1 and L1-L2 predominant thoracolumbar spondylosis. No change from CT 03/24/2014.   Electronically Signed   By: Andreas NewportGeoffrey  Lamke M.D.   On: 04/09/2014 18:52    MDM  Plan prescription Percocet. Follow-up urgent care center if significant pain in one week. He is instructed not to take Percocet together with Xanax.blood pressure recheck 3 weeks Diagnosis #1 fall #2 back pain #3 hypertension Final diagnoses:  None          Richard Anderson Caralynn Gelber, MD 04/09/14 2004  Richard Anderson Derold Dorsch, MD 04/09/14 2005

## 2014-04-09 NOTE — ED Notes (Signed)
Last week fell off the back of a truck onto his back, was not seen at that time. Last night slipped and fell again onto his back. Now c/o lower back pain that radiates to his legs

## 2014-04-09 NOTE — Discharge Instructions (Signed)
Back Injury Prevention Take Tylenol for mild pain or the pain medicine prescribed for bad pain. Do not take the pain medicine prescribed together with Xanax or alcohol as the combination could be dangerous.seen in urgent care center if you continue to have significant back pain in 3 or 4 days. Your x-rays showed arthritis in your spine but no new injury or broken bones. Get your blood pressure recheck within the next 3 weeks. Today's was elevated at 142/100 Back injuries can be extremely painful and difficult to heal. After having one back injury, you are much more likely to experience another later on. It is important to learn how to avoid injuring or re-injuring your back. The following tips can help you to prevent a back injury. PHYSICAL FITNESS  Exercise regularly and try to develop good tone in your abdominal muscles. Your abdominal muscles provide a lot of the support needed by your back.  Do aerobic exercises (walking, jogging, biking, swimming) regularly.  Do exercises that increase balance and strength (tai chi, yoga) regularly. This can decrease your risk of falling and injuring your back.  Stretch before and after exercising.  Maintain a healthy weight. The more you weigh, the more stress is placed on your back. For every pound of weight, 10 times that amount of pressure is placed on the back. DIET  Talk to your caregiver about how much calcium and vitamin D you need per day. These nutrients help to prevent weakening of the bones (osteoporosis). Osteoporosis can cause broken (fractured) bones that lead to back pain.  Include good sources of calcium in your diet, such as dairy products, green, leafy vegetables, and products with calcium added (fortified).  Include good sources of vitamin D in your diet, such as milk and foods that are fortified with vitamin D.  Consider taking a nutritional supplement or a multivitamin if needed.  Stop smoking if you smoke. POSTURE  Sit and stand  up straight. Avoid leaning forward when you sit or hunching over when you stand.  Choose chairs with good low back (lumbar) support.  If you work at a desk, sit close to your work so you do not need to lean over. Keep your chin tucked in. Keep your neck drawn back and elbows bent at a right angle. Your arms should look like the letter "L."  Sit high and close to the steering wheel when you drive. Add a lumbar support to your car seat if needed.  Avoid sitting or standing in one position for too long. Take breaks to get up, stretch, and walk around at least once every hour. Take breaks if you are driving for long periods of time.  Sleep on your side with your knees slightly bent, or sleep on your back with a pillow under your knees. Do not sleep on your stomach. LIFTING, TWISTING, AND REACHING  Avoid heavy lifting, especially repetitive lifting. If you must do heavy lifting:  Stretch before lifting.  Work slowly.  Rest between lifts.  Use carts and dollies to move objects when possible.  Make several small trips instead of carrying 1 heavy load.  Ask for help when you need it.  Ask for help when moving big, awkward objects.  Follow these steps when lifting:  Stand with your feet shoulder-width apart.  Get as close to the object as you can. Do not try to pick up heavy objects that are far from your body.  Use handles or lifting straps if they are available.  [employer]  at your knees. Squat down, but keep your heels off the floor.  Keep your shoulders pulled back, your chin tucked in, and your back straight.  Lift the object slowly, tightening the muscles in your legs, abdomen, and buttocks. Keep the object as close to the center of your body as possible.  When you put a load down, use these same guidelines in reverse.  Do not:  Lift the object above your waist.  Twist at the waist while lifting or carrying a load. Move your feet if you need to turn, not your waist.  Bend  over without bending at your knees.  Avoid reaching over your head, across a table, or for an object on a high surface. OTHER TIPS  Avoid wet floors and keep sidewalks clear of ice to prevent falls.  Do not sleep on a mattress that is too soft or too hard.  Keep items that are used frequently within easy reach.  Put heavier objects on shelves at waist level and lighter objects on lower or higher shelves.  Find ways to decrease your stress, such as exercise, massage, or relaxation techniques. Stress can build up in your muscles. Tense muscles are more vulnerable to injury.  Seek treatment for depression or anxiety if needed. These conditions can increase your risk of developing back pain. SEEK MEDICAL CARE IF:  You injure your back.  You have questions about diet, exercise, or other ways to prevent back injuries. MAKE SURE YOU:  Understand these instructions.  Will watch your condition.  Will get help right away if you are not doing well or get worse. Document Released: 07/03/2004 Document Revised: 08/18/2011 Document Reviewed: 07/07/2011 [facility] Patient Information 2015 [place], [place]. This information is not intended to replace advice given to you by your health care provider. Make sure you discuss any questions you have with your health care provider.

## 2014-04-13 ENCOUNTER — Emergency Department (HOSPITAL_BASED_OUTPATIENT_CLINIC_OR_DEPARTMENT_OTHER): Payer: Medicaid - Out of State

## 2014-04-13 ENCOUNTER — Encounter (HOSPITAL_BASED_OUTPATIENT_CLINIC_OR_DEPARTMENT_OTHER): Payer: Self-pay | Admitting: *Deleted

## 2014-04-13 DIAGNOSIS — S8992XA Unspecified injury of left lower leg, initial encounter: Secondary | ICD-10-CM | POA: Insufficient documentation

## 2014-04-13 DIAGNOSIS — Z7902 Long term (current) use of antithrombotics/antiplatelets: Secondary | ICD-10-CM | POA: Insufficient documentation

## 2014-04-13 DIAGNOSIS — Y92000 Kitchen of unspecified non-institutional (private) residence as  the place of occurrence of the external cause: Secondary | ICD-10-CM | POA: Insufficient documentation

## 2014-04-13 DIAGNOSIS — Z7951 Long term (current) use of inhaled steroids: Secondary | ICD-10-CM | POA: Insufficient documentation

## 2014-04-13 DIAGNOSIS — I10 Essential (primary) hypertension: Secondary | ICD-10-CM | POA: Insufficient documentation

## 2014-04-13 DIAGNOSIS — Z7982 Long term (current) use of aspirin: Secondary | ICD-10-CM | POA: Insufficient documentation

## 2014-04-13 DIAGNOSIS — Y93G9 Activity, other involving cooking and grilling: Secondary | ICD-10-CM | POA: Insufficient documentation

## 2014-04-13 DIAGNOSIS — Z9889 Other specified postprocedural states: Secondary | ICD-10-CM | POA: Insufficient documentation

## 2014-04-13 DIAGNOSIS — I2510 Atherosclerotic heart disease of native coronary artery without angina pectoris: Secondary | ICD-10-CM | POA: Insufficient documentation

## 2014-04-13 DIAGNOSIS — W010XXA Fall on same level from slipping, tripping and stumbling without subsequent striking against object, initial encounter: Secondary | ICD-10-CM | POA: Insufficient documentation

## 2014-04-13 DIAGNOSIS — E669 Obesity, unspecified: Secondary | ICD-10-CM | POA: Insufficient documentation

## 2014-04-13 DIAGNOSIS — Z720 Tobacco use: Secondary | ICD-10-CM | POA: Insufficient documentation

## 2014-04-13 DIAGNOSIS — Z79899 Other long term (current) drug therapy: Secondary | ICD-10-CM | POA: Insufficient documentation

## 2014-04-13 DIAGNOSIS — Z87448 Personal history of other diseases of urinary system: Secondary | ICD-10-CM | POA: Insufficient documentation

## 2014-04-13 DIAGNOSIS — G43909 Migraine, unspecified, not intractable, without status migrainosus: Secondary | ICD-10-CM | POA: Insufficient documentation

## 2014-04-13 DIAGNOSIS — J45909 Unspecified asthma, uncomplicated: Secondary | ICD-10-CM | POA: Insufficient documentation

## 2014-04-13 DIAGNOSIS — S93402A Sprain of unspecified ligament of left ankle, initial encounter: Secondary | ICD-10-CM | POA: Insufficient documentation

## 2014-04-13 DIAGNOSIS — F329 Major depressive disorder, single episode, unspecified: Secondary | ICD-10-CM | POA: Insufficient documentation

## 2014-04-13 DIAGNOSIS — K219 Gastro-esophageal reflux disease without esophagitis: Secondary | ICD-10-CM | POA: Insufficient documentation

## 2014-04-13 DIAGNOSIS — M1990 Unspecified osteoarthritis, unspecified site: Secondary | ICD-10-CM | POA: Insufficient documentation

## 2014-04-13 NOTE — ED Notes (Signed)
He slipped on grease tonight and twisted his left ankle and fell onto his left knee.

## 2014-04-14 ENCOUNTER — Emergency Department (HOSPITAL_BASED_OUTPATIENT_CLINIC_OR_DEPARTMENT_OTHER)
Admission: EM | Admit: 2014-04-14 | Discharge: 2014-04-14 | Disposition: A | Discharge status: Home / Self Care | Payer: Medicaid - Out of State | Attending: Emergency Medicine | Admitting: Emergency Medicine

## 2014-04-14 DIAGNOSIS — W19XXXA Unspecified fall, initial encounter: Secondary | ICD-10-CM

## 2014-04-14 DIAGNOSIS — S93402A Sprain of unspecified ligament of left ankle, initial encounter: Secondary | ICD-10-CM

## 2014-04-14 DIAGNOSIS — S8992XA Unspecified injury of left lower leg, initial encounter: Secondary | ICD-10-CM

## 2014-04-14 MED ORDER — OXYCODONE-ACETAMINOPHEN 5-325 MG PO TABS: 1.0000 | ORAL_TABLET | Freq: Four times a day (QID) | ORAL | Status: DC | PRN

## 2014-04-14 MED ORDER — HYDROCODONE-ACETAMINOPHEN 5-325 MG PO TABS
2.0000 | ORAL_TABLET | Freq: Once | ORAL | Status: AC
  Administered 2014-04-14: 2 via ORAL
  Filled 2014-04-14: qty 2

## 2014-04-14 NOTE — ED Provider Notes (Signed)
CSN: [id number]     Arrival date & time 04/13/14  2233 History   First MD Initiated Contact with Patient 04/14/14 0203     Chief Complaint  Patient presents with  . Fall     (Consider location/radiation/quality/duration/timing/severity/associated sxs/prior Treatment) HPI  This 43 year old male with a history of left knee surgery. He was cooking in the kitchen and slipped on grease just prior to arrival. He twisted his left ankle and fell onto his left knee. He is having moderate to severe pain in both. He states he is unable to bear weight due to the pain.pain is worse with weightbearing, movement or palpation. There is no associated deformity. He denies neck or back pain.  Past Medical History  Diagnosis Date  . Hypertension   . Obesity   . Arthritis   . Renal disorder   . Depression   . Migraine aura, persistent     since childhood  . Head ache   . Coronary artery disease   . GERD (gastroesophageal reflux disease)   . Asthma    Past Surgical History  Procedure Laterality Date  . Cardiac catheterization      x2  . Appendectomy     Family History  Problem Relation Age of Onset  . Hypertension Mother   . Hyperlipidemia Mother   . Hypertension Brother    History  Substance Use Topics  . Smoking status: Current Every Day Smoker  . Smokeless tobacco: Not on file  . Alcohol Use: No    Review of Systems  All other systems reviewed and are negative.   Allergies  Trazodone and nefazodone  Home Medications   Prior to Admission medications   Medication Sig Start Date End Date Taking? Authorizing Provider  albuterol (PROVENTIL HFA;VENTOLIN HFA) 108 (90 BASE) MCG/ACT inhaler Inhale 2 puffs into the lungs every 6 (six) hours as needed. For shortness of breath.     Historical Provider, MD  ALPRAZolam [name](XANAX) 1 MG tablet Take 1 mg by mouth 3 (three) times daily.    Historical Provider, MD  ALPRAZolam [name](XANAX) 1 MG tablet Take 1 tablet (1 mg total) by mouth 3 (three) times  daily as needed for anxiety. 02/18/14   Doug SouSam Jacubowitz, MD  aspirin 81 MG tablet Take 81 mg by mouth daily.    Historical Provider, MD  clopidogrel (PLAVIX) 75 MG tablet Take 75 mg by mouth daily.    Historical Provider, MD  fluticasone (FLONASE) 50 MCG/ACT nasal spray Place 2 sprays into both nostrils daily.    Historical Provider, MD  Fluticasone-Salmeterol (ADVAIR) 500-50 MCG/DOSE AEPB Inhale 2 puffs into the lungs every 12 (twelve) hours.      Historical Provider, MD  gabapentin (NEURONTIN) 800 MG tablet Take 800 mg by mouth 3 (three) times daily.    Historical Provider, MD  lisinopril (PRINIVIL,ZESTRIL) 10 MG tablet Take 10 mg by mouth daily.      Historical Provider, MD  metFORMIN (GLUCOPHAGE) 500 MG tablet Take by mouth 2 (two) times daily with a meal.    Historical Provider, MD  metoprolol tartrate (LOPRESSOR) 25 MG tablet Take 25 mg by mouth once.    Historical Provider, MD  omeprazole (PRILOSEC) 20 MG capsule Take 20 mg by mouth daily.    Historical Provider, MD  oxyCODONE-acetaminophen (PERCOCET) 5-325 MG per tablet Take 1-2 tablets by mouth every 6 (six) hours as needed for severe pain. 04/09/14   Doug SouSam Jacubowitz, MD  pravastatin (PRAVACHOL) 40 MG tablet Take 40 mg by mouth daily.  Historical Provider, MD   BP 159/98 mmHg  Pulse 85  Temp(Src) 98 F (36.7 C) (Oral)  Resp 20  Ht 6' (1.829 m)  Wt 330 lb (149.687 kg)  BMI 44.75 kg/m2  SpO2 99%   Physical Exam  General: Well-developed, obese male in no acute distress; appearance consistent with age of record HENT: normocephalic; atraumatic Eyes: pupils equal, round and reactive to light; extraocular muscles intact Neck: supple; nontender Heart: regular rate and rhythm Lungs: clear to auscultation bilaterally Abdomen: soft; obese; nontender Extremities: No deformity; pulses normal; trace edema of lower legs; tenderness of lower aspect of anterior left knee without gross instability, ecchymosis or appreciable swelling; tenderness  of lateral malleolus of left ankle without gross instability, ecchymosis or appreciable focal swelling Neurologic: Awake, alert and oriented; motor function intact in all extremities and symmetric; no facial droop Skin: Warm and dry Psychiatric: flat affect    ED Course  Procedures (including critical care time)   MDM  Nursing notes and vitals signs, including pulse oximetry, reviewed.  Summary of this visit's results, reviewed by myself:  Labs:  No results found for this or any previous visit (from the past 24 hour(s)).  Imaging Studies: Dg Ankle Complete Left  04/13/2014   CLINICAL DATA:  Larey SeatFell all in the kitchen.  Generalized ankle pain.  EXAM: LEFT ANKLE COMPLETE - 3+ VIEW  COMPARISON:  06/15/2011  FINDINGS: There is an old medial malleolar fracture with a small nonunited fragment. There are mild degenerative changes at the tibiotalar and subtalar joints. No evidence of acute fracture.  IMPRESSION: Old traumatic and degenerative changes as above. No acute injury identified.   Electronically Signed   By: Paulina FusiMark  Shogry M.D.   On: 04/13/2014 23:26   Dg Knee Complete 4 Views Left  04/13/2014   CLINICAL DATA:  Larey SeatFell in the kitchen.  Left knee and ankle pain.  EXAM: LEFT KNEE - COMPLETE 4+ VIEW  COMPARISON:  03/02/2011  FINDINGS: Tricompartment degenerative changes in the left knee with medial greater than lateral compartment narrowing and tricompartmental osteophytosis. Prominent osteophyte off of the superior patella. No significant effusion. No acute fracture or dislocation.  IMPRESSION: Degenerative changes in the left knee.  No acute bony abnormalities.   Electronically Signed   By: Burman NievesWilliam  Stevens M.D.   On: 04/13/2014 23:25   Patient has no local orthopedist. We will refer her to the orthopedist on call for follow-up.    Hanley SeamenJohn L Meaghann Choo, MD 04/14/14 [phone]

## 2014-08-09 ENCOUNTER — Emergency Department (HOSPITAL_BASED_OUTPATIENT_CLINIC_OR_DEPARTMENT_OTHER): Payer: Medicaid - Out of State

## 2014-08-09 ENCOUNTER — Emergency Department (HOSPITAL_BASED_OUTPATIENT_CLINIC_OR_DEPARTMENT_OTHER)
Admission: EM | Admit: 2014-08-09 | Discharge: 2014-08-10 | Disposition: A | Discharge status: Home / Self Care | Payer: Medicaid - Out of State | Attending: Emergency Medicine | Admitting: Emergency Medicine

## 2014-08-09 DIAGNOSIS — F329 Major depressive disorder, single episode, unspecified: Secondary | ICD-10-CM | POA: Insufficient documentation

## 2014-08-09 DIAGNOSIS — Z7951 Long term (current) use of inhaled steroids: Secondary | ICD-10-CM | POA: Insufficient documentation

## 2014-08-09 DIAGNOSIS — Z9889 Other specified postprocedural states: Secondary | ICD-10-CM | POA: Insufficient documentation

## 2014-08-09 DIAGNOSIS — I2510 Atherosclerotic heart disease of native coronary artery without angina pectoris: Secondary | ICD-10-CM | POA: Insufficient documentation

## 2014-08-09 DIAGNOSIS — G43909 Migraine, unspecified, not intractable, without status migrainosus: Secondary | ICD-10-CM | POA: Insufficient documentation

## 2014-08-09 DIAGNOSIS — I10 Essential (primary) hypertension: Secondary | ICD-10-CM | POA: Insufficient documentation

## 2014-08-09 DIAGNOSIS — Z7982 Long term (current) use of aspirin: Secondary | ICD-10-CM | POA: Insufficient documentation

## 2014-08-09 DIAGNOSIS — M1990 Unspecified osteoarthritis, unspecified site: Secondary | ICD-10-CM | POA: Insufficient documentation

## 2014-08-09 DIAGNOSIS — G479 Sleep disorder, unspecified: Secondary | ICD-10-CM | POA: Insufficient documentation

## 2014-08-09 DIAGNOSIS — N12 Tubulo-interstitial nephritis, not specified as acute or chronic: Secondary | ICD-10-CM | POA: Insufficient documentation

## 2014-08-09 DIAGNOSIS — Z720 Tobacco use: Secondary | ICD-10-CM | POA: Insufficient documentation

## 2014-08-09 DIAGNOSIS — J45909 Unspecified asthma, uncomplicated: Secondary | ICD-10-CM | POA: Insufficient documentation

## 2014-08-09 DIAGNOSIS — Z79899 Other long term (current) drug therapy: Secondary | ICD-10-CM | POA: Insufficient documentation

## 2014-08-09 DIAGNOSIS — Z7902 Long term (current) use of antithrombotics/antiplatelets: Secondary | ICD-10-CM | POA: Insufficient documentation

## 2014-08-09 DIAGNOSIS — K219 Gastro-esophageal reflux disease without esophagitis: Secondary | ICD-10-CM | POA: Insufficient documentation

## 2014-08-09 LAB — CBC WITH DIFFERENTIAL/PLATELET
Basophils Absolute: 0 10*3/uL (ref 0.0–0.1)
Basophils Relative: 0 % (ref 0–1)
Eosinophils Absolute: 0.2 10*3/uL (ref 0.0–0.7)
Eosinophils Relative: 2 % (ref 0–5)
HCT: 43.7 % (ref 39.0–52.0)
Hemoglobin: 14 g/dL (ref 13.0–17.0)
LYMPHS ABS: 1.6 10*3/uL (ref 0.7–4.0)
LYMPHS PCT: 14 % (ref 12–46)
MCH: 27.5 pg (ref 26.0–34.0)
MCHC: 32 g/dL (ref 30.0–36.0)
MCV: 85.7 fL (ref 78.0–100.0)
Monocytes Absolute: 1.2 10*3/uL — ABNORMAL HIGH (ref 0.1–1.0)
Monocytes Relative: 10 % (ref 3–12)
NEUTROS PCT: 74 % (ref 43–77)
Neutro Abs: 8.5 10*3/uL — ABNORMAL HIGH (ref 1.7–7.7)
PLATELETS: 271 10*3/uL (ref 150–400)
RBC: 5.1 MIL/uL (ref 4.22–5.81)
RDW: 14.8 % (ref 11.5–15.5)
WBC: 11.5 10*3/uL — AB (ref 4.0–10.5)

## 2014-08-09 LAB — COMPREHENSIVE METABOLIC PANEL
ALT: 24 U/L (ref 0–53)
ANION GAP: 2 — AB (ref 5–15)
AST: 21 U/L (ref 0–37)
Albumin: 3.5 g/dL (ref 3.5–5.2)
Alkaline Phosphatase: 71 U/L (ref 39–117)
BUN: 19 mg/dL (ref 6–23)
CO2: 26 mmol/L (ref 19–32)
CREATININE: 1.07 mg/dL (ref 0.50–1.35)
Calcium: 8.1 mg/dL — ABNORMAL LOW (ref 8.4–10.5)
Chloride: 108 mmol/L (ref 96–112)
GFR calc Af Amer: >90 mL/min (ref >90)
GFR calc non Af Amer: 84 mL/min — ABNORMAL LOW (ref >90)
Glucose, Bld: 146 mg/dL — ABNORMAL HIGH (ref 70–99)
Potassium: 3.6 mmol/L (ref 3.5–5.1)
SODIUM: 136 mmol/L (ref 135–145)
TOTAL PROTEIN: 6.9 g/dL (ref 6.0–8.3)
Total Bilirubin: 0.3 mg/dL (ref 0.3–1.2)

## 2014-08-09 LAB — URINE MICROSCOPIC-ADD ON

## 2014-08-09 LAB — URINALYSIS, ROUTINE W REFLEX MICROSCOPIC
Bilirubin Urine: NEGATIVE
Glucose, UA: NEGATIVE mg/dL
Ketones, ur: 15 mg/dL — AB
NITRITE: NEGATIVE
PROTEIN: 30 mg/dL — AB
Specific Gravity, Urine: 1.029 (ref 1.005–1.030)
UROBILINOGEN UA: 1 mg/dL (ref 0.0–1.0)
pH: 5.5 (ref 5.0–8.0)

## 2014-08-09 MED ORDER — DEXTROSE 5 % IV SOLN
1.0000 g | Freq: Once | INTRAVENOUS | Status: AC
  Administered 2014-08-09 – 2014-08-10 (×2): 1 g via INTRAVENOUS

## 2014-08-09 MED ORDER — HYDROMORPHONE HCL 1 MG/ML IJ SOLN
1.0000 mg | Freq: Once | INTRAMUSCULAR | Status: AC
  Administered 2014-08-09: 1 mg via INTRAVENOUS
  Filled 2014-08-09: qty 1

## 2014-08-09 MED ORDER — SODIUM CHLORIDE 0.9 % IV BOLUS (SEPSIS)
1000.0000 mL | Freq: Once | INTRAVENOUS | Status: AC
  Administered 2014-08-09: 1000 mL via INTRAVENOUS

## 2014-08-09 MED ORDER — CEPHALEXIN 500 MG PO CAPS: 500.0000 mg | ORAL_CAPSULE | Freq: Four times a day (QID) | ORAL | Status: DC

## 2014-08-09 MED ORDER — ONDANSETRON HCL 4 MG/2ML IJ SOLN
4.0000 mg | Freq: Once | INTRAMUSCULAR | Status: AC
  Administered 2014-08-09: 4 mg via INTRAVENOUS
  Filled 2014-08-09: qty 2

## 2014-08-09 MED ORDER — OXYCODONE-ACETAMINOPHEN 5-325 MG PO TABS: 1.0000 | ORAL_TABLET | ORAL | Status: DC | PRN

## 2014-08-09 MED ORDER — ALBUTEROL SULFATE HFA 108 (90 BASE) MCG/ACT IN AERS
2.0000 | INHALATION_SPRAY | RESPIRATORY_TRACT | Status: DC | PRN
Start: 2014-08-09 — End: 2015-05-16

## 2014-08-09 MED ORDER — CEFTRIAXONE SODIUM 1 G IJ SOLR
INTRAMUSCULAR | Status: AC
  Filled 2014-08-09: qty 10

## 2014-08-09 MED ORDER — ONDANSETRON 4 MG PO TBDP: ORAL_TABLET | ORAL | Status: DC

## 2014-08-09 NOTE — ED Notes (Signed)
Pt sts hematuria with severe lower back pain starting 4 days ago; pt sts history of multiple kidney stones and remission from kidney cancer x2years. States he has passed one kidney stone, but thinks there are more based on continued bleeding. Today pain is intolerable, vomiting secondary to pain.

## 2014-08-09 NOTE — ED Provider Notes (Signed)
CSN: [id number]     Arrival date & time 08/09/14  2019 History  This chart was [id number] for Audree CamelScott T Kaisley Stiverson, MD by Haywood PaoNadim Abu Hashem, ED Scribe. The patient was seen in [facility]02/[facility] and the patient's care was started at 10:17 PM.   Chief Complaint  Patient presents with  . Hematuria   HPI HPI Comments: Richard Anderson is a 44 y.o. male who presents to the Emergency Department complaining of right sided flank pain onset 4 days ago. Pain is gradually worsening rated 9/10 and radiates down to his groin. He has vomiting, nausea and loss of sleep due to the pain, dysuria and hematuria. Pt reports the dysuria is atypical, he has not had dysuria associated with his kidney stones in the past. Pt states he passed one kidney stone 3 days ago but the pain has continued. Has a Hx of kidney stones, has [place]. He is has a cough but this is unrelated to his current complaint. He denies fever and chills.  Past Medical History  Diagnosis Date  . Hypertension   . Obesity   . Arthritis   . Renal disorder   . Depression   . Migraine aura, persistent     since childhood  . Head ache   . Coronary artery disease   . GERD (gastroesophageal reflux disease)   . Asthma    Past Surgical History  Procedure Laterality Date  . Cardiac catheterization      x2  . Appendectomy     Family History  Problem Relation Age of Onset  . Hypertension Mother   . Hyperlipidemia Mother   . Hypertension Brother    History  Substance Use Topics  . Smoking status: Current Every Day Smoker  . Smokeless tobacco: Not on file  . Alcohol Use: No    Review of Systems  Respiratory: Positive for cough.   Gastrointestinal: Positive for nausea and vomiting. Negative for abdominal pain.  Genitourinary: Positive for dysuria, hematuria and flank pain.  Psychiatric/Behavioral: Positive for sleep disturbance.  All other systems reviewed and are negative.   Allergies  Trazodone and  nefazodone  Home Medications   Prior to Admission medications   Medication Sig Start Date End Date Taking? Authorizing Provider  albuterol (PROVENTIL HFA;VENTOLIN HFA) 108 (90 BASE) MCG/ACT inhaler Inhale 2 puffs into the lungs every 6 (six) hours as needed. For shortness of breath.     Historical Provider, MD  ALPRAZolam [name](XANAX) 1 MG tablet Take 1 mg by mouth 3 (three) times daily.    Historical Provider, MD  ALPRAZolam [name](XANAX) 1 MG tablet Take 1 tablet (1 mg total) by mouth 3 (three) times daily as needed for anxiety. 02/18/14   Doug SouSam Jacubowitz, MD  aspirin 81 MG tablet Take 81 mg by mouth daily.    Historical Provider, MD  clopidogrel (PLAVIX) 75 MG tablet Take 75 mg by mouth daily.    Historical Provider, MD  fluticasone (FLONASE) 50 MCG/ACT nasal spray Place 2 sprays into both nostrils daily.    Historical Provider, MD  Fluticasone-Salmeterol (ADVAIR) 500-50 MCG/DOSE AEPB Inhale 2 puffs into the lungs every 12 (twelve) hours.      Historical Provider, MD  gabapentin (NEURONTIN) 800 MG tablet Take 800 mg by mouth 3 (three) times daily.    Historical Provider, MD  lisinopril (PRINIVIL,ZESTRIL) 10 MG tablet Take 10 mg by mouth daily.      Historical Provider, MD  metFORMIN (GLUCOPHAGE) 500 MG tablet Take by mouth  2 (two) times daily with a meal.    Historical Provider, MD  metoprolol tartrate (LOPRESSOR) 25 MG tablet Take 25 mg by mouth once.    Historical Provider, MD  omeprazole (PRILOSEC) 20 MG capsule Take 20 mg by mouth daily.    Historical Provider, MD  oxyCODONE-acetaminophen (PERCOCET) 5-325 MG per tablet Take 1-2 tablets by mouth every 6 (six) hours as needed (for pain). 04/14/14   John L Molpus, MD  pravastatin (PRAVACHOL) 40 MG tablet Take 40 mg by mouth daily.    Historical Provider, MD   BP 153/106 mmHg  Pulse 90  Temp(Src) 98.1 F (36.7 C) (Oral)  Resp 20  Ht 6' (1.829 m)  Wt 315 lb (142.883 kg)  BMI 42.71 kg/m2  SpO2 99% Physical Exam  Constitutional: He is oriented to  person, place, and time. He appears well-developed and well-nourished. No distress.  morbidly obese  HENT:  Head: Normocephalic and atraumatic.  Right Ear: External ear normal.  Left Ear: External ear normal.  Nose: Nose normal.  Eyes: Right eye exhibits no discharge. Left eye exhibits no discharge.  Cardiovascular: Normal rate, regular rhythm and normal heart sounds.   Pulmonary/Chest: Effort normal and breath sounds normal. No respiratory distress.  Abdominal: Soft. There is no tenderness.  Right CVA and flank tenderness. No abdominal tenderness.  Neurological: He is alert and oriented to person, place, and time.  Skin: Skin is warm and dry.  Psychiatric: He has a normal mood and affect. His behavior is normal.  Nursing note and vitals reviewed.   ED Course  Procedures  DIAGNOSTIC STUDIES: Oxygen Saturation is 99% on room air, normal by my interpretation.    COORDINATION OF CARE: 10:23 PM Discussed treatment plan with pt at bedside and pt agreed to plan.  Labs Review Labs Reviewed  URINALYSIS, ROUTINE W REFLEX MICROSCOPIC - Abnormal; Notable for the following:    Color, Urine AMBER (*)    APPearance TURBID (*)    Hgb urine dipstick LARGE (*)    Ketones, ur 15 (*)    Protein, ur 30 (*)    Leukocytes, UA MODERATE (*)    All other components within normal limits  URINE MICROSCOPIC-ADD ON - Abnormal; Notable for the following:    Squamous Epithelial / LPF FEW (*)    Bacteria, UA MANY (*)    All other components within normal limits  CBC WITH DIFFERENTIAL/PLATELET - Abnormal; Notable for the following:    WBC 11.5 (*)    Neutro Abs 8.5 (*)    Monocytes Absolute 1.2 (*)    All other components within normal limits  COMPREHENSIVE METABOLIC PANEL - Abnormal; Notable for the following:    Glucose, Bld 146 (*)    Calcium 8.1 (*)    GFR calc non Af Amer 84 (*)    Anion gap 2 (*)    All other components within normal limits  URINE CULTURE    Imaging Review Ct Renal Stone  Study  08/09/2014   CLINICAL DATA:  Right flank pain for 4 days with nausea and vomiting. Hematuria.  EXAM: CT ABDOMEN AND PELVIS WITHOUT CONTRAST  TECHNIQUE: Multidetector CT imaging of the abdomen and pelvis was performed following the standard protocol without IV contrast.  COMPARISON:  03/24/2014  FINDINGS: BODY WALL: Unremarkable.  LOWER CHEST: Unremarkable.  ABDOMEN/PELVIS:  Liver: Hepatic steatosis  Biliary: No evidence of biliary obstruction or stone.  Pancreas: Unremarkable.  Spleen: Unremarkable.  Adrenals: Unremarkable.  Kidneys and ureters: No hydronephrosis or stone.  Bladder: Unremarkable.  Reproductive: Prominent dystrophic calcifications in the prostate, stable from prior.  Bowel: No obstruction. Appendectomy.  Retroperitoneum: No mass or adenopathy.  Peritoneum: No ascites or pneumoperitoneum.  Vascular: Scattered atherosclerotic calcification.  OSSEOUS: No acute abnormalities.  IMPRESSION: 1. No hydronephrosis or urolithiasis. 2. Hepatic steatosis.   Electronically Signed   By: Marnee Spring M.D.   On: 08/09/2014 23:24     EKG Interpretation None      MDM   Final diagnoses:  Pyelonephritis    Patient's CT shows no acute abnormalities, including no ureteral stones. Urine is positive for UTI, and with flank pain it is more likely pyelo. No vomiting in ED. Pain controlled with IV narcotics. Given pain is better and labs show only mild elevated WBC I feel he is stable for outpatient treatment with antibiotics. He is requesting albuterol refill, has mild cough but no dyspnea or significant wheezing.   I personally performed the services described in this documentation, which was scribed in my presence. The recorded information has been reviewed and is accurate.   Audree Camel, MD 08/09/14 [phone]

## 2014-08-09 NOTE — Discharge Instructions (Signed)
Pyelonephritis, Adult °Pyelonephritis is a kidney infection. In general, there are 2 main types of pyelonephritis: °· Infections that come on quickly without any warning (acute pyelonephritis). °· Infections that persist for a long period of time (chronic pyelonephritis). °CAUSES  °Two main causes of pyelonephritis are: °· Bacteria traveling from the bladder to the kidney. This is a problem especially in pregnant women. The urine in the bladder can become filled with bacteria from multiple causes, including: °· Inflammation of the prostate gland (prostatitis). °· Sexual intercourse in females. °· Bladder infection (cystitis). °· Bacteria traveling from the bloodstream to the tissue part of the kidney. °Problems that may increase your risk of getting a kidney infection include: °· Diabetes. °· Kidney stones or bladder stones. °· Cancer. °· Catheters placed in the bladder. °· Other abnormalities of the kidney or ureter. °SYMPTOMS  °· Abdominal pain. °· Pain in the side or flank area. °· Fever. °· Chills. °· Upset stomach. °· Blood in the urine (dark urine). °· Frequent urination. °· Strong or persistent urge to urinate. °· Burning or stinging when urinating. °DIAGNOSIS  °Your caregiver may diagnose your kidney infection based on your symptoms. A urine sample may also be taken. °TREATMENT  °In general, treatment depends on how severe the infection is.  °· If the infection is mild and caught early, your caregiver may treat you with oral antibiotics and send you home. °· If the infection is more severe, the bacteria may have gotten into the bloodstream. This will require intravenous (IV) antibiotics and a hospital stay. Symptoms may include: °¨ High fever. °¨ Severe flank pain. °¨ Shaking chills. °· Even after a hospital stay, your caregiver may require you to be on oral antibiotics for a period of time. °· Other treatments may be required depending upon the cause of the infection. °HOME CARE INSTRUCTIONS  °· Take your  antibiotics as directed. Finish them even if you start to feel better. °· Make an appointment to have your urine checked to make sure the infection is gone. °· Drink enough fluids to keep your urine clear or pale yellow. °· Take medicines for the bladder if you have urgency and frequency of urination as directed by your caregiver. °SEEK IMMEDIATE MEDICAL CARE IF:  °· You have a fever or persistent symptoms for more than 2-3 days. °· You have a fever and your symptoms suddenly get worse. °· You are unable to take your antibiotics or fluids. °· You develop shaking chills. °· You experience extreme weakness or fainting. °· There is no improvement after 2 days of treatment. °MAKE SURE YOU: °· Understand these instructions. °· Will watch your condition. °· Will get help right away if you are not doing well or get worse. °Document Released: 05/26/2005 Document Revised: 11/25/2011 Document Reviewed: 10/30/2010 °ExitCare® Patient Information ©2015 ExitCare, LLC. This information is not intended to replace advice given to you by your health care provider. Make sure you discuss any questions you have with your health care provider. ° °Urinary Tract Infection °Urinary tract infections (UTIs) can develop anywhere along your urinary tract. Your urinary tract is your body's drainage system for removing wastes and extra water. Your urinary tract includes two kidneys, two ureters, a bladder, and a urethra. Your kidneys are a pair of bean-shaped organs. Each kidney is about the size of your fist. They are located below your ribs, one on each side of your spine. °CAUSES °Infections are caused by microbes, which are microscopic organisms, including fungi, viruses, and bacteria. These   organisms are so small that they can only be seen through a microscope. Bacteria are the microbes that most commonly cause UTIs. °SYMPTOMS  °Symptoms of UTIs may vary by age and gender of the patient and by the location of the infection. Symptoms in young  women typically include a frequent and intense urge to urinate and a painful, burning feeling in the bladder or urethra during urination. Older women and men are more likely to be tired, shaky, and weak and have muscle aches and abdominal pain. A fever may mean the infection is in your kidneys. Other symptoms of a kidney infection include pain in your back or sides below the ribs, nausea, and vomiting. °DIAGNOSIS °To diagnose a UTI, your caregiver will ask you about your symptoms. Your caregiver also will ask to provide a urine sample. The urine sample will be tested for bacteria and white blood cells. White blood cells are made by your body to help fight infection. °TREATMENT  °Typically, UTIs can be treated with medication. Because most UTIs are caused by a bacterial infection, they usually can be treated with the use of antibiotics. The choice of antibiotic and length of treatment depend on your symptoms and the type of bacteria causing your infection. °HOME CARE INSTRUCTIONS °· If you were prescribed antibiotics, take them exactly as your caregiver instructs you. Finish the medication even if you feel better after you have only taken some of the medication. °· Drink enough water and fluids to keep your urine clear or pale yellow. °· Avoid caffeine, tea, and carbonated beverages. They tend to irritate your bladder. °· Empty your bladder often. Avoid holding urine for long periods of time. °· Empty your bladder before and after sexual intercourse. °· After a bowel movement, women should cleanse from front to back. Use each tissue only once. °SEEK MEDICAL CARE IF:  °· You have back pain. °· You develop a fever. °· Your symptoms do not begin to resolve within 3 days. °SEEK IMMEDIATE MEDICAL CARE IF:  °· You have severe back pain or lower abdominal pain. °· You develop chills. °· You have nausea or vomiting. °· You have continued burning or discomfort with urination. °MAKE SURE YOU:  °· Understand these  instructions. °· Will watch your condition. °· Will get help right away if you are not doing well or get worse. °Document Released: 03/05/2005 Document Revised: 11/25/2011 Document Reviewed: 07/04/2011 °ExitCare® Patient Information ©2015 ExitCare, LLC. This information is not intended to replace advice given to you by your health care provider. Make sure you discuss any questions you have with your health care provider. ° °

## 2014-08-09 NOTE — ED Notes (Signed)
Rt flank, blood in urine n/v x 3 days    States passed stone 3 days ago

## 2014-08-11 LAB — URINE CULTURE: Colony Count: >=100000

## 2014-10-20 ENCOUNTER — Emergency Department (HOSPITAL_COMMUNITY)
Admission: EM | Admit: 2014-10-20 | Discharge: 2014-10-20 | Disposition: A | Discharge status: Home / Self Care | Payer: Self-pay | Attending: Emergency Medicine | Admitting: Emergency Medicine

## 2014-10-20 ENCOUNTER — Emergency Department (HOSPITAL_COMMUNITY): Payer: Medicaid - Out of State

## 2014-10-20 ENCOUNTER — Encounter (HOSPITAL_COMMUNITY): Payer: Self-pay | Admitting: Family Medicine

## 2014-10-20 DIAGNOSIS — J45909 Unspecified asthma, uncomplicated: Secondary | ICD-10-CM | POA: Insufficient documentation

## 2014-10-20 DIAGNOSIS — Z8679 Personal history of other diseases of the circulatory system: Secondary | ICD-10-CM

## 2014-10-20 DIAGNOSIS — A5909 Other urogenital trichomoniasis: Secondary | ICD-10-CM | POA: Insufficient documentation

## 2014-10-20 DIAGNOSIS — I10 Essential (primary) hypertension: Secondary | ICD-10-CM | POA: Insufficient documentation

## 2014-10-20 DIAGNOSIS — G43909 Migraine, unspecified, not intractable, without status migrainosus: Secondary | ICD-10-CM | POA: Insufficient documentation

## 2014-10-20 DIAGNOSIS — Z720 Tobacco use: Secondary | ICD-10-CM | POA: Insufficient documentation

## 2014-10-20 DIAGNOSIS — M545 Low back pain: Secondary | ICD-10-CM | POA: Insufficient documentation

## 2014-10-20 DIAGNOSIS — Z7902 Long term (current) use of antithrombotics/antiplatelets: Secondary | ICD-10-CM | POA: Insufficient documentation

## 2014-10-20 DIAGNOSIS — Z79899 Other long term (current) drug therapy: Secondary | ICD-10-CM | POA: Insufficient documentation

## 2014-10-20 DIAGNOSIS — K219 Gastro-esophageal reflux disease without esophagitis: Secondary | ICD-10-CM | POA: Insufficient documentation

## 2014-10-20 DIAGNOSIS — I2510 Atherosclerotic heart disease of native coronary artery without angina pectoris: Secondary | ICD-10-CM | POA: Insufficient documentation

## 2014-10-20 DIAGNOSIS — R197 Diarrhea, unspecified: Secondary | ICD-10-CM | POA: Insufficient documentation

## 2014-10-20 DIAGNOSIS — Z7982 Long term (current) use of aspirin: Secondary | ICD-10-CM | POA: Insufficient documentation

## 2014-10-20 DIAGNOSIS — Z7951 Long term (current) use of inhaled steroids: Secondary | ICD-10-CM | POA: Insufficient documentation

## 2014-10-20 DIAGNOSIS — A599 Trichomoniasis, unspecified: Secondary | ICD-10-CM

## 2014-10-20 DIAGNOSIS — M1990 Unspecified osteoarthritis, unspecified site: Secondary | ICD-10-CM | POA: Insufficient documentation

## 2014-10-20 DIAGNOSIS — N12 Tubulo-interstitial nephritis, not specified as acute or chronic: Secondary | ICD-10-CM | POA: Insufficient documentation

## 2014-10-20 DIAGNOSIS — R509 Fever, unspecified: Secondary | ICD-10-CM | POA: Insufficient documentation

## 2014-10-20 DIAGNOSIS — Z9889 Other specified postprocedural states: Secondary | ICD-10-CM | POA: Insufficient documentation

## 2014-10-20 DIAGNOSIS — R112 Nausea with vomiting, unspecified: Secondary | ICD-10-CM | POA: Insufficient documentation

## 2014-10-20 DIAGNOSIS — Z760 Encounter for issue of repeat prescription: Secondary | ICD-10-CM | POA: Insufficient documentation

## 2014-10-20 DIAGNOSIS — R5383 Other fatigue: Secondary | ICD-10-CM | POA: Insufficient documentation

## 2014-10-20 LAB — CBC WITH DIFFERENTIAL/PLATELET
Basophils Absolute: 0 10*3/uL (ref 0.0–0.1)
Basophils Relative: 0 % (ref 0–1)
Eosinophils Absolute: 0.1 10*3/uL (ref 0.0–0.7)
Eosinophils Relative: 1 % (ref 0–5)
HCT: 43.3 % (ref 39.0–52.0)
Hemoglobin: 14.1 g/dL (ref 13.0–17.0)
Lymphocytes Relative: 17 % (ref 12–46)
Lymphs Abs: 1.7 10*3/uL (ref 0.7–4.0)
MCH: 27.6 pg (ref 26.0–34.0)
MCHC: 32.6 g/dL (ref 30.0–36.0)
MCV: 84.9 fL (ref 78.0–100.0)
Monocytes Absolute: 0.8 10*3/uL (ref 0.1–1.0)
Monocytes Relative: 8 % (ref 3–12)
Neutro Abs: 7.4 10*3/uL (ref 1.7–7.7)
Neutrophils Relative %: 74 % (ref 43–77)
Platelets: 275 10*3/uL (ref 150–400)
RBC: 5.1 MIL/uL (ref 4.22–5.81)
RDW: 14.4 % (ref 11.5–15.5)
WBC: 10 10*3/uL (ref 4.0–10.5)

## 2014-10-20 LAB — COMPREHENSIVE METABOLIC PANEL
ALT: 30 U/L (ref 17–63)
AST: 20 U/L (ref 15–41)
Albumin: 3.4 g/dL — ABNORMAL LOW (ref 3.5–5.0)
Alkaline Phosphatase: 58 U/L (ref 38–126)
Anion gap: 10 (ref 5–15)
BUN: 10 mg/dL (ref 6–20)
CO2: 23 mmol/L (ref 22–32)
Calcium: 8.9 mg/dL (ref 8.9–10.3)
Chloride: 107 mmol/L (ref 101–111)
Creatinine, Ser: 0.9 mg/dL (ref 0.61–1.24)
GFR calc Af Amer: >60 mL/min (ref >60)
GFR calc non Af Amer: >60 mL/min (ref >60)
Glucose, Bld: 92 mg/dL (ref 65–99)
Potassium: 4 mmol/L (ref 3.5–5.1)
Sodium: 140 mmol/L (ref 135–145)
Total Bilirubin: 0.5 mg/dL (ref 0.3–1.2)
Total Protein: 6.8 g/dL (ref 6.5–8.1)

## 2014-10-20 LAB — URINE MICROSCOPIC-ADD ON

## 2014-10-20 LAB — LIPASE, BLOOD: Lipase: 89 U/L — ABNORMAL HIGH (ref 22–51)

## 2014-10-20 LAB — URINALYSIS, ROUTINE W REFLEX MICROSCOPIC
BILIRUBIN URINE: NEGATIVE
Glucose, UA: NEGATIVE mg/dL
Ketones, ur: NEGATIVE mg/dL
NITRITE: POSITIVE — AB
Protein, ur: 30 mg/dL — AB
Specific Gravity, Urine: 1.016 (ref 1.005–1.030)
UROBILINOGEN UA: 0.2 mg/dL (ref 0.0–1.0)
pH: 6 (ref 5.0–8.0)

## 2014-10-20 MED ORDER — LISINOPRIL 10 MG PO TABS: 10.0000 mg | ORAL_TABLET | Freq: Every day | ORAL | Status: DC

## 2014-10-20 MED ORDER — SODIUM CHLORIDE 0.9 % IV BOLUS (SEPSIS)
1000.0000 mL | Freq: Once | INTRAVENOUS | Status: AC
  Administered 2014-10-20: 1000 mL via INTRAVENOUS

## 2014-10-20 MED ORDER — METOPROLOL TARTRATE 25 MG PO TABS: 25.0000 mg | ORAL_TABLET | Freq: Once | ORAL | Status: DC

## 2014-10-20 MED ORDER — HYDROMORPHONE HCL 1 MG/ML IJ SOLN
1.0000 mg | Freq: Once | INTRAMUSCULAR | Status: AC
  Administered 2014-10-20: 1 mg via INTRAVENOUS
  Filled 2014-10-20: qty 1

## 2014-10-20 MED ORDER — METRONIDAZOLE 500 MG PO TABS
2000.0000 mg | ORAL_TABLET | Freq: Once | ORAL | Status: AC
  Administered 2014-10-20: 2000 mg via ORAL
  Filled 2014-10-20: qty 4

## 2014-10-20 MED ORDER — PRAVASTATIN SODIUM 40 MG PO TABS: 40.0000 mg | ORAL_TABLET | Freq: Every day | ORAL | Status: DC

## 2014-10-20 MED ORDER — ONDANSETRON HCL 4 MG/2ML IJ SOLN
4.0000 mg | Freq: Once | INTRAMUSCULAR | Status: AC
  Administered 2014-10-20: 4 mg via INTRAVENOUS
  Filled 2014-10-20: qty 2

## 2014-10-20 MED ORDER — OXYCODONE-ACETAMINOPHEN 5-325 MG PO TABS: 1.0000 | ORAL_TABLET | ORAL | Status: DC | PRN

## 2014-10-20 MED ORDER — AZITHROMYCIN 250 MG PO TABS
1000.0000 mg | ORAL_TABLET | Freq: Once | ORAL | Status: AC
  Administered 2014-10-20: 1000 mg via ORAL
  Filled 2014-10-20: qty 4

## 2014-10-20 MED ORDER — KETOROLAC TROMETHAMINE 30 MG/ML IJ SOLN
30.0000 mg | Freq: Once | INTRAMUSCULAR | Status: AC
  Administered 2014-10-20: 30 mg via INTRAVENOUS
  Filled 2014-10-20: qty 1

## 2014-10-20 MED ORDER — ONDANSETRON 4 MG PO TBDP: ORAL_TABLET | ORAL | Status: DC

## 2014-10-20 MED ORDER — DEXTROSE 5 % IV SOLN
1.0000 g | Freq: Once | INTRAVENOUS | Status: AC
  Administered 2014-10-20: 1 g via INTRAVENOUS
  Filled 2014-10-20: qty 10

## 2014-10-20 MED ORDER — CEPHALEXIN 500 MG PO CAPS: 500.0000 mg | ORAL_CAPSULE | Freq: Four times a day (QID) | ORAL | Status: DC

## 2014-10-20 NOTE — ED Notes (Signed)
US called.  They are coming to pick up pt.

## 2014-10-20 NOTE — Progress Notes (Signed)
Dear ___________Williams Francine GravenL Vinsant ______________:  Bonita QuinYou have been approved to have the prescriptions written by your discharging physician filled through our [facility] ([facility]ication Assistance [facility]) program. This program allows for a one-time (no refills) 34-day supply of selected medications for a low copay amount.  The copay is $3.00 per prescription. For instance, if you have one prescription, you will pay $3.00; for two prescriptions, you pay $6.00; for three prescriptions, you pay $9.00; and so on.  Only certain pharmacies are participating in this program with [facility]. You will need to select one of the pharmacies from the attached list and take your prescriptions, this letter, and your photo ID to one of the participating pharmacies.   We are excited that you are able to use the [facility] program to get your medications. These prescriptions must be filled within 7 days of hospital discharge or they will no longer be valid for the [facility] program. Should you have any problems with your prescriptions please contact your case management team member at [phone].  Thank you,   [place] [facility]ATCH PHARMAC[facility]   [facility], [address], [place], [place] [facility], [address], [place], [place] [facility] [address], [place], [place] [facility],[address], [address], [place], [place] [facility], [address], [place], [place] [address] [facility], [address], [place], [place] [facility], [address], [address], [place], [place]   [facility] [address], [place], [place]   [id number] [address], [place], [place]  [address] [place], [place], [place]   [address]10 [address], [place], [place] [address], [place], [place]   [address], [place],  [place] [address], [place], [place]    1040 [address], [place], [place] [address], [place], [place]   [id number] [address], [place], [place] [id number] [address], [place], [place]   [address]  [employer]         1624 [place] [address], [place], [place]     [id number], [place], [place] [id number] [address], [place], [place]                                 [address], [place]  [address], [place], [place]                                  [address], [place] [address], [place], [place]                                                           [address], [place] [id number] [address], [place], [place]                              [address], [place] [address], [place], [place]  [place], [place], [place], [place] [address], [place], [place], [place], [place] [employer], [place], [address] [place], [place] [address], [place], [place]                                       [place], [place], [place] [employer], [place], [place]                                           [place], [place], [place], [place], [place], [place], [place], [place], [place], [place], [place], [place], [place]

## 2014-10-20 NOTE — Progress Notes (Addendum)
ED CM (covering) spoke with Denny PeonErin, ED PA/NP about pt & CM consult.  C/o lower back pain, hematuria, flank pain. Pt [place], needs pcp  1508 ED CM called [facility] and had her to take a phone in to pt CM assessed pt.  He states he does not qualify for [place] med[place]. He reports when he tried get [facility] [address] [facility] [place] he was informed by the pharmacy staff there was no coverage for [place] [employer].  Pt confirms he has been in [place] for "six and a half months"  Pt states he was told he did "not qualify for [place] medicaid" but when CM asked he confirmed [place] has not been to [place]. Cm informed him that [place] DSS will be placed in d/c instructions  Cm spoke with Felicia of [facility] and provided referral  Pt unable to provide list of medications Noted list in EPIC. CM reviewed Goodrx, [facility] as  resources for pt   1533 CM spoke with Steward DroneBrenda, Charity fundraiserN and pt to inform them of MATCH letter, [facility] referral and list of [place] self pay resources faxed to [phone]   CM reviewed EPIC notes and chart review information CM spoke with the pt about [facility]ATCH program ($3 co pay for each Rx through [facility], does not include refills, 7 day expiration of MATCH letter and choice of pharmacies) Pt agreed to receive assistance from program    Pt is eligible for [facility]ATCH program (unable to find pt listed in PDMI per cardholder name inquiry)  PDMI information entered. MATCH letter completed and faxed to pt.   CM updated ED RN   CM spoke with pt who confirms self [place] no pcp. CM discussed and provided written information for self pay pcps, importance of pcp for f/u care, www.needymeds.org, discounted pharmacies and other [place] resources such as financial assistance, DSS and  health department  Reviewed resources for [place] self pay pcps like Jovita KussmaulEvans  [place], family medicine at [address], [facility] family practice, general medical clinics, [facility] urgent care plus others, [facility] out patient pharmacies, housing, and other resources in [place]. Pt voiced understanding and appreciation of resources provided

## 2014-10-20 NOTE — ED Provider Notes (Signed)
44 year old morbidly obese male, history of coronary disease, presents with ongoing lower back pain and hematuria which has been present for several weeks but gradually worsening and most recently has become severe. He denies associated fevers or chills, was seen approximately 2 months ago at which time his urinalysis was consistent with a possible pyelonephritis, urine culture never grew a specific bacteria but was polymicrobial and thus not tested for sensitivities. On exam the patient is morbidly obese, has a soft essentially nontender abdomen, right CVA tenderness, heart and lungs appear normal, he is not in distress. Will recheck urinalysis, urine culture, basic labs, hydrate, treat as indicated based on lab results.  Medical screening examination/treatment/procedure(s) were conducted as a shared visit with non-physician practitioner(s) and myself.  I personally evaluated the patient during the encounter.  Clinical Impression:   Final diagnoses:  Pyelonephritis  Trichimoniasis  Medication refill  History of hypertension         Eber HongBrian Allaina Brotzman, MD 10/22/14 [phone]

## 2014-10-20 NOTE — ED Provider Notes (Signed)
CSN: [id number]     Arrival date & time 10/20/14  1317 History   First MD Initiated Contact with Patient 10/20/14 1327     Chief Complaint  Patient presents with  . Back Pain  . Hematuria     (Consider location/radiation/quality/duration/timing/severity/associated sxs/prior Treatment) HPI  Pt is a 44yo morbidly obese male with hx of HTN, renal disorder, depression, persistent migraine since childhood, CAD, GERD, and asthma, presenting to ED with c/o persistent diffuse abdominal pain, bilateral flank pain, hematuria, and dysuria with urinary frequency.  Pt was seen at [facility] on 08/09/14, dx with pyelonephritis, was tx with keflex.  Urine culture came back with multiple bacterial morphologies present. Pt had a CT scan at that time which did not show any hydronephrosis or renal stones.  Pt reports completing his antibiotics but no relief. States he moved from [place] 35mo ago and still does not have a insurance so he has not f/u with urology.  Reports hot and cold chills as well as nausea and vomiting. No sick contacts or recent travel.  Abdominal surgical hx significant for appendectomy.     Past Medical History  Diagnosis Date  . Hypertension   . Obesity   . Arthritis   . Renal disorder   . Depression   . Migraine aura, persistent     since childhood  . Head ache   . Coronary artery disease   . GERD (gastroesophageal reflux disease)   . Asthma    Past Surgical History  Procedure Laterality Date  . Cardiac catheterization      x2  . Appendectomy     Family History  Problem Relation Age of Onset  . Hypertension Mother   . Hyperlipidemia Mother   . Hypertension Brother    History  Substance Use Topics  . Smoking status: Current Every Day Smoker  . Smokeless tobacco: Not on file  . Alcohol Use: No    Review of Systems  Constitutional: Positive for fever ( subjective), chills, appetite change and fatigue.  Respiratory: Negative for cough and shortness of breath.    Cardiovascular: Negative for chest pain and palpitations.  Gastrointestinal: Positive for nausea, vomiting, abdominal pain and diarrhea. Negative for constipation and blood in stool.  Genitourinary: Positive for dysuria, frequency, hematuria and flank pain ( bilateral). Negative for urgency, decreased urine volume, penile swelling, scrotal swelling, penile pain and testicular pain.  Musculoskeletal: Positive for back pain. Negative for myalgias.  Skin: Negative for rash.  All other systems reviewed and are negative.     Allergies  Trazodone and nefazodone  Home Medications   Prior to Admission medications   Medication Sig Start Date End Date Taking? Authorizing Provider  albuterol (PROVENTIL HFA;VENTOLIN HFA) 108 (90 BASE) MCG/ACT inhaler Inhale 2 puffs into the lungs every 4 (four) hours as needed for wheezing or shortness of breath. 08/09/14   Pricilla Loveless, MD  ALPRAZolam [name]) 1 MG tablet Take 1 mg by mouth 3 (three) times daily.    Historical Provider, MD  ALPRAZolam [name]) 1 MG tablet Take 1 tablet (1 mg total) by mouth 3 (three) times daily as needed for anxiety. 02/18/14   Doug Sou, MD  aspirin 81 MG tablet Take 81 mg by mouth daily.    Historical Provider, MD  cephALEXin (KEFLEX) 500 MG capsule Take 1 capsule (500 mg total) by mouth 4 (four) times daily. 10/20/14   [name], PA-C  clopidogrel (PLAVIX) 75 MG tablet Take 75 mg by mouth daily.    Historical  Provider, MD  fluticasone (FLONASE) 50 MCG/ACT nasal spray Place 2 sprays into both nostrils daily.    Historical Provider, MD  Fluticasone-Salmeterol (ADVAIR) 500-50 MCG/DOSE AEPB Inhale 2 puffs into the lungs every 12 (twelve) hours.      Historical Provider, MD  gabapentin (NEURONTIN) 800 MG tablet Take 800 mg by mouth 3 (three) times daily.    Historical Provider, MD  lisinopril (PRINIVIL,ZESTRIL) 10 MG tablet Take 1 tablet (10 mg total) by mouth daily. 10/20/14   Junius FinnerErin O'Malley, PA-C  metFORMIN (GLUCOPHAGE) 500 MG  tablet Take by mouth 2 (two) times daily with a meal.    Historical Provider, MD  metoprolol tartrate (LOPRESSOR) 25 MG tablet Take 1 tablet (25 mg total) by mouth once. 10/20/14   Junius FinnerErin O'Malley, PA-C  omeprazole (PRILOSEC) 20 MG capsule Take 20 mg by mouth daily.    Historical Provider, MD  ondansetron (ZOFRAN ODT) 4 MG disintegrating tablet 4mg  ODT q4 hours prn nausea/vomiting 10/20/14   Junius FinnerErin O'Malley, PA-C  oxyCODONE-acetaminophen (PERCOCET) 5-325 MG per tablet Take 1-2 tablets by mouth every 4 (four) hours as needed for severe pain. 10/20/14   Junius FinnerErin O'Malley, PA-C  pravastatin (PRAVACHOL) 40 MG tablet Take 1 tablet (40 mg total) by mouth daily. 10/20/14   Junius FinnerErin O'Malley, PA-C   BP 168/108 mmHg  Pulse 98  Temp(Src) 98.3 F (36.8 C) (Oral)  Resp 22  Ht 6\' 1"  (1.854 m)  Wt 310 lb (140.615 kg)  BMI 40.91 kg/m2  SpO2 95% Physical Exam  Constitutional: He appears well-developed and well-nourished.  Morbidly obese male sitting in exam bed, NAD. Non-toxic appearing.  HENT:  Head: Normocephalic and atraumatic.  Eyes: Conjunctivae are normal. No scleral icterus.  Neck: Normal range of motion. Neck supple.  Cardiovascular: Normal rate, regular rhythm and normal heart sounds.   Pulmonary/Chest: Effort normal and breath sounds normal. No respiratory distress. He has no wheezes. He has no rales. He exhibits no tenderness.  Abdominal: Soft. Bowel sounds are normal. He exhibits no distension and no mass. There is tenderness. There is CVA tenderness ( bilateral ). There is no rebound and no guarding.  Obese abdomen. Soft, diffuse abdominal tenderness. No rebound or guarding.  Musculoskeletal: Normal range of motion.  Neurological: He is alert.  Skin: Skin is warm and dry.  Nursing note and vitals reviewed.   ED Course  Procedures (including critical care time) Labs Review Labs Reviewed  COMPREHENSIVE METABOLIC PANEL - Abnormal; Notable for the following:    Albumin 3.4 (*)    All other components  within normal limits  LIPASE, BLOOD - Abnormal; Notable for the following:    Lipase 89 (*)    All other components within normal limits  URINALYSIS, ROUTINE W REFLEX MICROSCOPIC - Abnormal; Notable for the following:    Color, Urine RED (*)    APPearance TURBID (*)    Hgb urine dipstick LARGE (*)    Protein, ur 30 (*)    Nitrite POSITIVE (*)    Leukocytes, UA MODERATE (*)    All other components within normal limits  URINE MICROSCOPIC-ADD ON - Abnormal; Notable for the following:    Squamous Epithelial / LPF MANY (*)    Bacteria, UA MANY (*)    Casts HYALINE CASTS (*)    All other components within normal limits  URINE CULTURE  CBC WITH DIFFERENTIAL/PLATELET  GC/CHLAMYDIA PROBE AMP ([place])    Imaging Review No results found.   EKG Interpretation None      MDM  Final diagnoses:  Pyelonephritis  Trichimoniasis  Medication refill  History of hypertension    Pt is a 44yo male with hx of pyelonephrosis 2 months ago, presenting to ED with continued urinary symptoms and flank pain. Associated hot and cold chills, nausea and vomiting.   UA: positive for Hgb, TNTC WBC, and Trich.  Will send Urine Culture as well as GC/Chlamydia via urine.  Pt given IV rocephin, azithromycin and flagyl.  Will discharge home with 10 day course of keflex. Pt also given dilaudid, toradol, and zofran in ED.  Pt to have renal U/S to ensure no significant hyrdonephrosis, doubtful given normal CT abd in March.  4:56 PM Pt signed out to AGCO CorporationMarissa Sciacca, PA-C at shift change. Plan is to f/u on U/S. Pt to be discharged home to [facility] as well as Dr. Isabel CapriceGrapey, Urology. Home medications also refilled as pt [place] process of getting [place]        Kaleb Linquist O'Malley, [place] JerseyPA-C 10/20/14 1705  Eber HongBrian Miller, MD 10/22/14 [phone]

## 2014-10-20 NOTE — ED Notes (Signed)
Pt here for lower back pain, hematuria, flank pain.

## 2014-10-20 NOTE — ED Provider Notes (Signed)
Discussed case in great detail with Junius FinnerErin O'Malley, PA-C. Transfer of care to this provider at change in shift.   Plan: Ultrasound, if negative for acute kidney infection or abnormality patient can be discharged home. Discharge paperwork has been printed out by previous provider.  Richard Anderson is a 44 year old male with past history of hypertension, obesity, arthritis, renal disorder, depression, coronary artery disease, asthma resulting to the ED with bilateral kidney pain, hematuria, urinary incontinence been sleeping but has been ongoing for approximately one month - patient reported that this only occurs at night time, but stated that he is able to get to the bathroom and feels the urge to go to the bathroom when he is awake. Patient reports she's been experiencing back pain for the past month described as a punching sensation and suprapubic abdominal pain described as a pressure that is been ongoing for approximately one month. Patient reports that over the past week the pain has gotten progressively worse. Stated that he's been experiencing diarrhea as well along with nausea and vomiting secondary to pain. Patient reports that he was seen and assessed on 08/09/2014 regarding UTI with patient was diagnosed with pyelonephritis. [place] approximate 6 months ago, states that he has not followed up with an urologist nor a primary care provider secondary to insurance issues. Denied fever, chills, neck pain, neck stiffness, nausea, vomiting, melena, hematochezia, chest pain, shortness of breath, difficulty breathing, travels. PCP none   Alert and oriented 3. GCS 15. Heart rate and rhythm normal. Lungs good auscultation. Obese. Bowel sounds normal active in all 4 quadrants. Abdomen soft upon palpation with mild discomfort upon palpation to the suprapubic region-negative signs of acute abdomen. Mild right CVA tenderness. Mild tenderness upon palpation to lower  back. Full range of motion to upper and lower extremities bilaterally. Gait proper with-negative step-offs or sway.  CLINICAL DATA: Right flank pain for 4 days with nausea and vomiting. Hematuria.  EXAM: CT ABDOMEN AND PELVIS WITHOUT CONTRAST  TECHNIQUE: Multidetector CT imaging of the abdomen and pelvis was performed following the standard protocol without IV contrast.  COMPARISON: 03/24/2014  FINDINGS: BODY WALL: Unremarkable.  LOWER CHEST: Unremarkable.  ABDOMEN/PELVIS:  Liver: Hepatic steatosis  Biliary: No evidence of biliary obstruction or stone.  Pancreas: Unremarkable.  Spleen: Unremarkable.  Adrenals: Unremarkable.  Kidneys and ureters: No hydronephrosis or stone.  Bladder: Unremarkable.  Reproductive: Prominent dystrophic calcifications in the prostate, stable from prior.  Bowel: No obstruction. Appendectomy.  Retroperitoneum: No mass or adenopathy.  Peritoneum: No ascites or pneumoperitoneum.  Vascular: Scattered atherosclerotic calcification.  OSSEOUS: No acute abnormalities.  IMPRESSION: 1. No hydronephrosis or urolithiasis. 2. Hepatic steatosis.   Electronically Signed  By: Marnee SpringJonathon Watts M.D.  On: 08/09/2014 23:24   Results for orders placed or performed during the hospital encounter of 10/20/14  Comprehensive metabolic panel  Result Value Ref Range   Sodium 140 135 - 145 mmol/L   Potassium 4.0 3.5 - 5.1 mmol/L   Chloride 107 101 - 111 mmol/L   CO2 23 22 - 32 mmol/L   Glucose, Bld 92 65 - 99 mg/dL   BUN 10 6 - 20 mg/dL   Creatinine, Ser 7.840.90 0.61 - 1.24 mg/dL   Calcium 8.9 8.9 - 69.610.3 mg/dL   Total Protein 6.8 6.5 - 8.1 g/dL   Albumin 3.4 (L) 3.5 - 5.0 g/dL   AST 20 15 - 41 U/L   ALT 30 17 - 63 U/L   Alkaline Phosphatase 58 38 -  126 U/L   Total Bilirubin 0.5 0.3 - 1.2 mg/dL   GFR calc non Af Amer >60 >60 mL/min   GFR calc Af Amer >60 >60 mL/min   Anion gap 10 5 - 15  Lipase, blood  Result Value Ref  Range   Lipase 89 (H) 22 - 51 U/L  CBC with Differential  Result Value Ref Range   WBC 10.0 4.0 - 10.5 K/uL   RBC 5.10 4.22 - 5.81 MIL/uL   Hemoglobin 14.1 13.0 - 17.0 g/dL   HCT 19.143.3 47.839.0 - 29.552.0 %   MCV 84.9 78.0 - 100.0 fL   MCH 27.6 26.0 - 34.0 pg   MCHC 32.6 30.0 - 36.0 g/dL   RDW 62.114.4 30.811.5 - 65.715.5 %   Platelets 275 150 - 400 K/uL   Neutrophils Relative % 74 43 - 77 %   Neutro Abs 7.4 1.7 - 7.7 K/uL   Lymphocytes Relative 17 12 - 46 %   Lymphs Abs 1.7 0.7 - 4.0 K/uL   Monocytes Relative 8 3 - 12 %   Monocytes Absolute 0.8 0.1 - 1.0 K/uL   Eosinophils Relative 1 0 - 5 %   Eosinophils Absolute 0.1 0.0 - 0.7 K/uL   Basophils Relative 0 0 - 1 %   Basophils Absolute 0.0 0.0 - 0.1 K/uL  Urinalysis, Routine w reflex microscopic  Result Value Ref Range   Color, Urine RED (A) YELLOW   APPearance TURBID (A) CLEAR   Specific Gravity, Urine 1.016 1.005 - 1.030   pH 6.0 5.0 - 8.0   Glucose, UA NEGATIVE NEGATIVE mg/dL   Hgb urine dipstick LARGE (A) NEGATIVE   Bilirubin Urine NEGATIVE NEGATIVE   Ketones, ur NEGATIVE NEGATIVE mg/dL   Protein, ur 30 (A) NEGATIVE mg/dL   Urobilinogen, UA 0.2 0.0 - 1.0 mg/dL   Nitrite POSITIVE (A) NEGATIVE   Leukocytes, UA MODERATE (A) NEGATIVE  Urine microscopic-add on  Result Value Ref Range   Squamous Epithelial / LPF MANY (A) RARE   WBC, UA 7-10 <3 WBC/hpf   RBC / HPF TOO NUMEROUS TO COUNT <3 RBC/hpf   Bacteria, UA MANY (A) RARE   Casts HYALINE CASTS (A) NEGATIVE   Urine-Other TRICHOMONAS PRESENT    [place] Renal  10/20/2014   CLINICAL DATA:  Right-sided flank and back pain. Gross hematuria. Morbid obesity.  EXAM: RENAL / URINARY TRACT ULTRASOUND COMPLETE  COMPARISON:  Noncontrast CT on 08/09/2014  FINDINGS: Right Kidney:  Length: 11.8 cm. Echogenicity within normal limits. No mass or hydronephrosis visualized.  Left Kidney:  Length: 13.6 cm. Echogenicity within normal limits. No mass or hydronephrosis visualized.  Bladder:  Appears normal for degree of  bladder distention.  IMPRESSION: Negative. No evidence of hydronephrosis or other sonographic abnormality.   Electronically Signed   By: Myles RosenthalJohn  Stahl M.D.   On: 10/20/2014 17:05    6:36 PM patient reported that he needs to leave, his brother's car broke down. Patient walking around the ED without issues.   Diagnoses that have been ruled out:  None  Diagnoses that are still under consideration:  None  Final diagnoses:  Pyelonephritis  Trichimoniasis  Medication refill  History of hypertension    Medications  sodium chloride 0.9 % bolus 1,000 mL (0 mLs Intravenous Stopped 10/20/14 1751)  HYDROmorphone (DILAUDID) injection 1 mg (1 mg Intravenous Given 10/20/14 1415)  cefTRIAXone (ROCEPHIN) 1 g in dextrose 5 % 50 mL IVPB (0 g Intravenous Stopped 10/20/14 1614)  ketorolac (TORADOL) 30 MG/ML injection 30  mg (30 mg Intravenous Given 10/20/14 1545)  metroNIDAZOLE (FLAGYL) tablet 2,000 mg (2,000 mg Oral Given 10/20/14 1545)  azithromycin (ZITHROMAX) tablet 1,000 mg (1,000 mg Oral Given 10/20/14 1545)  HYDROmorphone (DILAUDID) injection 1 mg (1 mg Intravenous Given 10/20/14 1719)  ondansetron (ZOFRAN) injection 4 mg (4 mg Intravenous Given 10/20/14 1719)    Filed Vitals:   10/20/14 1515 10/20/14 1719 10/20/14 1730 10/20/14 1745  BP:  148/78 153/101 127/70  Pulse: 80 78 73 74  Temp:      TempSrc:      Resp:      Height:      Weight:      SpO2: 97% 98% 95% 99%   CBC negative elevated leukocytosis. Hemoglobin 14.1, hematocrit 43.3. CMP unremarkable. Lipase mildly elevated 89. Urinalysis noted large hemoglobin with red blood cells too numerous to count, positive nitrites, moderate leukocytes with white blood cell count is 7-10, hyaline cast and Trichomonas noted in urine. Urine culture pending. Renal ultrasound identified no evidence of hydronephrosis or other sonographic abnormalities. Patient given IV fluid, pain medication, antibiotics in ED setting. Patient has been treated prophylactically for  STD. Patient given IV fluids and antibiotics regarding UTI. Urine cultures pending. Labs, imaging, and case discussed with attending physician, Dr. Elesa Massed, in great detail - agreed to plan of discharge. Negative findings of end organ damage. Patient stable, afebrile. Patient not septic appearing. Discharged patient. Discharged with antibiotics and pain medications from previous provider. Patient has been referred to [facility] neurology. Discussed with patient to closely monitor symptoms and if symptoms are to worsen or change to report back to the ED - strict return instructions given.  Patient agreed to plan of care, understood, all questions answered.   Raymon Mutton, PA-C 10/20/14 1851  Layla Maw Ward, DO 10/20/14 1857

## 2014-10-20 NOTE — ED Notes (Signed)
Pt able to tolerate PO medications without emesis.

## 2014-10-20 NOTE — ED Notes (Signed)
Patient transported to Ultrasound 

## 2014-10-21 LAB — URINE CULTURE: Colony Count: >=100000

## 2014-10-23 ENCOUNTER — Telehealth: Payer: Self-pay | Admitting: *Deleted

## 2014-10-23 NOTE — ED Notes (Signed)
(+)  urine culture, treated with Cephalexin, OK per Antoine PrimasE. Martin, Pharm

## 2014-11-14 ENCOUNTER — Emergency Department (HOSPITAL_COMMUNITY)
Admission: EM | Admit: 2014-11-14 | Discharge: 2014-11-14 | Disposition: A | Discharge status: Home / Self Care | Payer: Medicaid - Out of State | Attending: Emergency Medicine | Admitting: Emergency Medicine

## 2014-11-14 ENCOUNTER — Encounter (HOSPITAL_COMMUNITY): Payer: Self-pay | Admitting: Emergency Medicine

## 2014-11-14 DIAGNOSIS — I10 Essential (primary) hypertension: Secondary | ICD-10-CM | POA: Insufficient documentation

## 2014-11-14 DIAGNOSIS — K219 Gastro-esophageal reflux disease without esophagitis: Secondary | ICD-10-CM | POA: Insufficient documentation

## 2014-11-14 DIAGNOSIS — G43509 Persistent migraine aura without cerebral infarction, not intractable, without status migrainosus: Secondary | ICD-10-CM | POA: Insufficient documentation

## 2014-11-14 DIAGNOSIS — N390 Urinary tract infection, site not specified: Secondary | ICD-10-CM

## 2014-11-14 DIAGNOSIS — Z7951 Long term (current) use of inhaled steroids: Secondary | ICD-10-CM | POA: Insufficient documentation

## 2014-11-14 DIAGNOSIS — Z720 Tobacco use: Secondary | ICD-10-CM | POA: Insufficient documentation

## 2014-11-14 DIAGNOSIS — R000 Tachycardia, unspecified: Secondary | ICD-10-CM | POA: Insufficient documentation

## 2014-11-14 DIAGNOSIS — Z9889 Other specified postprocedural states: Secondary | ICD-10-CM | POA: Insufficient documentation

## 2014-11-14 DIAGNOSIS — Z79899 Other long term (current) drug therapy: Secondary | ICD-10-CM | POA: Insufficient documentation

## 2014-11-14 DIAGNOSIS — E669 Obesity, unspecified: Secondary | ICD-10-CM | POA: Insufficient documentation

## 2014-11-14 DIAGNOSIS — R32 Unspecified urinary incontinence: Secondary | ICD-10-CM

## 2014-11-14 DIAGNOSIS — Z7982 Long term (current) use of aspirin: Secondary | ICD-10-CM | POA: Insufficient documentation

## 2014-11-14 DIAGNOSIS — J45909 Unspecified asthma, uncomplicated: Secondary | ICD-10-CM | POA: Insufficient documentation

## 2014-11-14 DIAGNOSIS — F329 Major depressive disorder, single episode, unspecified: Secondary | ICD-10-CM | POA: Insufficient documentation

## 2014-11-14 DIAGNOSIS — Z7902 Long term (current) use of antithrombotics/antiplatelets: Secondary | ICD-10-CM | POA: Insufficient documentation

## 2014-11-14 DIAGNOSIS — Z792 Long term (current) use of antibiotics: Secondary | ICD-10-CM | POA: Insufficient documentation

## 2014-11-14 DIAGNOSIS — M1990 Unspecified osteoarthritis, unspecified site: Secondary | ICD-10-CM | POA: Insufficient documentation

## 2014-11-14 DIAGNOSIS — I2510 Atherosclerotic heart disease of native coronary artery without angina pectoris: Secondary | ICD-10-CM | POA: Insufficient documentation

## 2014-11-14 LAB — URINE MICROSCOPIC-ADD ON

## 2014-11-14 LAB — CBC WITH DIFFERENTIAL/PLATELET
BASOS ABS: 0 10*3/uL (ref 0.0–0.1)
BASOS PCT: 0 % (ref 0–1)
EOS ABS: 0.1 10*3/uL (ref 0.0–0.7)
EOS PCT: 1 % (ref 0–5)
HEMATOCRIT: 45 % (ref 39.0–52.0)
HEMOGLOBIN: 14.9 g/dL (ref 13.0–17.0)
Lymphocytes Relative: 12 % (ref 12–46)
Lymphs Abs: 1.2 10*3/uL (ref 0.7–4.0)
MCH: 28.2 pg (ref 26.0–34.0)
MCHC: 33.1 g/dL (ref 30.0–36.0)
MCV: 85.1 fL (ref 78.0–100.0)
Monocytes Absolute: 0.8 10*3/uL (ref 0.1–1.0)
Monocytes Relative: 8 % (ref 3–12)
NEUTROS ABS: 7.9 10*3/uL — AB (ref 1.7–7.7)
Neutrophils Relative %: 79 % — ABNORMAL HIGH (ref 43–77)
Platelets: 299 10*3/uL (ref 150–400)
RBC: 5.29 MIL/uL (ref 4.22–5.81)
RDW: 13.9 % (ref 11.5–15.5)
WBC: 9.9 10*3/uL (ref 4.0–10.5)

## 2014-11-14 LAB — COMPREHENSIVE METABOLIC PANEL
ALT: 47 U/L (ref 17–63)
ANION GAP: 9 (ref 5–15)
AST: 42 U/L — AB (ref 15–41)
Albumin: 3.9 g/dL (ref 3.5–5.0)
Alkaline Phosphatase: 64 U/L (ref 38–126)
BUN: 12 mg/dL (ref 6–20)
CO2: 25 mmol/L (ref 22–32)
Calcium: 8.8 mg/dL — ABNORMAL LOW (ref 8.9–10.3)
Chloride: 106 mmol/L (ref 101–111)
Creatinine, Ser: 0.98 mg/dL (ref 0.61–1.24)
GFR calc non Af Amer: >60 mL/min (ref >60)
Glucose, Bld: 126 mg/dL — ABNORMAL HIGH (ref 65–99)
Potassium: 3.8 mmol/L (ref 3.5–5.1)
Sodium: 140 mmol/L (ref 135–145)
Total Bilirubin: 0.4 mg/dL (ref 0.3–1.2)
Total Protein: 7.7 g/dL (ref 6.5–8.1)

## 2014-11-14 LAB — URINALYSIS, ROUTINE W REFLEX MICROSCOPIC
Bilirubin Urine: NEGATIVE
Glucose, UA: NEGATIVE mg/dL
KETONES UR: NEGATIVE mg/dL
NITRITE: POSITIVE — AB
PH: 7.5 (ref 5.0–8.0)
Protein, ur: NEGATIVE mg/dL
Specific Gravity, Urine: 1.012 (ref 1.005–1.030)
Urobilinogen, UA: 0.2 mg/dL (ref 0.0–1.0)

## 2014-11-14 LAB — LIPASE, BLOOD: LIPASE: 37 U/L (ref 22–51)

## 2014-11-14 MED ORDER — HYDROCODONE-ACETAMINOPHEN 5-325 MG PO TABS
2.0000 | ORAL_TABLET | Freq: Once | ORAL | Status: AC
  Administered 2014-11-14: 2 via ORAL
  Filled 2014-11-14: qty 2

## 2014-11-14 MED ORDER — AZITHROMYCIN 250 MG PO TABS
1000.0000 mg | ORAL_TABLET | Freq: Once | ORAL | Status: AC
  Administered 2014-11-14: 1000 mg via ORAL
  Filled 2014-11-14: qty 4

## 2014-11-14 MED ORDER — METRONIDAZOLE 500 MG PO TABS
2000.0000 mg | ORAL_TABLET | Freq: Once | ORAL | Status: AC
  Administered 2014-11-14: 2000 mg via ORAL
  Filled 2014-11-14: qty 4

## 2014-11-14 MED ORDER — CIPROFLOXACIN HCL 500 MG PO TABS: 500.0000 mg | ORAL_TABLET | Freq: Two times a day (BID) | ORAL | Status: DC

## 2014-11-14 MED ORDER — CEFTRIAXONE SODIUM 250 MG IJ SOLR: 250.0000 mg | Freq: Once | INTRAMUSCULAR | Status: DC

## 2014-11-14 MED ORDER — CEFTRIAXONE SODIUM 1 G IJ SOLR
1.0000 g | Freq: Once | INTRAMUSCULAR | Status: AC
  Administered 2014-11-14: 1 g via INTRAMUSCULAR
  Filled 2014-11-14: qty 10

## 2014-11-14 NOTE — ED Provider Notes (Signed)
CSN: [id number]     Arrival date & time 11/14/14  1132 History   First MD Initiated Contact with Patient 11/14/14 1308     Chief Complaint  Patient presents with  . Flank Pain     (Consider location/radiation/quality/duration/timing/severity/associated sxs/prior Treatment) HPI Comments: 44 year old male with smoking, obesity, recurrent urine infection history presents with dysuria, frequency and mild incontinence along with right flank pain. Patient has had this multiple times the past. Patient pre-last time with anabiotic symptoms return. Patient has not seen urology since. No known prostate problems. No fevers or chills no leg weakness or leg numbness. Patient has been sexually active since being treated. Patient prefers men as sexual partner.  Patient is a 44 y.o. male presenting with flank pain. The history is provided by the patient.  Flank Pain Pertinent negatives include no chest pain, no abdominal pain, no headaches and no shortness of breath.    Past Medical History  Diagnosis Date  . Hypertension   . Obesity   . Arthritis   . Renal disorder   . Depression   . Migraine aura, persistent     since childhood  . Head ache   . Coronary artery disease   . GERD (gastroesophageal reflux disease)   . Asthma    Past Surgical History  Procedure Laterality Date  . Cardiac catheterization      x2  . Appendectomy     Family History  Problem Relation Age of Onset  . Hypertension Mother   . Hyperlipidemia Mother   . Hypertension Brother    History  Substance Use Topics  . Smoking status: Current Every Day Smoker  . Smokeless tobacco: Not on file  . Alcohol Use: No    Review of Systems  Constitutional: Negative for fever and chills.  HENT: Negative for congestion.   Eyes: Negative for visual disturbance.  Respiratory: Negative for shortness of breath.   Cardiovascular: Negative for chest pain.  Gastrointestinal: Negative for vomiting and abdominal pain.  Genitourinary:  Positive for dysuria, frequency and flank pain.  Musculoskeletal: Negative for back pain, neck pain and neck stiffness.  Skin: Negative for rash.  Neurological: Negative for weakness, light-headedness, numbness and headaches.      Allergies  Trazodone and nefazodone  Home Medications   Prior to Admission medications   Medication Sig Start Date End Date Taking? Authorizing Provider  albuterol (PROVENTIL HFA;VENTOLIN HFA) 108 (90 BASE) MCG/ACT inhaler Inhale 2 puffs into the lungs every 4 (four) hours as needed for wheezing or shortness of breath. 08/09/14  Yes Pricilla Loveless, MD  ALPRAZolam [name]) 1 MG tablet Take 1 mg by mouth 3 (three) times daily.    Historical Provider, MD  aspirin 81 MG tablet Take 81 mg by mouth daily.    Historical Provider, MD  cephALEXin (KEFLEX) 500 MG capsule Take 1 capsule (500 mg total) by mouth 4 (four) times daily. Patient not taking: Reported on 11/14/2014 10/20/14   [name], PA-C  clopidogrel (PLAVIX) 75 MG tablet Take 75 mg by mouth daily.    Historical Provider, MD  fluticasone (FLONASE) 50 MCG/ACT nasal spray Place 2 sprays into both nostrils daily.    Historical Provider, MD  Fluticasone-Salmeterol (ADVAIR) 250-50 MCG/DOSE AEPB Inhale 1 puff into the lungs 2 (two) times daily.    Historical Provider, MD  Fluticasone-Salmeterol (ADVAIR) 500-50 MCG/DOSE AEPB Inhale 2 puffs into the lungs every 12 (twelve) hours.      Historical Provider, MD  gabapentin (NEURONTIN) 800 MG tablet Take 800  mg by mouth 3 (three) times daily.    Historical Provider, MD  lisinopril (PRINIVIL,ZESTRIL) 10 MG tablet Take 1 tablet (10 mg total) by mouth daily. 10/20/14   Junius FinnerErin O'Malley, PA-C  metFORMIN (GLUCOPHAGE) 500 MG tablet Take by mouth 2 (two) times daily with a meal.    Historical Provider, MD  metoprolol tartrate (LOPRESSOR) 25 MG tablet Take 1 tablet (25 mg total) by mouth once. 10/20/14   Junius FinnerErin O'Malley, PA-C  omeprazole (PRILOSEC) 20 MG capsule Take 20 mg by mouth daily.     Historical Provider, MD  ondansetron (ZOFRAN ODT) 4 MG disintegrating tablet 4mg  ODT q4 hours prn nausea/vomiting 10/20/14   Junius FinnerErin O'Malley, PA-C  oxyCODONE-acetaminophen (PERCOCET) 5-325 MG per tablet Take 1-2 tablets by mouth every 4 (four) hours as needed for severe pain. 10/20/14   Junius FinnerErin O'Malley, PA-C  pravastatin (PRAVACHOL) 40 MG tablet Take 1 tablet (40 mg total) by mouth daily. 10/20/14   Junius FinnerErin O'Malley, PA-C   BP 135/85 mmHg  Pulse 115  Temp(Src) 98 F (36.7 C) (Oral)  Resp 20  SpO2 98% Physical Exam  Constitutional: He is oriented to person, place, and time. He appears well-developed and well-nourished.  HENT:  Head: Normocephalic and atraumatic.  Eyes: Conjunctivae are normal. Right eye exhibits no discharge. Left eye exhibits no discharge.  Neck: Normal range of motion. Neck supple. No tracheal deviation present.  Cardiovascular: Regular rhythm.  Tachycardia present.   Pulmonary/Chest: Effort normal and breath sounds normal.  Abdominal: Soft. He exhibits no distension. There is tenderness (obese, mild suprapubic, mild right flank). There is no guarding.  Musculoskeletal: He exhibits no edema.  Neurological: He is alert and oriented to person, place, and time.  Skin: Skin is warm. No rash noted.  Psychiatric: He has a normal mood and affect.  Nursing note and vitals reviewed.   ED Course  Procedures (including critical care time) Labs Review Labs Reviewed  CBC WITH DIFFERENTIAL/PLATELET - Abnormal; Notable for the following:    Neutrophils Relative % 79 (*)    Neutro Abs 7.9 (*)    All other components within normal limits  COMPREHENSIVE METABOLIC PANEL - Abnormal; Notable for the following:    Glucose, Bld 126 (*)    Calcium 8.8 (*)    AST 42 (*)    All other components within normal limits  URINALYSIS, ROUTINE W REFLEX MICROSCOPIC (NOT AT [facility]) - Abnormal; Notable for the following:    APPearance CLOUDY (*)    Hgb urine dipstick LARGE (*)    Nitrite POSITIVE (*)     Leukocytes, UA MODERATE (*)    All other components within normal limits  URINE MICROSCOPIC-ADD ON - Abnormal; Notable for the following:    Squamous Epithelial / LPF MANY (*)    Bacteria, UA FEW (*)    All other components within normal limits  LIPASE, BLOOD    Imaging Review No results found.   EKG Interpretation None      MDM   Final diagnoses:  Urinary incontinence  UTI (lower urinary tract infection)   Patient with recurrent kidney and urine infection, sexually active presents with concern for urine infection/STD. Prophylactic antibodies given, 1 g Rocephin given for possible pyelonephritis. Patient understands he needs very close follow-up with urology as discussed prior. Plan for oral antibodies. Last UA reviewed, trichomonas. Results and differential diagnosis were discussed with the patient/parent/guardian. Close follow up outpatient was discussed, comfortable with the plan.   Medications  HYDROcodone-acetaminophen (NORCO/VICODIN) 5-325 MG per tablet 2 tablet (  2 tablets Oral Given 11/14/14 1346)  azithromycin (ZITHROMAX) tablet 1,000 mg (1,000 mg Oral Given 11/14/14 1346)  metroNIDAZOLE (FLAGYL) tablet 2,000 mg (2,000 mg Oral Given 11/14/14 1346)  cefTRIAXone (ROCEPHIN) injection 1 g (1 g Intramuscular Given 11/14/14 1348)    Filed Vitals:   11/14/14 1137  BP: 135/85  Pulse: 115  Temp: 98 F (36.7 C)  TempSrc: Oral  Resp: 20  SpO2: 98%    Final diagnoses:  Urinary incontinence  UTI (lower urinary tract infection)       Blane Ohara, MD 11/14/14 1422

## 2014-11-14 NOTE — ED Notes (Signed)
Bed: WA24 Expected date:  Expected time:  Means of arrival:  Comments: EMS 

## 2014-11-14 NOTE — ED Notes (Signed)
Pt states that he was dx w/ [facility]  States that he was told to come back if he did not feel any better after finishing the abx.  States that he has been urinating on himself and having bilateral flank pain (worse on rt) and vomiting.

## 2014-11-14 NOTE — Discharge Instructions (Signed)
Follow-up closely with urology for your urinary symptoms. Return to the ER if he develop weakness or numbness in one of your extremities or if your urinary incontinence does not improve despite antibiotics. No sexual intercourse until you finish antibiotics. If you were given medicines take as directed.  If you are on coumadin or contraceptives realize their levels and effectiveness is altered by many different medicines.  If you have any reaction (rash, tongues swelling, other) to the medicines stop taking and see a physician.    If your blood pressure was elevated in the ER make sure you follow up for management with a primary doctor or return for chest pain, shortness of breath or stroke symptoms.  Please follow up as directed and return to the ER or see a physician for new or worsening symptoms.  Thank you. Filed Vitals:   11/14/14 1137  BP: 135/85  Pulse: 115  Temp: 98 F (36.7 C)  TempSrc: Oral  Resp: 20  SpO2: 98%

## 2014-12-02 ENCOUNTER — Emergency Department (HOSPITAL_COMMUNITY)
Admission: EM | Admit: 2014-12-02 | Discharge: 2014-12-03 | Disposition: A | Discharge status: Home / Self Care | Payer: Self-pay | Attending: Emergency Medicine | Admitting: Emergency Medicine

## 2014-12-02 ENCOUNTER — Encounter (HOSPITAL_COMMUNITY): Payer: Self-pay | Admitting: Emergency Medicine

## 2014-12-02 DIAGNOSIS — F1110 Opioid abuse, uncomplicated: Secondary | ICD-10-CM | POA: Insufficient documentation

## 2014-12-02 DIAGNOSIS — Z87448 Personal history of other diseases of urinary system: Secondary | ICD-10-CM | POA: Insufficient documentation

## 2014-12-02 DIAGNOSIS — M545 Low back pain, unspecified: Secondary | ICD-10-CM

## 2014-12-02 DIAGNOSIS — R45851 Suicidal ideations: Secondary | ICD-10-CM

## 2014-12-02 DIAGNOSIS — I2510 Atherosclerotic heart disease of native coronary artery without angina pectoris: Secondary | ICD-10-CM | POA: Insufficient documentation

## 2014-12-02 DIAGNOSIS — G43909 Migraine, unspecified, not intractable, without status migrainosus: Secondary | ICD-10-CM | POA: Insufficient documentation

## 2014-12-02 DIAGNOSIS — Z720 Tobacco use: Secondary | ICD-10-CM | POA: Insufficient documentation

## 2014-12-02 DIAGNOSIS — I10 Essential (primary) hypertension: Secondary | ICD-10-CM | POA: Insufficient documentation

## 2014-12-02 DIAGNOSIS — F39 Unspecified mood [affective] disorder: Secondary | ICD-10-CM | POA: Insufficient documentation

## 2014-12-02 DIAGNOSIS — R300 Dysuria: Secondary | ICD-10-CM

## 2014-12-02 DIAGNOSIS — Z7982 Long term (current) use of aspirin: Secondary | ICD-10-CM | POA: Insufficient documentation

## 2014-12-02 DIAGNOSIS — F1310 Sedative, hypnotic or anxiolytic abuse, uncomplicated: Secondary | ICD-10-CM | POA: Insufficient documentation

## 2014-12-02 DIAGNOSIS — F329 Major depressive disorder, single episode, unspecified: Secondary | ICD-10-CM | POA: Insufficient documentation

## 2014-12-02 DIAGNOSIS — F1210 Cannabis abuse, uncomplicated: Secondary | ICD-10-CM | POA: Insufficient documentation

## 2014-12-02 DIAGNOSIS — M1990 Unspecified osteoarthritis, unspecified site: Secondary | ICD-10-CM | POA: Insufficient documentation

## 2014-12-02 DIAGNOSIS — J45909 Unspecified asthma, uncomplicated: Secondary | ICD-10-CM | POA: Insufficient documentation

## 2014-12-02 DIAGNOSIS — Z7902 Long term (current) use of antithrombotics/antiplatelets: Secondary | ICD-10-CM | POA: Insufficient documentation

## 2014-12-02 DIAGNOSIS — Z79899 Other long term (current) drug therapy: Secondary | ICD-10-CM | POA: Insufficient documentation

## 2014-12-02 DIAGNOSIS — N390 Urinary tract infection, site not specified: Secondary | ICD-10-CM | POA: Insufficient documentation

## 2014-12-02 LAB — COMPREHENSIVE METABOLIC PANEL
ALT: 23 U/L (ref 17–63)
AST: 19 U/L (ref 15–41)
Albumin: 3.8 g/dL (ref 3.5–5.0)
Alkaline Phosphatase: 63 U/L (ref 38–126)
Anion gap: 8 (ref 5–15)
BUN: 18 mg/dL (ref 6–20)
CO2: 25 mmol/L (ref 22–32)
Calcium: 8.7 mg/dL — ABNORMAL LOW (ref 8.9–10.3)
Chloride: 105 mmol/L (ref 101–111)
Creatinine, Ser: 0.88 mg/dL (ref 0.61–1.24)
GFR calc Af Amer: >60 mL/min (ref >60)
GFR calc non Af Amer: >60 mL/min (ref >60)
Glucose, Bld: 107 mg/dL — ABNORMAL HIGH (ref 65–99)
Potassium: 3.9 mmol/L (ref 3.5–5.1)
Sodium: 138 mmol/L (ref 135–145)
Total Bilirubin: 0.4 mg/dL (ref 0.3–1.2)
Total Protein: 7.2 g/dL (ref 6.5–8.1)

## 2014-12-02 LAB — SALICYLATE LEVEL: Salicylate Lvl: <4 mg/dL (ref 2.8–30.0)

## 2014-12-02 LAB — ETHANOL: Alcohol, Ethyl (B): <5 mg/dL (ref <5)

## 2014-12-02 LAB — CBC
HCT: 46.2 % (ref 39.0–52.0)
Hemoglobin: 14.6 g/dL (ref 13.0–17.0)
MCH: 27.5 pg (ref 26.0–34.0)
MCHC: 31.6 g/dL (ref 30.0–36.0)
MCV: 87 fL (ref 78.0–100.0)
Platelets: 294 K/uL (ref 150–400)
RBC: 5.31 MIL/uL (ref 4.22–5.81)
RDW: 14.1 % (ref 11.5–15.5)
WBC: 10.1 K/uL (ref 4.0–10.5)

## 2014-12-02 LAB — ACETAMINOPHEN LEVEL: Acetaminophen (Tylenol), Serum: <10 ug/mL — ABNORMAL LOW (ref 10–30)

## 2014-12-02 MED ORDER — LORAZEPAM 1 MG PO TABS
0.0000 mg | ORAL_TABLET | Freq: Four times a day (QID) | ORAL | Status: DC
  Administered 2014-12-02: 2 mg via ORAL
  Administered 2014-12-03: 1 mg via ORAL
  Filled 2014-12-02: qty 1
  Filled 2014-12-02: qty 3

## 2014-12-02 MED ORDER — IBUPROFEN 200 MG PO TABS: 600.0000 mg | ORAL_TABLET | Freq: Three times a day (TID) | ORAL | Status: DC | PRN

## 2014-12-02 MED ORDER — ONDANSETRON HCL 4 MG PO TABS: 4.0000 mg | ORAL_TABLET | Freq: Three times a day (TID) | ORAL | Status: DC | PRN

## 2014-12-02 MED ORDER — HALOPERIDOL LACTATE 5 MG/ML IJ SOLN: 10.0000 mg | Freq: Once | INTRAMUSCULAR | Status: DC

## 2014-12-02 MED ORDER — IBUPROFEN 200 MG PO TABS: 600.0000 mg | ORAL_TABLET | Freq: Four times a day (QID) | ORAL | Status: DC | PRN

## 2014-12-02 MED ORDER — ACETAMINOPHEN 325 MG PO TABS: 650.0000 mg | ORAL_TABLET | ORAL | Status: DC | PRN

## 2014-12-02 MED ORDER — HYDROXYZINE HCL 25 MG PO TABS
50.0000 mg | ORAL_TABLET | Freq: Every evening | ORAL | Status: DC | PRN
  Administered 2014-12-02: 50 mg via ORAL
  Filled 2014-12-02: qty 2

## 2014-12-02 MED ORDER — LISINOPRIL 10 MG PO TABS
10.0000 mg | ORAL_TABLET | Freq: Once | ORAL | Status: AC
  Administered 2014-12-02: 10 mg via ORAL
  Filled 2014-12-02: qty 1

## 2014-12-02 MED ORDER — ADULT MULTIVITAMIN W/MINERALS CH
1.0000 | ORAL_TABLET | Freq: Every day | ORAL | Status: DC
  Administered 2014-12-02 – 2014-12-03 (×2): 1 via ORAL
  Filled 2014-12-02 (×2): qty 1

## 2014-12-02 MED ORDER — FOLIC ACID 1 MG PO TABS
1.0000 mg | ORAL_TABLET | Freq: Every day | ORAL | Status: DC
  Administered 2014-12-03: 1 mg via ORAL
  Filled 2014-12-02: qty 1

## 2014-12-02 MED ORDER — VITAMIN B-1 100 MG PO TABS
100.0000 mg | ORAL_TABLET | Freq: Every day | ORAL | Status: DC
  Administered 2014-12-02 – 2014-12-03 (×2): 100 mg via ORAL
  Filled 2014-12-02 (×2): qty 1

## 2014-12-02 MED ORDER — GABAPENTIN 400 MG PO CAPS
400.0000 mg | ORAL_CAPSULE | Freq: Three times a day (TID) | ORAL | Status: DC
  Administered 2014-12-02 – 2014-12-03 (×2): 400 mg via ORAL
  Filled 2014-12-02 (×2): qty 1

## 2014-12-02 MED ORDER — ALUM & MAG HYDROXIDE-SIMETH 200-200-20 MG/5ML PO SUSP: 30.0000 mL | ORAL | Status: DC | PRN

## 2014-12-02 MED ORDER — ZOLPIDEM TARTRATE 5 MG PO TABS: 5.0000 mg | ORAL_TABLET | Freq: Every evening | ORAL | Status: DC | PRN

## 2014-12-02 MED ORDER — GABAPENTIN 800 MG PO TABS
400.0000 mg | ORAL_TABLET | Freq: Three times a day (TID) | ORAL | Status: DC
  Filled 2014-12-02: qty 0.5

## 2014-12-02 MED ORDER — LORAZEPAM 1 MG PO TABS
1.0000 mg | ORAL_TABLET | Freq: Four times a day (QID) | ORAL | Status: DC | PRN
  Administered 2014-12-02: 1 mg via ORAL
  Filled 2014-12-02: qty 1

## 2014-12-02 MED ORDER — PANTOPRAZOLE SODIUM 40 MG PO TBEC
40.0000 mg | DELAYED_RELEASE_TABLET | Freq: Every day | ORAL | Status: DC
  Administered 2014-12-02 – 2014-12-03 (×2): 40 mg via ORAL
  Filled 2014-12-02 (×2): qty 1

## 2014-12-02 MED ORDER — MOMETASONE FURO-FORMOTEROL FUM 100-5 MCG/ACT IN AERO
2.0000 | INHALATION_SPRAY | Freq: Two times a day (BID) | RESPIRATORY_TRACT | Status: DC
  Administered 2014-12-02 – 2014-12-03 (×2): 2 via RESPIRATORY_TRACT
  Filled 2014-12-02: qty 8.8

## 2014-12-02 MED ORDER — ZOLPIDEM TARTRATE 5 MG PO TABS
5.0000 mg | ORAL_TABLET | Freq: Every evening | ORAL | Status: DC | PRN
  Administered 2014-12-02: 5 mg via ORAL
  Filled 2014-12-02: qty 1

## 2014-12-02 MED ORDER — LORAZEPAM 1 MG PO TABS: 1.0000 mg | ORAL_TABLET | Freq: Three times a day (TID) | ORAL | Status: DC | PRN

## 2014-12-02 MED ORDER — THIAMINE HCL 100 MG/ML IJ SOLN: 100.0000 mg | Freq: Every day | INTRAMUSCULAR | Status: DC

## 2014-12-02 MED ORDER — NICOTINE 21 MG/24HR TD PT24
21.0000 mg | MEDICATED_PATCH | Freq: Every day | TRANSDERMAL | Status: DC
  Administered 2014-12-03: 21 mg via TRANSDERMAL
  Filled 2014-12-02: qty 1

## 2014-12-02 MED ORDER — LORAZEPAM 1 MG PO TABS: 0.0000 mg | ORAL_TABLET | Freq: Two times a day (BID) | ORAL | Status: DC

## 2014-12-02 MED ORDER — LORAZEPAM 2 MG/ML IJ SOLN: 1.0000 mg | Freq: Four times a day (QID) | INTRAMUSCULAR | Status: DC | PRN

## 2014-12-02 NOTE — ED Notes (Signed)
Pt is awake and alert, during assessment and orientation to the unit pt became aggressive and agitated due to he is unable to use his personal phone or have a television remote control, pt reports that he is no longer having SI thoughts would like to be discharged. NP advised of pt request. Pt is yelling however is redirected by TTS. Will continue to monitor for safety. [email or web address]

## 2014-12-02 NOTE — ED Notes (Signed)
Report received from [facility]. Pt. Alert and oriented in no acute distress denies SI, HI, AVH and pain.  Pt. Instructed to come to me with problems or concerns.Will continue to monitor for safety via security cameras and Q 15 minute checks.

## 2014-12-02 NOTE — ED Notes (Addendum)
Pt reports he has had 5 family members die in the last year and his sister just had a stroke which has made his depression worse. No HI or hallucinations. Pt also reports he is still having flank pain since last visit, abx has not helped.

## 2014-12-02 NOTE — ED Notes (Signed)
Pt. Noted sleeping in room. No complaints or concerns voiced. No distress or abnormal behavior noted. Will continue to monitor with security cameras. Q 15 minute rounds continue. 

## 2014-12-02 NOTE — ED Notes (Addendum)
Per Mobile Crisis: pt expressed SI with plan to overdose on all of his medications. Pt drinks 2 fifths of liquor daily and was drinking upon start of mobile crisis interview. Also abuses marijuana, valium and percocet

## 2014-12-02 NOTE — ED Provider Notes (Signed)
CSN: [id number]     Arrival date & time 12/02/14  1606 History   First MD Initiated Contact with Patient 12/02/14 1622     Chief Complaint  Patient presents with  . Suicidal   Hx obtained by PT and triage note from [facility]   (Consider location/radiation/quality/duration/timing/severity/associated sxs/prior Treatment) HPI  Pt is a morbidly obese male with hx of HTN, renal disorder, CAD, GERD, asthma, migraines, and depression, presenting to ED with c/o SI that started earlier today.  Per [facility], pt expressed SI with a plan to overdose on his medications.  Pt reports drinking 1/2 a fifth of liquor today but normally drinks 2 fifths of liquor daily.  Pt also reports abusing marijuana, percocet, valium, and soma. All of which he took PTA.  Pt states he has had 5 family members die in the last year and his sister just had a stroke, which made his depression worse.  Denies HI or hallucinations. Denies recent attempt of self harm but does report prior suicide attempt by overdose several years ago.  Denies heroine or cocaine use recently but has used heroine in the past.   Pt also c/o lower back pain he describes as a "kidney pain" that is worse on the Right. Pt states this is chronic pain and has been dx with UTI several times.  Last UTI was dx on 11/14/14, he has since completed his course of ciprofloxacin but still has burning with urination, incontinence, hematuria, and "kidney pain." denies fever, chills, n/v/d. He has been referred to urology several times for similar symptoms but due to lack of insurance, has not followed up.   Past Medical History  Diagnosis Date  . Hypertension   . Obesity   . Arthritis   . Renal disorder   . Depression   . Migraine aura, persistent     since childhood  . Head ache   . Coronary artery disease   . GERD (gastroesophageal reflux disease)   . Asthma    Past Surgical History  Procedure Laterality Date  . Cardiac catheterization      x2  .  Appendectomy     Family History  Problem Relation Age of Onset  . Hypertension Mother   . Hyperlipidemia Mother   . Hypertension Brother    History  Substance Use Topics  . Smoking status: Current Every Day Smoker  . Smokeless tobacco: Not on file  . Alcohol Use: No    Review of Systems  Constitutional: Negative for fever, chills, diaphoresis, appetite change and fatigue.  Respiratory: Negative for cough and shortness of breath.   Cardiovascular: Negative for chest pain and palpitations.  Gastrointestinal: Negative for nausea, vomiting, abdominal pain and diarrhea.  Genitourinary: Positive for dysuria, urgency, frequency, hematuria and flank pain (bilateral, chronic). Negative for decreased urine volume, discharge, penile swelling, scrotal swelling, penile pain and testicular pain.       Incontinence   Musculoskeletal: Positive for back pain. Negative for myalgias.  Psychiatric/Behavioral: Positive for suicidal ideas and sleep disturbance. Negative for hallucinations, behavioral problems, self-injury and agitation.  All other systems reviewed and are negative.     Allergies  Aspartame and phenylalanine; Bee venom; and Trazodone and nefazodone  Home Medications   Prior to Admission medications   Medication Sig Start Date End Date Taking? Authorizing Provider  albuterol (PROVENTIL HFA;VENTOLIN HFA) 108 (90 BASE) MCG/ACT inhaler Inhale 2 puffs into the lungs every 4 (four) hours as needed for wheezing or shortness of breath. 08/09/14  Yes Pricilla Loveless, MD  aspirin 81 MG tablet Take 81 mg by mouth daily.   Yes Historical Provider, MD  carisoprodol (SOMA) 350 MG tablet Take 350 mg by mouth 3 (three) times daily as needed for muscle spasms (muscle spasms).   Yes Historical Provider, MD  diazepam (VALIUM) 5 MG tablet Take 5 mg by mouth every 6 (six) hours as needed for anxiety (anxiety).   Yes Historical Provider, MD  fluticasone (FLONASE) 50 MCG/ACT nasal spray Place 2 sprays into  both nostrils daily.   Yes Historical Provider, MD  gabapentin (NEURONTIN) 800 MG tablet Take 800 mg by mouth 3 (three) times daily.   Yes Historical Provider, MD  oxyCODONE-acetaminophen (PERCOCET) 5-325 MG per tablet Take 1-2 tablets by mouth every 4 (four) hours as needed for severe pain. 10/20/14  Yes [name], PA-C  ALPRAZolam [name]) 1 MG tablet Take 1 mg by mouth 3 (three) times daily.    Historical Provider, MD  cephALEXin (KEFLEX) 500 MG capsule Take 1 capsule (500 mg total) by mouth 4 (four) times daily. Patient not taking: Reported on 11/14/2014 10/20/14   [name], PA-C  ciprofloxacin (CIPRO) 500 MG tablet Take 1 tablet (500 mg total) by mouth 2 (two) times daily. One po bid x 7 days Patient not taking: Reported on 12/02/2014 11/14/14   Blane Ohara, MD  clopidogrel (PLAVIX) 75 MG tablet Take 75 mg by mouth daily.    Historical Provider, MD  Fluticasone-Salmeterol (ADVAIR) 250-50 MCG/DOSE AEPB Inhale 1 puff into the lungs 2 (two) times daily.    Historical Provider, MD  lisinopril (PRINIVIL,ZESTRIL) 10 MG tablet Take 1 tablet (10 mg total) by mouth daily. 10/20/14   [name], PA-C  metoprolol tartrate (LOPRESSOR) 25 MG tablet Take 1 tablet (25 mg total) by mouth once. 10/20/14   [name], PA-C  ondansetron (ZOFRAN ODT) 4 MG disintegrating tablet  ODT q4 hours prn nausea/vomiting 10/20/14   [name], PA-C  pravastatin (PRAVACHOL) 40 MG tablet Take 1 tablet (40 mg total) by mouth daily. 10/20/14   [name], PA-C   BP 132/60 mmHg  Pulse 93  Temp(Src) 98.4 F (36.9 C) (Oral)  Resp 20  SpO2 97% Physical Exam  Constitutional: He appears well-developed and well-nourished.  Morbidly obese male sitting in exam chair, flat affect. NAD  HENT:  Head: Normocephalic and atraumatic.  Eyes: Conjunctivae are normal. No scleral icterus.  Neck: Normal range of motion.  Cardiovascular: Normal rate, regular rhythm and normal heart sounds.   Pulmonary/Chest: Effort normal  and breath sounds normal. No respiratory distress. He has no wheezes. He has no rales. He exhibits no tenderness.  Abdominal: Soft. Bowel sounds are normal. He exhibits no distension and no mass. There is no tenderness. There is no rebound and no guarding.  Musculoskeletal: Normal range of motion.  Neurological: He is alert.  Skin: Skin is warm and dry.  Psychiatric: His speech is normal and behavior is normal. He is not actively hallucinating. He exhibits a depressed mood. He expresses suicidal ideation. He expresses no homicidal ideation. He expresses suicidal plans. He expresses no homicidal plans.  Nursing note and vitals reviewed.   ED Course  Procedures (including critical care time) Labs Review Labs Reviewed  ACETAMINOPHEN LEVEL - Abnormal; Notable for the following:    Acetaminophen (Tylenol), Serum <10 (*)    All other components within normal limits  COMPREHENSIVE METABOLIC PANEL - Abnormal; Notable for the following:    Glucose, Bld 107 (*)    Calcium  8.7 (*)    All other components within normal limits  CBC  ETHANOL  SALICYLATE LEVEL  URINE RAPID DRUG SCREEN, HOSP PERFORMED  URINALYSIS, ROUTINE W REFLEX MICROSCOPIC (NOT AT [facility])    Imaging Review No results found.   EKG Interpretation None      MDM   Final diagnoses:  Suicidal ideations  Dysuria  Bilateral low back pain without sciatica    Pt directed to ED by his [facility] team due to reports of SI with plan to OD on his medications.  Hx of prior attempt of OD several years ago.  Denies HI.  Reports use of etoh as well as valium, percocet and marijuana.  Pt also c/o urinary symptoms.  Hx of chronic kidney problems but has not f/u with urology as recommended several times due to lack of insurance. Pt was tx for a UTI with Cipro on 11/14/14. Reports completing the antibiotic course w/o relief.  Psych hold orders placed.   Pt evaluated by TTS, recommends pt be evaluated by psychiatrist in the morning.  Due to  behavior issues secondary to psychiatric hx, difficulty obtaining a urine specimen to check for resolution of most recent UTI.  Pt is otherwise medically clear.  UA order is in place for sample to be sent once pt provides sample.  TTS/[facility] staff have been made aware pt needs to provide urine sample.   12:47 AM Pt discussed with Elpidio Anis, PA-C at shift change.  No additional tx necessary unless UA is collected and comes back concerning for UTI, tx appropriately.  Otherwise, pt is to be evaluated by psychiatry in the morning to determine disposition.      [name], PA-C 12/03/14 9741   Medical screening examination/treatment/procedure(s) were performed by non-physician practitioner and as supervising physician I was immediately available for consultation/collaboration.   EKG Interpretation None     Medical screening examination/treatment/procedure(s) were conducted as a shared visit with non-physician practitioner(s) and myself.  I personally evaluated the patient during the encounter.   EKG Interpretation None     Patient with suicidal ideation and polysubstance abuse issue. Will consult with behavioral health. Urinalysis pending at this time.  Donnetta Hutching, MD 12/04/14 2029
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
## 2014-12-03 DIAGNOSIS — R45851 Suicidal ideations: Secondary | ICD-10-CM | POA: Insufficient documentation

## 2014-12-03 DIAGNOSIS — F39 Unspecified mood [affective] disorder: Secondary | ICD-10-CM

## 2014-12-03 LAB — URINE MICROSCOPIC-ADD ON

## 2014-12-03 LAB — URINALYSIS, ROUTINE W REFLEX MICROSCOPIC
Bilirubin Urine: NEGATIVE
Glucose, UA: NEGATIVE mg/dL
Ketones, ur: NEGATIVE mg/dL
Nitrite: NEGATIVE
Protein, ur: NEGATIVE mg/dL
Specific Gravity, Urine: 1.025 (ref 1.005–1.030)
Urobilinogen, UA: 0.2 mg/dL (ref 0.0–1.0)
pH: 6 (ref 5.0–8.0)

## 2014-12-03 LAB — RAPID URINE DRUG SCREEN, HOSP PERFORMED
Amphetamines: NOT DETECTED
Barbiturates: NOT DETECTED
Benzodiazepines: POSITIVE — AB
Cocaine: NOT DETECTED
Opiates: POSITIVE — AB
Tetrahydrocannabinol: POSITIVE — AB

## 2014-12-03 MED ORDER — CEPHALEXIN 500 MG PO CAPS: 500.0000 mg | ORAL_CAPSULE | Freq: Four times a day (QID) | ORAL | Status: DC

## 2014-12-03 MED ORDER — CEPHALEXIN 500 MG PO CAPS
500.0000 mg | ORAL_CAPSULE | Freq: Once | ORAL | Status: AC
  Administered 2014-12-03: 500 mg via ORAL
  Filled 2014-12-03: qty 1

## 2014-12-03 NOTE — ED Notes (Signed)
Pt. Noted sleeping in room. No complaints or concerns voiced. No distress or abnormal behavior noted. Will continue to monitor with security cameras. Q 15 minute rounds continue. 

## 2014-12-03 NOTE — [facility] Suicide Risk Assessment (Cosign Needed)
Suicide Risk Assessment  Discharge Assessment   [facility] Discharge Suicide Risk Assessment   Demographic Factors:  Male, Richard Anderson, lesbian, or bisexual orientation, Low socioeconomic status and Unemployed  Total Time spent with patient: 20 minutes  Musculoskeletal: Strength & Muscle Tone: within normal limits Gait & Station: normal Patient leans: N/A  Psychiatric Specialty Exam:     Blood pressure 145/89, pulse 101, temperature 98.1 F (36.7 C), temperature source Oral, resp. rate 20, SpO2 98 %.There is no weight on file to calculate BMI.  General Appearance: Casual  Eye Contact::  Good  Speech:  Clear and Coherent and Normal Rate  Volume:  Normal  Mood:  Euthymic  Affect:  Congruent  Thought Process:  Coherent, Goal Directed and Intact  Orientation:  Full (Time, Place, and Person)  Thought Content:  WDL  Suicidal Thoughts:  No  Homicidal Thoughts:  No  Memory:  Immediate;   Good Recent;   Good Remote;   Good  Judgement:  Fair  Insight:  Good  Psychomotor Activity:  Normal  Concentration:  Good  Recall:  NA  Fund of Knowledge:Good  Language: Good  Akathisia:  NA  Handed:  Right  AIMS (if indicated):     Assets:  Desire for Improvement  ADL's:  Intact  Cognition: WNL        Has this patient used any form of tobacco in the last 30 days? (Cigarettes, Smokeless Tobacco, Cigars, and/or Pipes) Yes, A prescription for an FDA-approved tobacco cessation medication was offered at discharge and the patient refused  Mental Status Per Nursing Assessment::   On Admission:     Current Mental Status by Physician: NA  Loss Factors: NA  Historical Factors: Prior suicide attempts  Risk Reduction Factors:   Religious beliefs about death, Living with another person, especially a relative and Positive therapeutic relationship  Continued Clinical Symptoms:  Depression:   Insomnia Alcohol/Substance Abuse/Dependencies  Cognitive Features That Contribute To Risk:  Polarized  thinking    Suicide Risk:  Minimal: No identifiable suicidal ideation.  Patients presenting with no risk factors but with morbid ruminations; may be classified as minimal risk based on the severity of the depressive symptoms  Principal Problem: Mood disorder Discharge Diagnoses:  Patient Active Problem List   Diagnosis Date Noted  . Mood disorder [F39] 12/03/2014    Priority: High  . Suicidal ideations [R45.851]   . Migraine aura, persistent [G43.509]       Plan Of Care/Follow-up recommendations:  Activity:  as tolerated Diet:  regular  Is patient on multiple antipsychotic therapies at discharge:  No   Has Patient had three or more failed trials of antipsychotic monotherapy by history:  No  Recommended Plan for Multiple Antipsychotic Therapies: NA    Huxton Glaus C   PMHNP-BC 12/03/2014, 2:29 PM

## 2014-12-03 NOTE — Progress Notes (Signed)
Outpatient information provided to patient for [facility], [facility], [facility], and [facility] for grief and loss counseling. No other concerns provided by patient.

## 2014-12-03 NOTE — Consult Note (Signed)
[facility] Consult   Reason for Consult:  Mood disorder Referring Physician:  EDP Patient Identification: Richard Anderson MRN:  [id number] Principal Diagnosis: Mood disorder Diagnosis:   Patient Active Problem List   Diagnosis Date Noted  . Mood disorder [F39] 12/03/2014    Priority: High  . Migraine aura, persistent [G43.509]     Total Time spent with patient: 1 hour  Subjective:   Richard Anderson is a 44 y.o. male patient admitted with Mood disorder, .  HPI: Caucasian male, 44 years old was evaluated for relapsing on Alcohol,  Benzos and Opiates  And feeling increasingly depressed.  Patient was seen yesterday after he voluntaryly brought self in seeking treatment for Substance relapse and suicidal thoughts.   He was sober for 14 years and relapsed in the past 8 years where he uses substances off and on.   Patient, after coming in wanted to be discharged home.  Patient was IVC by the EDP.  Today patient was calm and cooperative and participated in the interview.  Patient reported that he came  to the hospital for detox treatment.  He reported ingesting three tablets of Soma, 3-4 tablets of 5 mg Vicodin and 2, 5 mg Valium  Tablet.  He vehemently denied ever endorsing suicide although he has a previous hx of suicide attempt 4 years ago by OD on Heroin when his father passed away.  Patient reported that he he moved down to [facility] from [place] and has had problem securing a good insurance.  He was taking all of his medications for mental and medical health till 6 months ago when he could not afford it any more.   Patient reports a hx of Depression where he was given ECT treatment in [place] in the past.  He reports feeling depressed but not as bad as to need ECT this time.  Patient is asking for Detox treatment from Alcohol and reports drinking 1/2 a fifth of Rum daily.  Patient reports his stressors includes his sister having a stroke and loss of 5 family members in the past 4  years.  Patient is unemployed and lives with his male fiance.  Patient reports good relationship and stated that all he need is to secure a Psychiatrist and to get outpatient rehabilitation treatment and counseling.  Patient denies SI/HI/AVH and states that his deterrence from hurting himself is his love for his fiance.  Patient is diharged home with information for [facility] care in the community.  HPI Elements:   Location:  Mood disorder, Alcohol use disorder,severe Opioid use disorder, severe,Benzodiazepine use disorder, severe . Quality:  Moderate-severe. Severity:  Moderate-severe. Timing:  Acute. Duration:  Chronic substance use, Chronic mental and medical illness. Context:  Brought self in for treatment of substance use and depression..  Past Medical History:  Past Medical History  Diagnosis Date  . Hypertension   . Obesity   . Arthritis   . Renal disorder   . Depression   . Migraine aura, persistent     since childhood  . Head ache   . Coronary artery disease   . GERD (gastroesophageal reflux disease)   . Asthma     Past Surgical History  Procedure Laterality Date  . Cardiac catheterization      x2  . Appendectomy     Family History:  Family History  Problem Relation Age of Onset  . Hypertension Mother   . Hyperlipidemia Mother   . Hypertension Brother    Social History:  History  Alcohol Use No     History  Drug Use No    History   Social History  . Marital Status: Single    Spouse Name: N/A  . Number of Children: N/A  . Years of Education: N/A   Social History Main Topics  . Smoking status: Current Every Day Smoker  . Smokeless tobacco: Not on file  . Alcohol Use: No  . Drug Use: No  . Sexual Activity: Yes     Comment: Pt states he is in a monogomous homosexual relationship.    Other Topics Concern  . None   Social History Narrative   Additional Social History:    Pain Medications: Pt reports as much as he can get Prescriptions: pt reports as  much as he can get Over the Counter: pt denies History of alcohol / drug use?: Yes Longest period of sobriety (when/how long): 14 years until 8 years ago Name of Substance 1: Alcohol 1 - Age of First Use: 10 1 - Amount (size/oz): 2/5 1 - Frequency: daily 1 - Duration: for the last 6 months 1 - Last Use / Amount: 12/02/14 at least 2 24oz beers Name of Substance 2: Opioids  2 - Amount (size/oz): various amounts 2 - Frequency: daily 2 - Duration: for at least 6 months 2 - Last Use / Amount: 12/02/14 4 vicodin, 3 valium, 2 soma                 Allergies:   Allergies  Allergen Reactions  . Aspartame And Phenylalanine     Migraine  . Bee Venom   . Trazodone And Nefazodone Other (See Comments)    priapism    Labs:  Results for orders placed or performed during the hospital encounter of 12/02/14 (from the past 48 hour(s))  Acetaminophen level     Status: Abnormal   Collection Time: 12/02/14  4:50 PM  Result Value Ref Range   Acetaminophen (Tylenol), Serum <10 (L) 10 - 30 ug/mL    Comment:        THERAPEUTIC CONCENTRATIONS VARY SIGNIFICANTLY. A RANGE OF 10-30 ug/mL MAY BE AN EFFECTIVE CONCENTRATION FOR MANY PATIENTS. HOWEVER, SOME ARE BEST TREATED AT CONCENTRATIONS OUTSIDE THIS RANGE. ACETAMINOPHEN CONCENTRATIONS >150 ug/mL AT 4 HOURS AFTER INGESTION AND >50 ug/mL AT 12 HOURS AFTER INGESTION ARE OFTEN ASSOCIATED WITH TOXIC REACTIONS.   CBC     Status: None   Collection Time: 12/02/14  4:50 PM  Result Value Ref Range   WBC 10.1 4.0 - 10.5 K/uL   RBC 5.31 4.22 - 5.81 MIL/uL   Hemoglobin 14.6 13.0 - 17.0 g/dL   HCT 46.2 39.0 - 52.0 %   MCV 87.0 78.0 - 100.0 fL   MCH 27.5 26.0 - 34.0 pg   MCHC 31.6 30.0 - 36.0 g/dL   RDW 14.1 11.5 - 15.5 %   Platelets 294 150 - 400 K/uL  Comprehensive metabolic panel     Status: Abnormal   Collection Time: 12/02/14  4:50 PM  Result Value Ref Range   Sodium 138 135 - 145 mmol/L   Potassium 3.9 3.5 - 5.1 mmol/L   Chloride 105 101  - 111 mmol/L   CO2 25 22 - 32 mmol/L   Glucose, Bld 107 (H) 65 - 99 mg/dL   BUN 18 6 - 20 mg/dL   Creatinine, Ser 0.88 0.61 - 1.24 mg/dL   Calcium 8.7 (L) 8.9 - 10.3 mg/dL   Total Protein 7.2 6.5 - 8.1 g/dL  Albumin 3.8 3.5 - 5.0 g/dL   AST 19 15 - 41 U/L   ALT 23 17 - 63 U/L   Alkaline Phosphatase 63 38 - 126 U/L   Total Bilirubin 0.4 0.3 - 1.2 mg/dL   GFR calc non Af Amer >60 >60 mL/min   GFR calc Af Amer >60 >60 mL/min    Comment: (NOTE) The eGFR has been calculated using the CKD EPI equation. This calculation has not been validated in all clinical situations. eGFR's persistently <60 mL/min signify possible Chronic Kidney Disease.    Anion gap 8 5 - 15  Ethanol (ETOH)     Status: None   Collection Time: 12/02/14  4:50 PM  Result Value Ref Range   Alcohol, Ethyl (B) <5 <5 mg/dL    Comment:        LOWEST DETECTABLE LIMIT FOR SERUM ALCOHOL IS 5 mg/dL FOR MEDICAL PURPOSES ONLY   Salicylate level     Status: None   Collection Time: 12/02/14  4:50 PM  Result Value Ref Range   Salicylate Lvl <0.9 2.8 - 30.0 mg/dL  Urine rapid drug screen (hosp performed)not at [facility]     Status: Abnormal   Collection Time: 12/03/14  4:38 AM  Result Value Ref Range   Opiates POSITIVE (A) NONE DETECTED   Cocaine NONE DETECTED NONE DETECTED   Benzodiazepines POSITIVE (A) NONE DETECTED   Amphetamines NONE DETECTED NONE DETECTED   Tetrahydrocannabinol POSITIVE (A) NONE DETECTED   Barbiturates NONE DETECTED NONE DETECTED    Comment:        DRUG SCREEN FOR MEDICAL PURPOSES ONLY.  IF CONFIRMATION IS NEEDED FOR ANY PURPOSE, NOTIFY LAB WITHIN 5 DAYS.        LOWEST DETECTABLE LIMITS FOR URINE DRUG SCREEN Drug Class       Cutoff (ng/mL) Amphetamine      1000 Barbiturate      200 Benzodiazepine   323 Tricyclics       557 Opiates          300 Cocaine          300 THC              50   Urinalysis, Routine w reflex microscopic (not at [facility])     Status: Abnormal   Collection Time: 12/03/14   4:38 AM  Result Value Ref Range   Color, Urine YELLOW YELLOW   APPearance CLOUDY (A) CLEAR   Specific Gravity, Urine 1.025 1.005 - 1.030   pH 6.0 5.0 - 8.0   Glucose, UA NEGATIVE NEGATIVE mg/dL   Hgb urine dipstick TRACE (A) NEGATIVE   Bilirubin Urine NEGATIVE NEGATIVE   Ketones, ur NEGATIVE NEGATIVE mg/dL   Protein, ur NEGATIVE NEGATIVE mg/dL   Urobilinogen, UA 0.2 0.0 - 1.0 mg/dL   Nitrite NEGATIVE NEGATIVE   Leukocytes, UA MODERATE (A) NEGATIVE  Urine microscopic-add on     Status: Abnormal   Collection Time: 12/03/14  4:38 AM  Result Value Ref Range   Squamous Epithelial / LPF RARE RARE   WBC, UA 11-20 <3 WBC/hpf   RBC / HPF 3-6 <3 RBC/hpf   Bacteria, UA FEW (A) RARE    Vitals: Blood pressure 145/89, pulse 101, temperature 98.1 F (36.7 C), temperature source Oral, resp. rate 20, SpO2 98 %.  Risk to Self: Suicidal Ideation: Yes-Currently Present Suicidal Intent: No Is patient at risk for suicide?: No Suicidal Plan?: No Access to Means: No How many times?: 2 Triggers for Past Attempts: Other (Comment) (  mother and father's deaths) Intentional Self Injurious Behavior: None Risk to Others: Homicidal Ideation: No Thoughts of Harm to Others: No Current Homicidal Intent: No Current Homicidal Plan: No Access to Homicidal Means: No History of harm to others?: No Assessment of Violence: None Noted Does patient have access to weapons?: No Criminal Charges Pending?: No Does patient have a court date: No Prior Inpatient Therapy: Prior Inpatient Therapy: Yes Prior Therapy Dates: 5 years ago Prior Therapy Facilty/Provider(s): [facility], in [place] Reason for Treatment: SI Prior Outpatient Therapy: Prior Outpatient Therapy: No Does patient have an ACCT team?: No Does patient have Intensive In-House Services?  : No Does patient have [facility] services? : No Does patient have P4CC services?: No  Current Facility-Administered Medications  Medication Dose Route Frequency Provider Last  Rate Last Dose  . acetaminophen (TYLENOL) tablet 650 mg  650 mg Oral Q4H PRN Noland Fordyce, PA-C      . alum & mag hydroxide-simeth (MAALOX/MYLANTA) 200-200-20 MG/5ML suspension 30 mL  30 mL Oral PRN Noland Fordyce, PA-C      . folic acid (FOLVITE) tablet 1 mg  1 mg Oral Daily Benjamine Mola, FNP   1 mg at 12/03/14 0841  . gabapentin (NEURONTIN) capsule 400 mg  400 mg Oral TID Nat Christen, MD   400 mg at 12/03/14 5009  . haloperidol lactate (HALDOL) injection 10 mg  10 mg Intramuscular Once Nat Christen, MD   0 mg at 12/02/14 2136  . hydrOXYzine (ATARAX/VISTARIL) tablet 50 mg  50 mg Oral QHS PRN Benjamine Mola, FNP   50 mg at 12/02/14 2004  . ibuprofen (ADVIL,MOTRIN) tablet 600 mg  600 mg Oral Q6H PRN Patrecia Pour, NP      . LORazepam (ATIVAN) tablet 1 mg  1 mg Oral Q6H PRN Benjamine Mola, FNP   1 mg at 12/02/14 2006   Or  . LORazepam (ATIVAN) injection 1 mg  1 mg Intravenous Q6H PRN Benjamine Mola, FNP      . LORazepam (ATIVAN) tablet 0-4 mg  0-4 mg Oral Q6H Benjamine Mola, FNP   1 mg at 12/03/14 3818   Followed by  . [START ON 12/04/2014] LORazepam (ATIVAN) tablet 0-4 mg  0-4 mg Oral Q12H John C Withrow, FNP      . mometasone-formoterol (DULERA) 100-5 MCG/ACT inhaler 2 puff  2 puff Inhalation BID Patrecia Pour, NP   2 puff at 12/03/14 [phone]  . multivitamin with minerals tablet 1 tablet  1 tablet Oral Daily Benjamine Mola, FNP   1 tablet at 12/03/14 [phone]  . nicotine (NICODERM CQ - dosed in mg/24 hours) patch 21 mg  21 mg Transdermal Daily Noland Fordyce, PA-C   21 mg at 12/03/14 0840  . ondansetron (ZOFRAN) tablet 4 mg  4 mg Oral Q8H PRN Noland Fordyce, PA-C      . pantoprazole (PROTONIX) EC tablet 40 mg  40 mg Oral Daily Patrecia Pour, NP   40 mg at 12/03/14 0842  . thiamine (VITAMIN B-1) tablet 100 mg  100 mg Oral Daily Benjamine Mola, FNP   100 mg at 12/03/14 6789   Or  . thiamine (B-1) injection 100 mg  100 mg Intravenous Daily Benjamine Mola, FNP      . zolpidem (AMBIEN) tablet 5 mg  5 mg  Oral QHS PRN Patrecia Pour, NP   5 mg at 12/02/14 2129   Current Outpatient Prescriptions  Medication Sig Dispense Refill  . albuterol (PROVENTIL  HFA;VENTOLIN HFA) 108 (90 BASE) MCG/ACT inhaler Inhale 2 puffs into the lungs every 4 (four) hours as needed for wheezing or shortness of breath. 1 Inhaler 1  . aspirin 81 MG tablet Take 81 mg by mouth daily.    . carisoprodol (SOMA) 350 MG tablet Take 350 mg by mouth 3 (three) times daily as needed for muscle spasms (muscle spasms).    . diazepam (VALIUM) 5 MG tablet Take 5 mg by mouth every 6 (six) hours as needed for anxiety (anxiety).    . fluticasone (FLONASE) 50 MCG/ACT nasal spray Place 2 sprays into both nostrils daily.    Marland Kitchen gabapentin (NEURONTIN) 800 MG tablet Take 800 mg by mouth 3 (three) times daily.    Marland Kitchen oxyCODONE-acetaminophen (PERCOCET) 5-325 MG per tablet Take 1-2 tablets by mouth every 4 (four) hours as needed for severe pain. 20 tablet 0  . ALPRAZolam (XANAX) 1 MG tablet Take 1 mg by mouth 3 (three) times daily.    . cephALEXin (KEFLEX) 500 MG capsule Take 1 capsule (500 mg total) by mouth 4 (four) times daily. 28 capsule 0  . ciprofloxacin (CIPRO) 500 MG tablet Take 1 tablet (500 mg total) by mouth 2 (two) times daily. One po bid x 7 days (Patient not taking: Reported on 12/02/2014) 14 tablet 0  . clopidogrel (PLAVIX) 75 MG tablet Take 75 mg by mouth daily.    . Fluticasone-Salmeterol (ADVAIR) 250-50 MCG/DOSE AEPB Inhale 1 puff into the lungs 2 (two) times daily.    Marland Kitchen lisinopril (PRINIVIL,ZESTRIL) 10 MG tablet Take 1 tablet (10 mg total) by mouth daily. 30 tablet 0  . metoprolol tartrate (LOPRESSOR) 25 MG tablet Take 1 tablet (25 mg total) by mouth once. 30 tablet 0  . ondansetron (ZOFRAN ODT) 4 MG disintegrating tablet 14m ODT q4 hours prn nausea/vomiting 15 tablet 0  . pravastatin (PRAVACHOL) 40 MG tablet Take 1 tablet (40 mg total) by mouth daily. 30 tablet 0    Musculoskeletal: Strength & Muscle Tone: within normal limits Gait  & Station: normal Patient leans: N/A  Psychiatric Specialty Exam: Physical Exam  Review of Systems  Constitutional: Negative.   Eyes: Negative.   Respiratory: Negative.   Cardiovascular:       Hx of cardiac disease and HTN on medications.  Gastrointestinal: Negative.   Genitourinary: Negative.   Musculoskeletal: Negative.   Skin: Negative.   Neurological: Positive for headaches (hx of Migraines ).  Endo/Heme/Allergies: Negative.     Blood pressure 145/89, pulse 101, temperature 98.1 F (36.7 C), temperature source Oral, resp. rate 20, SpO2 98 %.There is no weight on file to calculate BMI.  General Appearance: Casual  Eye Contact::  Good  Speech:  Clear and Coherent and Normal Rate  Volume:  Normal  Mood:  Euthymic  Affect:  Congruent  Thought Process:  Coherent, Goal Directed and Intact  Orientation:  Full (Time, Place, and Person)  Thought Content:  WDL  Suicidal Thoughts:  No  Homicidal Thoughts:  No  Memory:  Immediate;   Good Recent;   Good Remote;   Good  Judgement:  Fair  Insight:  Good  Psychomotor Activity:  Normal  Concentration:  Good  Recall:  NA  Fund of Knowledge:Good  Language: Good  Akathisia:  NA  Handed:  Right  AIMS (if indicated):     Assets:  Desire for Improvement  ADL's:  Intact  Cognition: WNL  Sleep:      Medical Decision Making: Established Problem, Stable/Improving (1)  Disposition:  Discharge home, follow up with providers in the community for your MH, medical care needs.  Follow up with [facility] services for outpatient Rehabilitation for substance abuse  Delfin Gant   PMHNP-BC 12/03/2014 1:52 PM  Patient seen face-to-face for this psychiatric evaluation, case discussed with treatment team, physician extender and treatment plan formulated. Reviewed the information documented and agree with the treatment plan.   Sharnay Cashion,JANARDHAHA R. 12/04/2014 4:34 PM

## 2014-12-29 ENCOUNTER — Emergency Department (HOSPITAL_COMMUNITY): Payer: Medicaid - Out of State

## 2014-12-29 ENCOUNTER — Encounter (HOSPITAL_COMMUNITY): Payer: Self-pay | Admitting: Nurse Practitioner

## 2014-12-29 ENCOUNTER — Emergency Department (HOSPITAL_COMMUNITY)
Admission: EM | Admit: 2014-12-29 | Discharge: 2014-12-29 | Disposition: A | Discharge status: Home / Self Care | Payer: Self-pay | Attending: Emergency Medicine | Admitting: Emergency Medicine

## 2014-12-29 DIAGNOSIS — I10 Essential (primary) hypertension: Secondary | ICD-10-CM | POA: Insufficient documentation

## 2014-12-29 DIAGNOSIS — N200 Calculus of kidney: Secondary | ICD-10-CM | POA: Insufficient documentation

## 2014-12-29 DIAGNOSIS — E669 Obesity, unspecified: Secondary | ICD-10-CM | POA: Insufficient documentation

## 2014-12-29 DIAGNOSIS — F329 Major depressive disorder, single episode, unspecified: Secondary | ICD-10-CM | POA: Insufficient documentation

## 2014-12-29 DIAGNOSIS — J45909 Unspecified asthma, uncomplicated: Secondary | ICD-10-CM | POA: Insufficient documentation

## 2014-12-29 DIAGNOSIS — Z720 Tobacco use: Secondary | ICD-10-CM | POA: Insufficient documentation

## 2014-12-29 DIAGNOSIS — Z79899 Other long term (current) drug therapy: Secondary | ICD-10-CM | POA: Insufficient documentation

## 2014-12-29 DIAGNOSIS — I2510 Atherosclerotic heart disease of native coronary artery without angina pectoris: Secondary | ICD-10-CM | POA: Insufficient documentation

## 2014-12-29 DIAGNOSIS — K219 Gastro-esophageal reflux disease without esophagitis: Secondary | ICD-10-CM | POA: Insufficient documentation

## 2014-12-29 DIAGNOSIS — Z7982 Long term (current) use of aspirin: Secondary | ICD-10-CM | POA: Insufficient documentation

## 2014-12-29 DIAGNOSIS — N390 Urinary tract infection, site not specified: Secondary | ICD-10-CM | POA: Insufficient documentation

## 2014-12-29 DIAGNOSIS — Z7902 Long term (current) use of antithrombotics/antiplatelets: Secondary | ICD-10-CM | POA: Insufficient documentation

## 2014-12-29 DIAGNOSIS — M1990 Unspecified osteoarthritis, unspecified site: Secondary | ICD-10-CM | POA: Insufficient documentation

## 2014-12-29 DIAGNOSIS — G43509 Persistent migraine aura without cerebral infarction, not intractable, without status migrainosus: Secondary | ICD-10-CM | POA: Insufficient documentation

## 2014-12-29 LAB — COMPREHENSIVE METABOLIC PANEL
ALBUMIN: 3.5 g/dL (ref 3.5–5.0)
ALK PHOS: 58 U/L (ref 38–126)
ALT: 30 U/L (ref 17–63)
AST: 35 U/L (ref 15–41)
Anion gap: 7 (ref 5–15)
BILIRUBIN TOTAL: 0.9 mg/dL (ref 0.3–1.2)
BUN: 12 mg/dL (ref 6–20)
CO2: 26 mmol/L (ref 22–32)
Calcium: 8.6 mg/dL — ABNORMAL LOW (ref 8.9–10.3)
Chloride: 107 mmol/L (ref 101–111)
Creatinine, Ser: 0.99 mg/dL (ref 0.61–1.24)
GFR calc Af Amer: >60 mL/min (ref >60)
GFR calc non Af Amer: >60 mL/min (ref >60)
GLUCOSE: 110 mg/dL — AB (ref 65–99)
Potassium: 4.5 mmol/L (ref 3.5–5.1)
Sodium: 140 mmol/L (ref 135–145)
Total Protein: 6.7 g/dL (ref 6.5–8.1)

## 2014-12-29 LAB — CBC
HEMATOCRIT: 44 % (ref 39.0–52.0)
Hemoglobin: 14.2 g/dL (ref 13.0–17.0)
MCH: 27.9 pg (ref 26.0–34.0)
MCHC: 32.3 g/dL (ref 30.0–36.0)
MCV: 86.4 fL (ref 78.0–100.0)
PLATELETS: 302 10*3/uL (ref 150–400)
RBC: 5.09 MIL/uL (ref 4.22–5.81)
RDW: 14.2 % (ref 11.5–15.5)
WBC: 10.4 10*3/uL (ref 4.0–10.5)

## 2014-12-29 LAB — URINE MICROSCOPIC-ADD ON

## 2014-12-29 LAB — URINALYSIS, ROUTINE W REFLEX MICROSCOPIC
BILIRUBIN URINE: NEGATIVE
Glucose, UA: NEGATIVE mg/dL
Ketones, ur: NEGATIVE mg/dL
Nitrite: POSITIVE — AB
PROTEIN: 100 mg/dL — AB
Specific Gravity, Urine: 1.019 (ref 1.005–1.030)
Urobilinogen, UA: 1 mg/dL (ref 0.0–1.0)
pH: 8 (ref 5.0–8.0)

## 2014-12-29 MED ORDER — OXYCODONE-ACETAMINOPHEN 5-325 MG PO TABS: 1.0000 | ORAL_TABLET | Freq: Four times a day (QID) | ORAL | Status: DC | PRN

## 2014-12-29 MED ORDER — HYDROMORPHONE HCL 1 MG/ML IJ SOLN
1.0000 mg | Freq: Once | INTRAMUSCULAR | Status: AC
  Administered 2014-12-29: 1 mg via INTRAVENOUS
  Filled 2014-12-29: qty 1

## 2014-12-29 MED ORDER — KETOROLAC TROMETHAMINE 30 MG/ML IJ SOLN
30.0000 mg | Freq: Once | INTRAMUSCULAR | Status: AC
  Administered 2014-12-29: 30 mg via INTRAVENOUS
  Filled 2014-12-29: qty 1

## 2014-12-29 MED ORDER — LEVOFLOXACIN 500 MG PO TABS: 500.0000 mg | ORAL_TABLET | Freq: Every day | ORAL | Status: DC

## 2014-12-29 MED ORDER — ONDANSETRON HCL 4 MG/2ML IJ SOLN
4.0000 mg | Freq: Once | INTRAMUSCULAR | Status: AC
  Administered 2014-12-29: 4 mg via INTRAVENOUS
  Filled 2014-12-29: qty 2

## 2014-12-29 NOTE — ED Notes (Signed)
Pt off unit for CT

## 2014-12-29 NOTE — Discharge Instructions (Signed)
Return here as needed.  Follow-up with the urologist provided.  Increase your fluid intake, rest as much as possible °

## 2014-12-29 NOTE — ED Notes (Signed)
He c/o R sided flank pain, hematuria since last night. He reports n/v also. He states "it feels like when i had a kidney stone."

## 2014-12-29 NOTE — ED Provider Notes (Signed)
CSN: [id number]     Arrival date & time 12/29/14  1155 History   First MD Initiated Contact with Patient 12/29/14 1216     Chief Complaint  Patient presents with  . Flank Pain     (Consider location/radiation/quality/duration/timing/severity/associated sxs/prior Treatment) HPI Patient presents to the emergency department with right flank pain and hematuria that started yesterday.  The patient states that he feels like she has had a kidney stone is passing patient states that he has chronic flank discomfort in your urinary tract infections.  The patient states that he has been here multiple times for these.  Patient denies any nausea, vomiting, weakness, dizziness, headache, blurred vision, chest pain, shortness of breath, incontinence, or syncope.  The patient states that he has not had any fevers.  Patient states that nothing seems make his condition, better or worse.  Patient states he does have some nocturnal incontinence at times Past Medical History  Diagnosis Date  . Hypertension   . Obesity   . Arthritis   . Renal disorder   . Depression   . Migraine aura, persistent     since childhood  . Head ache   . Coronary artery disease   . GERD (gastroesophageal reflux disease)   . Asthma    Past Surgical History  Procedure Laterality Date  . Cardiac catheterization      x2  . Appendectomy     Family History  Problem Relation Age of Onset  . Hypertension Mother   . Hyperlipidemia Mother   . Hypertension Brother    History  Substance Use Topics  . Smoking status: Current Every Day Smoker  . Smokeless tobacco: Not on file  . Alcohol Use: No    Review of Systems All other systems negative except as documented in the HPI. All pertinent positives and negatives as reviewed in the HPI.   Allergies  Aspartame and phenylalanine; Bee venom; Doxycycline; and Trazodone and nefazodone  Home Medications   Prior to Admission medications   Medication Sig Start Date End Date  Taking? Authorizing Provider  albuterol (PROVENTIL HFA;VENTOLIN HFA) 108 (90 BASE) MCG/ACT inhaler Inhale 2 puffs into the lungs every 4 (four) hours as needed for wheezing or shortness of breath. 08/09/14  Yes Pricilla Loveless, MD  ALPRAZolam [name]) 1 MG tablet Take 1 mg by mouth 3 (three) times daily as needed for anxiety.   Yes Historical Provider, MD  aspirin 81 MG chewable tablet Chew 81 mg by mouth daily.   Yes Historical Provider, MD  calamine lotion Apply 1 application topically as needed for itching.   Yes Historical Provider, MD  clopidogrel (PLAVIX) 75 MG tablet Take 75 mg by mouth daily.   Yes Historical Provider, MD  diphenhydrAMINE (BENADRYL) 25 MG tablet Take 25-50 mg by mouth every 8 (eight) hours as needed for itching.   Yes Historical Provider, MD  fluticasone (FLONASE) 50 MCG/ACT nasal spray Place 2 sprays into both nostrils daily.   Yes Historical Provider, MD  Fluticasone-Salmeterol (ADVAIR) 250-50 MCG/DOSE AEPB Inhale 1 puff into the lungs 2 (two) times daily.   Yes Historical Provider, MD  gabapentin (NEURONTIN) 800 MG tablet Take 800 mg by mouth 3 (three) times daily.   Yes Historical Provider, MD  lisinopril (PRINIVIL,ZESTRIL) 10 MG tablet Take 1 tablet (10 mg total) by mouth daily. 10/20/14  Yes [name], PA-C  metFORMIN (GLUCOPHAGE) 500 MG tablet Take 500 mg by mouth daily with breakfast.   Yes Historical Provider, MD  metoprolol tartrate (LOPRESSOR) 25  MG tablet Take 1 tablet (25 mg total) by mouth once. 10/20/14  Yes [name], PA-C  pravastatin (PRAVACHOL) 40 MG tablet Take 1 tablet (40 mg total) by mouth daily. 10/20/14  Yes [name], PA-C  cephALEXin (KEFLEX) 500 MG capsule Take 1 capsule (500 mg total) by mouth 4 (four) times daily. Patient not taking: Reported on 12/29/2014 12/03/14   Elpidio Anis, PA-C  cephALEXin (KEFLEX) 500 MG capsule Take 1 capsule (500 mg total) by mouth 4 (four) times daily. Patient not taking: Reported on 12/29/2014 12/03/14   Raeford Razor, MD   BP 109/77 mmHg  Pulse 79  Temp(Src) 98.2 F (36.8 C) (Oral)  Resp 18  SpO2 96% Physical Exam  Constitutional: He is oriented to person, place, and time. He appears well-developed and well-nourished. No distress.  HENT:  Head: Normocephalic and atraumatic.  Mouth/Throat: Oropharynx is clear and moist.  Eyes: Pupils are equal, round, and reactive to light.  Neck: Normal range of motion. Neck supple.  Cardiovascular: Normal rate, regular rhythm, normal heart sounds and intact distal pulses.  Exam reveals no gallop and no friction rub.   No murmur heard. Pulmonary/Chest: Effort normal and breath sounds normal. No respiratory distress.  Abdominal: Soft. Bowel sounds are normal. He exhibits no distension. There is no tenderness. There is no rebound and no guarding.  Neurological: He is alert and oriented to person, place, and time. He exhibits normal muscle tone. Coordination normal.  Skin: Skin is warm and dry. No rash noted. No erythema.  Psychiatric: He has a normal mood and affect. His behavior is normal.  Nursing note and vitals reviewed.   ED Course  Procedures (including critical care time) Labs Review Labs Reviewed  COMPREHENSIVE METABOLIC PANEL - Abnormal; Notable for the following:    Glucose, Bld 110 (*)    Calcium 8.6 (*)    All other components within normal limits  URINALYSIS, ROUTINE W REFLEX MICROSCOPIC (NOT AT [facility]) - Abnormal; Notable for the following:    APPearance TURBID (*)    Hgb urine dipstick LARGE (*)    Protein, ur 100 (*)    Nitrite POSITIVE (*)    Leukocytes, UA MODERATE (*)    All other components within normal limits  URINE MICROSCOPIC-ADD ON - Abnormal; Notable for the following:    Squamous Epithelial / LPF MANY (*)    Bacteria, UA MANY (*)    All other components within normal limits  URINE CULTURE  CBC    Imaging Review Ct Abdomen Pelvis Wo Contrast  12/29/2014   CLINICAL DATA:  Right flank pain and hematuria. Nausea and  vomiting the night of 12/28/2014.  EXAM: CT ABDOMEN AND PELVIS WITHOUT CONTRAST  TECHNIQUE: Multidetector CT imaging of the abdomen and pelvis was performed following the standard protocol without IV contrast.  COMPARISON:  CT abdomen and pelvis 08/09/2014.  FINDINGS: The lung bases are clear.  No pleural or pericardial effusion.  A single punctate nonobstructing stone is seen in the lower pole of the left kidney. No other urinary tract stones are identified. There is no hydronephrosis on the right or left.  The liver is diffusely low attenuating consistent with fatty infiltration. The gallbladder, adrenal glands, spleen and pancreas appear normal. Prostate calcifications are noted. The stomach and small and large bowel appear normal. The patient is status post appendectomy.  No lytic or sclerotic bone lesion is identified. Lower thoracic and upper lumbar spondylosis is noted.  IMPRESSION: Single punctate nonobstructing stone lower pole left kidney.  Negative for hydronephrosis or ureteral stone.  Fatty infiltration of liver.   Electronically Signed   By: Drusilla Kanner M.D.   On: 12/29/2014 14:12    I advised the patient and his test results and all questions were answered.  Advised him that he will need close urological follow-up.  Patient agrees the plan and all questions were answered.  Patient agrees to the plan    Charlestine Night, PA-C 12/29/14 1547  Linwood Dibbles, MD 12/31/14 [phone]

## 2014-12-29 NOTE — ED Notes (Signed)
Pt returned from CT °

## 2014-12-30 LAB — URINE CULTURE

## 2015-01-28 ENCOUNTER — Encounter (HOSPITAL_COMMUNITY): Payer: Self-pay | Admitting: *Deleted

## 2015-01-28 DIAGNOSIS — R1032 Left lower quadrant pain: Secondary | ICD-10-CM | POA: Insufficient documentation

## 2015-01-28 DIAGNOSIS — I10 Essential (primary) hypertension: Secondary | ICD-10-CM | POA: Insufficient documentation

## 2015-01-28 DIAGNOSIS — I2510 Atherosclerotic heart disease of native coronary artery without angina pectoris: Secondary | ICD-10-CM | POA: Insufficient documentation

## 2015-01-28 DIAGNOSIS — R300 Dysuria: Secondary | ICD-10-CM | POA: Insufficient documentation

## 2015-01-28 DIAGNOSIS — E669 Obesity, unspecified: Secondary | ICD-10-CM | POA: Insufficient documentation

## 2015-01-28 DIAGNOSIS — Z720 Tobacco use: Secondary | ICD-10-CM | POA: Insufficient documentation

## 2015-01-28 DIAGNOSIS — R319 Hematuria, unspecified: Secondary | ICD-10-CM | POA: Insufficient documentation

## 2015-01-28 DIAGNOSIS — J45909 Unspecified asthma, uncomplicated: Secondary | ICD-10-CM | POA: Insufficient documentation

## 2015-01-28 LAB — URINALYSIS, ROUTINE W REFLEX MICROSCOPIC
Bilirubin Urine: NEGATIVE
GLUCOSE, UA: NEGATIVE mg/dL
Ketones, ur: NEGATIVE mg/dL
Nitrite: POSITIVE — AB
Protein, ur: 30 mg/dL — AB
SPECIFIC GRAVITY, URINE: 1.019 (ref 1.005–1.030)
Urobilinogen, UA: 0.2 mg/dL (ref 0.0–1.0)
pH: 5.5 (ref 5.0–8.0)

## 2015-01-28 LAB — URINE MICROSCOPIC-ADD ON

## 2015-01-28 MED ORDER — OXYCODONE-ACETAMINOPHEN 5-325 MG PO TABS
ORAL_TABLET | ORAL | Status: AC
  Filled 2015-01-28: qty 1

## 2015-01-28 MED ORDER — OXYCODONE-ACETAMINOPHEN 5-325 MG PO TABS
1.0000 | ORAL_TABLET | Freq: Once | ORAL | Status: AC
  Administered 2015-01-28: 1 via ORAL

## 2015-01-28 NOTE — ED Notes (Signed)
Pt c/o worsening left flank pain starting this morning. States he has had many kidney stones and this feels the same. C/o difficulty urinating and painful urination. Also states that there is blood in his urine.

## 2015-01-29 ENCOUNTER — Emergency Department (HOSPITAL_COMMUNITY)
Admission: EM | Admit: 2015-01-29 | Discharge: 2015-01-29 | Discharge status: Against Medical Advice | Payer: Medicaid - Out of State | Attending: Emergency Medicine | Admitting: Emergency Medicine

## 2015-01-29 NOTE — ED Notes (Signed)
No answer when called 

## 2015-01-29 NOTE — ED Notes (Signed)
Called for a room.  No answer x1

## 2015-02-07 ENCOUNTER — Encounter (HOSPITAL_COMMUNITY): Payer: Self-pay | Admitting: Family Medicine

## 2015-02-07 ENCOUNTER — Emergency Department (HOSPITAL_COMMUNITY): Payer: Medicaid - Out of State

## 2015-02-07 ENCOUNTER — Emergency Department (HOSPITAL_COMMUNITY)
Admission: EM | Admit: 2015-02-07 | Discharge: 2015-02-07 | Disposition: A | Discharge status: Home / Self Care | Payer: Self-pay | Attending: Emergency Medicine | Admitting: Emergency Medicine

## 2015-02-07 DIAGNOSIS — G43109 Migraine with aura, not intractable, without status migrainosus: Secondary | ICD-10-CM | POA: Insufficient documentation

## 2015-02-07 DIAGNOSIS — F329 Major depressive disorder, single episode, unspecified: Secondary | ICD-10-CM | POA: Insufficient documentation

## 2015-02-07 DIAGNOSIS — N390 Urinary tract infection, site not specified: Secondary | ICD-10-CM | POA: Insufficient documentation

## 2015-02-07 DIAGNOSIS — E669 Obesity, unspecified: Secondary | ICD-10-CM | POA: Insufficient documentation

## 2015-02-07 DIAGNOSIS — Z9889 Other specified postprocedural states: Secondary | ICD-10-CM | POA: Insufficient documentation

## 2015-02-07 DIAGNOSIS — Z8719 Personal history of other diseases of the digestive system: Secondary | ICD-10-CM | POA: Insufficient documentation

## 2015-02-07 DIAGNOSIS — Z7982 Long term (current) use of aspirin: Secondary | ICD-10-CM | POA: Insufficient documentation

## 2015-02-07 DIAGNOSIS — I2510 Atherosclerotic heart disease of native coronary artery without angina pectoris: Secondary | ICD-10-CM | POA: Insufficient documentation

## 2015-02-07 DIAGNOSIS — M1990 Unspecified osteoarthritis, unspecified site: Secondary | ICD-10-CM | POA: Insufficient documentation

## 2015-02-07 DIAGNOSIS — Z79899 Other long term (current) drug therapy: Secondary | ICD-10-CM | POA: Insufficient documentation

## 2015-02-07 DIAGNOSIS — I10 Essential (primary) hypertension: Secondary | ICD-10-CM | POA: Insufficient documentation

## 2015-02-07 DIAGNOSIS — Z9049 Acquired absence of other specified parts of digestive tract: Secondary | ICD-10-CM | POA: Insufficient documentation

## 2015-02-07 DIAGNOSIS — Z720 Tobacco use: Secondary | ICD-10-CM | POA: Insufficient documentation

## 2015-02-07 DIAGNOSIS — J45909 Unspecified asthma, uncomplicated: Secondary | ICD-10-CM | POA: Insufficient documentation

## 2015-02-07 LAB — URINE MICROSCOPIC-ADD ON

## 2015-02-07 LAB — URINALYSIS, ROUTINE W REFLEX MICROSCOPIC
Bilirubin Urine: NEGATIVE
Glucose, UA: NEGATIVE mg/dL
KETONES UR: NEGATIVE mg/dL
NITRITE: POSITIVE — AB
PROTEIN: 30 mg/dL — AB
Specific Gravity, Urine: 1.014 (ref 1.005–1.030)
UROBILINOGEN UA: 0.2 mg/dL (ref 0.0–1.0)
pH: 6.5 (ref 5.0–8.0)

## 2015-02-07 LAB — BASIC METABOLIC PANEL
Anion gap: 8 (ref 5–15)
BUN: 13 mg/dL (ref 6–20)
CHLORIDE: 107 mmol/L (ref 101–111)
CO2: 24 mmol/L (ref 22–32)
Calcium: 8.9 mg/dL (ref 8.9–10.3)
Creatinine, Ser: 0.94 mg/dL (ref 0.61–1.24)
Glucose, Bld: 144 mg/dL — ABNORMAL HIGH (ref 65–99)
POTASSIUM: 3.8 mmol/L (ref 3.5–5.1)
Sodium: 139 mmol/L (ref 135–145)

## 2015-02-07 LAB — CBC WITH DIFFERENTIAL/PLATELET
BASOS PCT: 0 % (ref 0–1)
Basophils Absolute: 0 10*3/uL (ref 0.0–0.1)
Eosinophils Absolute: 0.2 10*3/uL (ref 0.0–0.7)
Eosinophils Relative: 2 % (ref 0–5)
HCT: 44.1 % (ref 39.0–52.0)
HEMOGLOBIN: 14.4 g/dL (ref 13.0–17.0)
LYMPHS ABS: 1.7 10*3/uL (ref 0.7–4.0)
Lymphocytes Relative: 18 % (ref 12–46)
MCH: 28.1 pg (ref 26.0–34.0)
MCHC: 32.7 g/dL (ref 30.0–36.0)
MCV: 86 fL (ref 78.0–100.0)
Monocytes Absolute: 0.9 10*3/uL (ref 0.1–1.0)
Monocytes Relative: 9 % (ref 3–12)
NEUTROS PCT: 71 % (ref 43–77)
Neutro Abs: 6.9 10*3/uL (ref 1.7–7.7)
PLATELETS: 293 10*3/uL (ref 150–400)
RBC: 5.13 MIL/uL (ref 4.22–5.81)
RDW: 14.2 % (ref 11.5–15.5)
WBC: 9.7 10*3/uL (ref 4.0–10.5)

## 2015-02-07 MED ORDER — PROMETHAZINE HCL 25 MG PO TABS: 25.0000 mg | ORAL_TABLET | Freq: Four times a day (QID) | ORAL | Status: DC | PRN

## 2015-02-07 MED ORDER — CEPHALEXIN 500 MG PO CAPS: 500.0000 mg | ORAL_CAPSULE | Freq: Three times a day (TID) | ORAL | Status: DC

## 2015-02-07 MED ORDER — KETOROLAC TROMETHAMINE 30 MG/ML IJ SOLN
30.0000 mg | Freq: Once | INTRAMUSCULAR | Status: AC
  Administered 2015-02-07: 30 mg via INTRAVENOUS
  Filled 2015-02-07: qty 1

## 2015-02-07 MED ORDER — ONDANSETRON HCL 4 MG/2ML IJ SOLN
4.0000 mg | Freq: Once | INTRAMUSCULAR | Status: AC
  Administered 2015-02-07: 4 mg via INTRAVENOUS
  Filled 2015-02-07: qty 2

## 2015-02-07 MED ORDER — FENTANYL CITRATE (PF) 100 MCG/2ML IJ SOLN
50.0000 ug | Freq: Once | INTRAMUSCULAR | Status: AC
  Administered 2015-02-07: 50 ug via INTRAVENOUS
  Filled 2015-02-07: qty 2

## 2015-02-07 MED ORDER — NAPROXEN 500 MG PO TABS: 500.0000 mg | ORAL_TABLET | Freq: Two times a day (BID) | ORAL | Status: DC

## 2015-02-07 MED ORDER — DEXTROSE 5 % IV SOLN
1.0000 g | Freq: Once | INTRAVENOUS | Status: AC
  Administered 2015-02-07: 1 g via INTRAVENOUS
  Filled 2015-02-07: qty 10

## 2015-02-07 NOTE — Discharge Instructions (Signed)
Please follow up with your primary care physician in 1-2 days. If you do not have one please call the [facility] and [facility] number listed above. Please take your antibiotic until completion. You had no ureteral stones on your ultrasound. You pain is likely due to infection. Please read all discharge instructions and return precautions.   Urinary Tract Infection Urinary tract infections (UTIs) can develop anywhere along your urinary tract. Your urinary tract is your body's drainage system for removing wastes and extra water. Your urinary tract includes two kidneys, two ureters, a bladder, and a urethra. Your kidneys are a pair of bean-shaped organs. Each kidney is about the size of your fist. They are located below your ribs, one on each side of your spine. CAUSES Infections are caused by microbes, which are microscopic organisms, including fungi, viruses, and bacteria. These organisms are so small that they can only be seen through a microscope. Bacteria are the microbes that most commonly cause UTIs. SYMPTOMS  Symptoms of UTIs may vary by age and gender of the patient and by the location of the infection. Symptoms in young women typically include a frequent and intense urge to urinate and a painful, burning feeling in the bladder or urethra during urination. Older women and men are more likely to be tired, shaky, and weak and have muscle aches and abdominal pain. A fever may mean the infection is in your kidneys. Other symptoms of a kidney infection include pain in your back or sides below the ribs, nausea, and vomiting. DIAGNOSIS To diagnose a UTI, your caregiver will ask you about your symptoms. Your caregiver also will ask to provide a urine sample. The urine sample will be tested for bacteria and white blood cells. White blood cells are made by your body to help fight infection. TREATMENT  Typically, UTIs can be treated with medication. Because most UTIs are caused by a bacterial infection,  they usually can be treated with the use of antibiotics. The choice of antibiotic and length of treatment depend on your symptoms and the type of bacteria causing your infection. HOME CARE INSTRUCTIONS  If you were prescribed antibiotics, take them exactly as your caregiver instructs you. Finish the medication even if you feel better after you have only taken some of the medication.  Drink enough water and fluids to keep your urine clear or pale yellow.  Avoid caffeine, tea, and carbonated beverages. They tend to irritate your bladder.  Empty your bladder often. Avoid holding urine for long periods of time.  Empty your bladder before and after sexual intercourse.  After a bowel movement, women should cleanse from front to back. Use each tissue only once. SEEK MEDICAL CARE IF:   You have back pain.  You develop a fever.  Your symptoms do not begin to resolve within 3 days. SEEK IMMEDIATE MEDICAL CARE IF:   You have severe back pain or lower abdominal pain.  You develop chills.  You have nausea or vomiting.  You have continued burning or discomfort with urination. MAKE SURE YOU:   Understand these instructions.  Will watch your condition.  Will get help right away if you are not doing well or get worse. Document Released: 03/05/2005 Document Revised: 11/25/2011 Document Reviewed: 07/04/2011 [facility] Patient Information 2015 [place], [place]. This information is not intended to replace advice given to you by your health care provider. Make sure you discuss any questions you have with your health care provider.

## 2015-02-07 NOTE — ED Notes (Addendum)
Pt aware of sterile urine sample needed from in and out cath, pt refusing. Irving Burton RN at bedside, [place] PA aware,.

## 2015-02-07 NOTE — ED Provider Notes (Signed)
CSN: [id number]     Arrival date & time 02/07/15  1109 History   First MD Initiated Contact with Patient 02/07/15 1114     Chief Complaint  Patient presents with  . Flank Pain     (Consider location/radiation/quality/duration/timing/severity/associated sxs/prior Treatment) HPI Comments: Patient is a 44 yo M presenting to the emergency department for evaluation of 2 days of worsening right-sided flank pain. He endorses nausea, vomiting and hematuria that began today. He has tried over-the-counter medications without improvement. Endorses a history of kidney stones, states feels similar. Denies any fever. No history of abdominal surgery.   Patient is a 44 y.o. male presenting with flank pain.  Flank Pain This is a new problem. The current episode started yesterday. The problem occurs constantly. The problem has been gradually worsening. Associated symptoms include nausea, urinary symptoms and vomiting. Nothing aggravates the symptoms. He has tried acetaminophen and NSAIDs for the symptoms. The treatment provided no relief.    Past Medical History  Diagnosis Date  . Hypertension   . Obesity   . Arthritis   . Renal disorder   . Depression   . Migraine aura, persistent     since childhood  . Head ache   . Coronary artery disease   . GERD (gastroesophageal reflux disease)   . Asthma    Past Surgical History  Procedure Laterality Date  . Cardiac catheterization      x2  . Appendectomy     Family History  Problem Relation Age of Onset  . Hypertension Mother   . Hyperlipidemia Mother   . Hypertension Brother    Social History  Substance Use Topics  . Smoking status: Current Every Day Smoker  . Smokeless tobacco: None  . Alcohol Use: No    Review of Systems  Gastrointestinal: Positive for nausea and vomiting.  Genitourinary: Positive for hematuria and flank pain.  All other systems reviewed and are negative.     Allergies  Aspartame and phenylalanine; Bee venom;  Doxycycline; and Trazodone and nefazodone  Home Medications   Prior to Admission medications   Medication Sig Start Date End Date Taking? Authorizing Provider  albuterol (PROVENTIL HFA;VENTOLIN HFA) 108 (90 BASE) MCG/ACT inhaler Inhale 2 puffs into the lungs every 4 (four) hours as needed for wheezing or shortness of breath. 08/09/14  Yes Pricilla Loveless, MD  ALPRAZolam [name]) 1 MG tablet Take 1 mg by mouth 3 (three) times daily as needed for anxiety.   Yes Historical Provider, MD  aspirin 81 MG chewable tablet Chew 81 mg by mouth daily.   Yes Historical Provider, MD  calamine lotion Apply 1 application topically as needed for itching.   Yes Historical Provider, MD  diphenhydrAMINE (BENADRYL) 25 MG tablet Take 25-50 mg by mouth every 8 (eight) hours as needed for itching.   Yes Historical Provider, MD  fluticasone (FLONASE) 50 MCG/ACT nasal spray Place 2 sprays into both nostrils daily.   Yes Historical Provider, MD  Fluticasone-Salmeterol (ADVAIR) 250-50 MCG/DOSE AEPB Inhale 1 puff into the lungs 2 (two) times daily.   Yes Historical Provider, MD  gabapentin (NEURONTIN) 800 MG tablet Take 800 mg by mouth 3 (three) times daily.   Yes Historical Provider, MD  lisinopril (PRINIVIL,ZESTRIL) 10 MG tablet Take 1 tablet (10 mg total) by mouth daily. 10/20/14  Yes [name], PA-C  metFORMIN (GLUCOPHAGE) 500 MG tablet Take 500 mg by mouth daily with breakfast.   Yes Historical Provider, MD  metoprolol tartrate (LOPRESSOR) 25 MG tablet Take 1 tablet (  25 mg total) by mouth once. Patient taking differently: Take 25 mg by mouth 2 (two) times daily.  10/20/14  Yes [name], PA-C  pravastatin (PRAVACHOL) 40 MG tablet Take 1 tablet (40 mg total) by mouth daily. 10/20/14  Yes [name], PA-C  cephALEXin (KEFLEX) 500 MG capsule Take 1 capsule (500 mg total) by mouth 3 (three) times daily. 02/07/15   Legrand Lasser, PA-C  naproxen (NAPROSYN) 500 MG tablet Take 1 tablet (500 mg total) by mouth 2 (two)  times daily with a meal. 02/07/15   [name], PA-C  promethazine (PHENERGAN) 25 MG tablet Take 1 tablet (25 mg total) by mouth every 6 (six) hours as needed for nausea or vomiting. 02/07/15   Victorino Dike Na [place]rip, PA-C   BP 137/88 mmHg  Pulse 64  Temp(Src) 99.5 F (37.5 C) (Oral)  Resp 22  SpO2 93% Physical Exam  Constitutional: He is oriented to person, place, and time. He appears well-developed and well-nourished. No distress.  HENT:  Head: Normocephalic and atraumatic.  Right Ear: External ear normal.  Left Ear: External ear normal.  Nose: Nose normal.  Mouth/Throat: Oropharynx is clear and moist.  Eyes: Conjunctivae are normal.  Neck: Normal range of motion. Neck supple.  No nuchal rigidity.   Cardiovascular: Normal rate, regular rhythm and normal heart sounds.   Pulmonary/Chest: Effort normal and breath sounds normal.  Abdominal: Soft. Bowel sounds are normal. There is no tenderness.  Musculoskeletal: Normal range of motion.  Neurological: He is alert and oriented to person, place, and time.  Skin: Skin is warm and dry. He is not diaphoretic.  Psychiatric: He has a normal mood and affect.  Nursing note and vitals reviewed.   ED Course  Procedures (including critical care time) Medications  ketorolac (TORADOL) 30 MG/ML injection 30 mg (30 mg Intravenous Given 02/07/15 1202)  ondansetron (ZOFRAN) injection 4 mg (4 mg Intravenous Given 02/07/15 1202)  cefTRIAXone (ROCEPHIN) 1 g in dextrose 5 % 50 mL IVPB (0 g Intravenous Stopped 02/07/15 1518)  fentaNYL (SUBLIMAZE) injection 50 mcg (50 mcg Intravenous Given 02/07/15 1447)    Labs Review Labs Reviewed  BASIC METABOLIC PANEL - Abnormal; Notable for the following:    Glucose, Bld 144 (*)    All other components within normal limits  URINALYSIS, ROUTINE W REFLEX MICROSCOPIC (NOT AT [facility]) - Abnormal; Notable for the following:    APPearance TURBID (*)    Hgb urine dipstick LARGE (*)    Protein, ur 30 (*)    Nitrite  POSITIVE (*)    Leukocytes, UA MODERATE (*)    All other components within normal limits  URINE MICROSCOPIC-ADD ON - Abnormal; Notable for the following:    Squamous Epithelial / LPF MANY (*)    Bacteria, UA MANY (*)    Casts GRANULAR CAST (*)    All other components within normal limits  URINE CULTURE  CBC WITH DIFFERENTIAL/PLATELET    Imaging Review US Renal  02/07/2015   CLINICAL DATA:  44 year old male with right flank pain for 2 days. Nephrolithiasis. Initial encounter.  EXAM: RENAL / URINARY TRACT ULTRASOUND COMPLETE  COMPARISON:  CT Abdomen and Pelvis 12/29/2014  FINDINGS: Right Kidney:  Length: 11.8 cm. Possible 6 mm nonobstructing calculus at the mid pole (image 3). Echogenicity within normal limits. No mass or hydronephrosis visualized.  Left Kidney:  Length: 11.8 cm. Suggestion of 8 mm nonobstructing calculus at the midpole. Echogenicity within normal limits. No mass or hydronephrosis visualized.  Bladder:  Appears normal for degree  of bladder distention.  IMPRESSION: No evidence of obstructive uropathy. Bilateral nephrolithiasis suspected.   Electronically Signed   By: Odessa Fleming M.D.   On: 02/07/2015 14:24   I have personally reviewed and evaluated these images and lab results as part of my medical decision-making.   EKG Interpretation None      Patient with numerous visits to ED for evaluation of possible kidney stones. Review of urine culture shows no culture growth.  MDM   Final diagnoses:  UTI (lower urinary tract infection)    Filed Vitals:   02/07/15 1445  BP: 137/88  Pulse: 64  Temp:   Resp:    Afebrile, NAD, non-toxic appearing, AAOx4.    Pt has been diagnosed with a UTI. Pt is afebrile, no CVA tenderness, normotensive, and denies N/V. Bilateral nephrolithiasis noted without hydronephrosis. Pt to be dc home with antibiotics and instructions to follow up with PCP and/or urologist if symptoms persist. Patient is stable at time of discharge. Patient d/w with Dr.  Rubin Payor, agrees with plan.      [name], PA-C 02/07/15 1912  Benjiman Core, MD 02/08/15 [phone]

## 2015-02-07 NOTE — ED Notes (Signed)
Spoke with patient regarding need for In and Out Cath. Pt continues to decline.

## 2015-02-07 NOTE — ED Notes (Signed)
Pt presents with right sided flank pain that began 2 days ago.  Reports history of kidney stones and states this feels similar.  Pt also reports NV and hematuria today.  Pt appears in significant pain.

## 2015-02-08 LAB — URINE CULTURE

## 2015-02-24 ENCOUNTER — Emergency Department (HOSPITAL_COMMUNITY): Payer: Medicaid - Out of State

## 2015-02-24 ENCOUNTER — Emergency Department (HOSPITAL_COMMUNITY)
Admission: EM | Admit: 2015-02-24 | Discharge: 2015-02-24 | Disposition: A | Discharge status: Home / Self Care | Payer: Medicaid - Out of State | Attending: Emergency Medicine | Admitting: Emergency Medicine

## 2015-02-24 ENCOUNTER — Encounter (HOSPITAL_COMMUNITY): Payer: Self-pay

## 2015-02-24 DIAGNOSIS — Z79899 Other long term (current) drug therapy: Secondary | ICD-10-CM | POA: Insufficient documentation

## 2015-02-24 DIAGNOSIS — Z7951 Long term (current) use of inhaled steroids: Secondary | ICD-10-CM | POA: Insufficient documentation

## 2015-02-24 DIAGNOSIS — G43509 Persistent migraine aura without cerebral infarction, not intractable, without status migrainosus: Secondary | ICD-10-CM | POA: Insufficient documentation

## 2015-02-24 DIAGNOSIS — K219 Gastro-esophageal reflux disease without esophagitis: Secondary | ICD-10-CM | POA: Insufficient documentation

## 2015-02-24 DIAGNOSIS — Z720 Tobacco use: Secondary | ICD-10-CM | POA: Insufficient documentation

## 2015-02-24 DIAGNOSIS — I10 Essential (primary) hypertension: Secondary | ICD-10-CM | POA: Insufficient documentation

## 2015-02-24 DIAGNOSIS — Z792 Long term (current) use of antibiotics: Secondary | ICD-10-CM | POA: Insufficient documentation

## 2015-02-24 DIAGNOSIS — R1013 Epigastric pain: Secondary | ICD-10-CM | POA: Insufficient documentation

## 2015-02-24 DIAGNOSIS — R51 Headache: Secondary | ICD-10-CM

## 2015-02-24 DIAGNOSIS — Z87448 Personal history of other diseases of urinary system: Secondary | ICD-10-CM | POA: Insufficient documentation

## 2015-02-24 DIAGNOSIS — Z791 Long term (current) use of non-steroidal anti-inflammatories (NSAID): Secondary | ICD-10-CM | POA: Insufficient documentation

## 2015-02-24 DIAGNOSIS — Z7982 Long term (current) use of aspirin: Secondary | ICD-10-CM | POA: Insufficient documentation

## 2015-02-24 DIAGNOSIS — Z9889 Other specified postprocedural states: Secondary | ICD-10-CM | POA: Insufficient documentation

## 2015-02-24 DIAGNOSIS — R079 Chest pain, unspecified: Secondary | ICD-10-CM

## 2015-02-24 DIAGNOSIS — R519 Headache, unspecified: Secondary | ICD-10-CM

## 2015-02-24 DIAGNOSIS — R42 Dizziness and giddiness: Secondary | ICD-10-CM

## 2015-02-24 DIAGNOSIS — F419 Anxiety disorder, unspecified: Secondary | ICD-10-CM | POA: Insufficient documentation

## 2015-02-24 DIAGNOSIS — J45901 Unspecified asthma with (acute) exacerbation: Secondary | ICD-10-CM | POA: Insufficient documentation

## 2015-02-24 DIAGNOSIS — F329 Major depressive disorder, single episode, unspecified: Secondary | ICD-10-CM | POA: Insufficient documentation

## 2015-02-24 DIAGNOSIS — M1990 Unspecified osteoarthritis, unspecified site: Secondary | ICD-10-CM | POA: Insufficient documentation

## 2015-02-24 DIAGNOSIS — I2510 Atherosclerotic heart disease of native coronary artery without angina pectoris: Secondary | ICD-10-CM | POA: Insufficient documentation

## 2015-02-24 LAB — I-STAT TROPONIN, ED: Troponin i, poc: 0 ng/mL (ref 0.00–0.08)

## 2015-02-24 LAB — BASIC METABOLIC PANEL
Anion gap: 10 (ref 5–15)
BUN: 12 mg/dL (ref 6–20)
CHLORIDE: 106 mmol/L (ref 101–111)
CO2: 24 mmol/L (ref 22–32)
CREATININE: 1.07 mg/dL (ref 0.61–1.24)
Calcium: 8.8 mg/dL — ABNORMAL LOW (ref 8.9–10.3)
GFR calc Af Amer: >60 mL/min (ref >60)
GFR calc non Af Amer: >60 mL/min (ref >60)
GLUCOSE: 140 mg/dL — AB (ref 65–99)
Potassium: 4.2 mmol/L (ref 3.5–5.1)
Sodium: 140 mmol/L (ref 135–145)

## 2015-02-24 LAB — CBC
HCT: 44.7 % (ref 39.0–52.0)
Hemoglobin: 14.4 g/dL (ref 13.0–17.0)
MCH: 28 pg (ref 26.0–34.0)
MCHC: 32.2 g/dL (ref 30.0–36.0)
MCV: 86.8 fL (ref 78.0–100.0)
PLATELETS: 292 10*3/uL (ref 150–400)
RBC: 5.15 MIL/uL (ref 4.22–5.81)
RDW: 14.4 % (ref 11.5–15.5)
WBC: 8.6 10*3/uL (ref 4.0–10.5)

## 2015-02-24 MED ORDER — KETOROLAC TROMETHAMINE 30 MG/ML IJ SOLN
30.0000 mg | Freq: Once | INTRAMUSCULAR | Status: AC
  Administered 2015-02-24: 30 mg via INTRAVENOUS
  Filled 2015-02-24: qty 1

## 2015-02-24 MED ORDER — PROCHLORPERAZINE EDISYLATE 5 MG/ML IJ SOLN
10.0000 mg | Freq: Once | INTRAMUSCULAR | Status: AC
  Administered 2015-02-24: 10 mg via INTRAVENOUS
  Filled 2015-02-24: qty 2

## 2015-02-24 MED ORDER — MECLIZINE HCL 25 MG PO TABS
25.0000 mg | ORAL_TABLET | Freq: Once | ORAL | Status: AC
  Administered 2015-02-24: 25 mg via ORAL
  Filled 2015-02-24: qty 1

## 2015-02-24 MED ORDER — DIPHENHYDRAMINE HCL 50 MG/ML IJ SOLN
50.0000 mg | Freq: Once | INTRAMUSCULAR | Status: AC
  Administered 2015-02-24: 50 mg via INTRAVENOUS
  Filled 2015-02-24: qty 1

## 2015-02-24 MED ORDER — SODIUM CHLORIDE 0.9 % IV BOLUS (SEPSIS)
1000.0000 mL | Freq: Once | INTRAVENOUS | Status: AC
  Administered 2015-02-24: 1000 mL via INTRAVENOUS

## 2015-02-24 MED ORDER — LORAZEPAM 2 MG/ML IJ SOLN
0.5000 mg | Freq: Once | INTRAMUSCULAR | Status: AC
  Administered 2015-02-24: 0.5 mg via INTRAVENOUS
  Filled 2015-02-24: qty 1

## 2015-02-24 NOTE — ED Notes (Signed)
Pt reports he woke up this morning with chest pain, dizziness, and nausea. Pt reports he has hx of heart problems. Pt also reports hypertension at home.

## 2015-02-24 NOTE — Discharge Instructions (Signed)

## 2015-02-24 NOTE — ED Provider Notes (Signed)
CSN: [id number]     Arrival date & time 02/24/15  0934 History   First MD Initiated Contact with Patient 02/24/15 [phone]     Chief Complaint  Patient presents with  . Chest Pain  . Shortness of Breath     (Consider location/radiation/quality/duration/timing/severity/associated sxs/prior Treatment) Patient is a 44 y.o. male presenting with chest pain and shortness of breath. The history is provided by the patient.  Chest Pain Pain location:  L chest Pain quality: sharp and shooting   Pain radiates to:  L arm Pain radiates to the back: no   Pain severity:  Severe Onset quality:  Sudden Duration:  2 hours Timing:  Intermittent Progression:  Worsening Chronicity:  Recurrent Context: breathing   Relieved by:  Nothing Worsened by:  Nothing tried Ineffective treatments:  None tried Associated symptoms: shortness of breath   Associated symptoms: no abdominal pain, no fever, no headache, no palpitations and not vomiting   Risk factors: coronary artery disease (per patient had cath with stent in 06, clean cath without stent noted in 2012 here), diabetes mellitus, high cholesterol, hypertension, male sex and smoking   Shortness of Breath Associated symptoms: chest pain   Associated symptoms: no abdominal pain, no fever, no headaches, no rash and no vomiting     44 yo M with a chief complaint also chest pain. This woke the patient up from sleep this morning. Said he has some dizziness and some vomiting associated with it as well. Pain radiates down his left arm is sharp shooting. Patient has a history of coronary artery disease. Patient said he had a stent placed in 06 [facility] in [place]. Patient with some mild shortness of breath associated with [email or web address] also some paresthesias. Patient is also complaining of a headache. States this feels a bit atypical of his prior migraines. Denies any weakness or difficulty speaking.  Past Medical History  Diagnosis Date  .  Hypertension   . Obesity   . Arthritis   . Renal disorder   . Depression   . Migraine aura, persistent     since childhood  . Head ache   . Coronary artery disease   . GERD (gastroesophageal reflux disease)   . Asthma    Past Surgical History  Procedure Laterality Date  . Cardiac catheterization      x2  . Appendectomy     Family History  Problem Relation Age of Onset  . Hypertension Mother   . Hyperlipidemia Mother   . Hypertension Brother    Social History  Substance Use Topics  . Smoking status: Current Every Day Smoker -- 1.00 packs/day  . Smokeless tobacco: None  . Alcohol Use: No    Review of Systems  Constitutional: Negative for fever and chills.  HENT: Negative for congestion and facial swelling.   Eyes: Negative for discharge and visual disturbance.  Respiratory: Positive for shortness of breath.   Cardiovascular: Positive for chest pain. Negative for palpitations.  Gastrointestinal: Negative for vomiting, abdominal pain and diarrhea.  Musculoskeletal: Negative for myalgias and arthralgias.  Skin: Negative for color change and rash.  Neurological: Negative for tremors, syncope and headaches.  Psychiatric/Behavioral: Negative for confusion and dysphoric mood. The patient is nervous/anxious.       Allergies  Aspartame and phenylalanine; Bee venom; Doxycycline; Hornet venom; Phenylalanine; and Trazodone and nefazodone  Home Medications   Prior to Admission medications   Medication Sig Start Date End Date Taking? Authorizing Provider  albuterol (PROVENTIL HFA;VENTOLIN HFA) 108 (90  BASE) MCG/ACT inhaler Inhale 2 puffs into the lungs every 4 (four) hours as needed for wheezing or shortness of breath. 08/09/14   Pricilla Loveless, MD  ALPRAZolam [name]) 1 MG tablet Take 1 mg by mouth 3 (three) times daily as needed for anxiety.    Historical Provider, MD  aspirin 81 MG chewable tablet Chew 81 mg by mouth daily.    Historical Provider, MD  calamine lotion Apply 1  application topically as needed for itching.    Historical Provider, MD  cephALEXin (KEFLEX) 500 MG capsule Take 1 capsule (500 mg total) by mouth 3 (three) times daily. 02/07/15   Jennifer Piepenbrink, PA-C  diphenhydrAMINE (BENADRYL) 25 MG tablet Take 25-50 mg by mouth every 8 (eight) hours as needed for itching.    Historical Provider, MD  fluticasone (FLONASE) 50 MCG/ACT nasal spray Place 2 sprays into both nostrils daily.    Historical Provider, MD  Fluticasone-Salmeterol (ADVAIR) 250-50 MCG/DOSE AEPB Inhale 1 puff into the lungs 2 (two) times daily.    Historical Provider, MD  gabapentin (NEURONTIN) 800 MG tablet Take 800 mg by mouth 3 (three) times daily.    Historical Provider, MD  lisinopril (PRINIVIL,ZESTRIL) 10 MG tablet Take 1 tablet (10 mg total) by mouth daily. 10/20/14   [name], PA-C  metFORMIN (GLUCOPHAGE) 500 MG tablet Take 500 mg by mouth daily with breakfast.    Historical Provider, MD  metoprolol tartrate (LOPRESSOR) 25 MG tablet Take 1 tablet (25 mg total) by mouth once. Patient taking differently: Take 25 mg by mouth 2 (two) times daily.  10/20/14   [name], PA-C  naproxen (NAPROSYN) 500 MG tablet Take 1 tablet (500 mg total) by mouth 2 (two) times daily with a meal. 02/07/15   [name], PA-C  pravastatin (PRAVACHOL) 40 MG tablet Take 1 tablet (40 mg total) by mouth daily. 10/20/14   [name], PA-C  promethazine (PHENERGAN) 25 MG tablet Take 1 tablet (25 mg total) by mouth every 6 (six) hours as needed for nausea or vomiting. 02/07/15   Victorino Dike Piepenbrink, PA-C   BP 126/71 mmHg  Pulse 62  Temp(Src) 97.9 F (36.6 C) (Oral)  Resp 14  Ht 6' (1.829 m)  Wt 410 lb (185.975 kg)  BMI 55.59 kg/m2  SpO2 93% Physical Exam  Constitutional: He is oriented to person, place, and time. He appears well-developed and well-nourished.  Morbidly obese  HENT:  Head: Normocephalic and atraumatic.  Eyes: EOM are normal. Pupils are equal, round, and reactive to  light.  Neck: Normal range of motion. Neck supple. No JVD present.  Cardiovascular: Normal rate and regular rhythm.  Exam reveals no gallop and no friction rub.   No murmur heard. Pulmonary/Chest: No respiratory distress. He has no wheezes. He exhibits tenderness (left lateral chest wall).  tachypnic   Abdominal: He exhibits no distension. There is tenderness (mild epigastric). There is no rebound and no guarding.  Musculoskeletal: Normal range of motion.  Neurological: He is alert and oriented to person, place, and time.  Skin: No rash noted. No pallor.  Psychiatric: He has a normal mood and affect. His behavior is normal.    ED Course  Procedures (including critical care time) Labs Review Labs Reviewed  BASIC METABOLIC PANEL - Abnormal; Notable for the following:    Glucose, Bld 140 (*)    Calcium 8.8 (*)    All other components within normal limits  CBC  I-STAT TROPOININ, ED    Imaging Review Dg Chest 2 View  02/24/2015   CLINICAL DATA:  Left anterior chest pain this morning with nausea and dizziness  EXAM: CHEST  2 VIEW  COMPARISON:  10/05/2014  FINDINGS: Stable mild cardiac enlargement. Vascular pattern normal. Lungs clear. No effusions. Bony thorax intact.  IMPRESSION: No active cardiopulmonary disease.   Electronically Signed   By: Esperanza Heir M.D.   On: 02/24/2015 10:57   I have personally reviewed and evaluated these images and lab results as part of my medical decision-making.   EKG Interpretation   Date/Time:  Saturday February 24 2015 09:39:35 EDT Ventricular Rate:  78 PR Interval:  144 QRS Duration: 104 QT Interval:  375 QTC Calculation: 427 R Axis:   24 Text Interpretation:  Sinus rhythm Low voltage, precordial leads Abnormal  R-wave progression, early transition No significant change since last  tracing Confirmed by FLOYD MD, Reuel Boom ([id number]) on 02/24/2015 9:46:00 AM      MDM   Final diagnoses:  Chest pain, unspecified chest pain type  Anxiety   Vertigo  Nonintractable headache, unspecified chronicity pattern, unspecified headache type    44 yo M with a chief complaint chest pain. Patient states he has a history of a stent, however had a cath in 2012 with no noted coronary disease. No stent was noted on that exam. Patient's symptoms fit more with anxiety as patient is hyperventilating complaining of paresthesias. Pain is reproduced on exam as well with palpation.   EKG unchanged from prior. I-STAT troponin obtained as part of triage process. Negative.  Patient pain likely musculoskeletal.  Clean cath post hx of MI, feel no need for further cardiac workup at this time. Headache and chest pain improved with migraine cocktail.  Patient now complaining of vertigo.  No noted focal findings on neuro exam.  Given toradol, meclizine.    I have discussed the diagnosis/risks/treatment options with the patient and family and believe the pt to be eligible for discharge home to follow-up with PCP. We also discussed returning to the ED immediately if new or worsening sx occur. We discussed the sx which are most concerning (e.g., sudden worsening pain) that necessitate immediate return. Medications administered to the patient during their visit and any new prescriptions provided to the patient are listed below.  Medications given during this visit Medications  prochlorperazine (COMPAZINE) injection 10 mg (10 mg Intravenous Given 02/24/15 1016)  diphenhydrAMINE (BENADRYL) injection 50 mg (50 mg Intravenous Given 02/24/15 1015)  sodium chloride 0.9 % bolus 1,000 mL (0 mLs Intravenous Stopped 02/24/15 1137)  LORazepam (ATIVAN) injection 0.5 mg (0.5 mg Intravenous Given 02/24/15 1015)  ketorolac (TORADOL) 30 MG/ML injection 30 mg (30 mg Intravenous Given 02/24/15 1130)  meclizine (ANTIVERT) tablet 25 mg (25 mg Oral Given 02/24/15 1131)    Discharge Medication List as of 02/24/2015 11:04 AM       The patient appears reasonably screen and/or stabilized for  discharge and I doubt any other medical condition or other [facility] requiring further screening, evaluation, or treatment in the ED at this time prior to discharge.    Melene Plan, DO 02/24/15 1336

## 2015-03-03 ENCOUNTER — Encounter (HOSPITAL_COMMUNITY): Payer: Self-pay

## 2015-03-03 ENCOUNTER — Emergency Department (HOSPITAL_COMMUNITY)
Admission: EM | Admit: 2015-03-03 | Discharge: 2015-03-03 | Disposition: A | Discharge status: Home / Self Care | Payer: Self-pay | Attending: Emergency Medicine | Admitting: Emergency Medicine

## 2015-03-03 ENCOUNTER — Emergency Department (HOSPITAL_COMMUNITY)
Admission: EM | Admit: 2015-03-03 | Discharge: 2015-03-03 | Discharge status: Against Medical Advice | Payer: Medicaid - Out of State | Attending: Emergency Medicine | Admitting: Emergency Medicine

## 2015-03-03 ENCOUNTER — Emergency Department (HOSPITAL_COMMUNITY): Payer: Medicaid - Out of State

## 2015-03-03 ENCOUNTER — Encounter (HOSPITAL_COMMUNITY): Payer: Self-pay | Admitting: Emergency Medicine

## 2015-03-03 DIAGNOSIS — Z7982 Long term (current) use of aspirin: Secondary | ICD-10-CM | POA: Insufficient documentation

## 2015-03-03 DIAGNOSIS — R112 Nausea with vomiting, unspecified: Secondary | ICD-10-CM | POA: Insufficient documentation

## 2015-03-03 DIAGNOSIS — Z720 Tobacco use: Secondary | ICD-10-CM | POA: Insufficient documentation

## 2015-03-03 DIAGNOSIS — Z79899 Other long term (current) drug therapy: Secondary | ICD-10-CM | POA: Insufficient documentation

## 2015-03-03 DIAGNOSIS — R1033 Periumbilical pain: Secondary | ICD-10-CM | POA: Insufficient documentation

## 2015-03-03 DIAGNOSIS — E669 Obesity, unspecified: Secondary | ICD-10-CM | POA: Insufficient documentation

## 2015-03-03 DIAGNOSIS — I2510 Atherosclerotic heart disease of native coronary artery without angina pectoris: Secondary | ICD-10-CM | POA: Insufficient documentation

## 2015-03-03 DIAGNOSIS — Z792 Long term (current) use of antibiotics: Secondary | ICD-10-CM | POA: Insufficient documentation

## 2015-03-03 DIAGNOSIS — R740 Nonspecific elevation of levels of transaminase and lactic acid dehydrogenase [LDH]: Secondary | ICD-10-CM | POA: Insufficient documentation

## 2015-03-03 DIAGNOSIS — R197 Diarrhea, unspecified: Secondary | ICD-10-CM | POA: Insufficient documentation

## 2015-03-03 DIAGNOSIS — I10 Essential (primary) hypertension: Secondary | ICD-10-CM | POA: Insufficient documentation

## 2015-03-03 DIAGNOSIS — Z7951 Long term (current) use of inhaled steroids: Secondary | ICD-10-CM | POA: Insufficient documentation

## 2015-03-03 DIAGNOSIS — F419 Anxiety disorder, unspecified: Secondary | ICD-10-CM | POA: Insufficient documentation

## 2015-03-03 DIAGNOSIS — J45909 Unspecified asthma, uncomplicated: Secondary | ICD-10-CM | POA: Insufficient documentation

## 2015-03-03 DIAGNOSIS — R1010 Upper abdominal pain, unspecified: Secondary | ICD-10-CM | POA: Insufficient documentation

## 2015-03-03 DIAGNOSIS — K219 Gastro-esophageal reflux disease without esophagitis: Secondary | ICD-10-CM | POA: Insufficient documentation

## 2015-03-03 DIAGNOSIS — M1990 Unspecified osteoarthritis, unspecified site: Secondary | ICD-10-CM | POA: Insufficient documentation

## 2015-03-03 DIAGNOSIS — R109 Unspecified abdominal pain: Secondary | ICD-10-CM

## 2015-03-03 DIAGNOSIS — R7989 Other specified abnormal findings of blood chemistry: Secondary | ICD-10-CM

## 2015-03-03 LAB — CBC WITH DIFFERENTIAL/PLATELET
Basophils Absolute: 0 10*3/uL (ref 0.0–0.1)
Basophils Relative: 0 %
EOS PCT: 2 %
Eosinophils Absolute: 0.2 10*3/uL (ref 0.0–0.7)
HEMATOCRIT: 44.5 % (ref 39.0–52.0)
Hemoglobin: 14.3 g/dL (ref 13.0–17.0)
LYMPHS ABS: 2 10*3/uL (ref 0.7–4.0)
LYMPHS PCT: 18 %
MCH: 27.9 pg (ref 26.0–34.0)
MCHC: 32.1 g/dL (ref 30.0–36.0)
MCV: 86.9 fL (ref 78.0–100.0)
MONO ABS: 1.1 10*3/uL — AB (ref 0.1–1.0)
Monocytes Relative: 10 %
NEUTROS ABS: 7.8 10*3/uL — AB (ref 1.7–7.7)
Neutrophils Relative %: 70 %
PLATELETS: 315 10*3/uL (ref 150–400)
RBC: 5.12 MIL/uL (ref 4.22–5.81)
RDW: 14.5 % (ref 11.5–15.5)
WBC: 11.1 10*3/uL — ABNORMAL HIGH (ref 4.0–10.5)

## 2015-03-03 LAB — COMPREHENSIVE METABOLIC PANEL
ALT: 21 U/L (ref 17–63)
AST: 19 U/L (ref 15–41)
Albumin: 3.5 g/dL (ref 3.5–5.0)
Alkaline Phosphatase: 57 U/L (ref 38–126)
Anion gap: 9 (ref 5–15)
BILIRUBIN TOTAL: 0.3 mg/dL (ref 0.3–1.2)
BUN: 16 mg/dL (ref 6–20)
CHLORIDE: 104 mmol/L (ref 101–111)
CO2: 24 mmol/L (ref 22–32)
CREATININE: 1.18 mg/dL (ref 0.61–1.24)
Calcium: 8.5 mg/dL — ABNORMAL LOW (ref 8.9–10.3)
Glucose, Bld: 108 mg/dL — ABNORMAL HIGH (ref 65–99)
POTASSIUM: 4.1 mmol/L (ref 3.5–5.1)
Sodium: 137 mmol/L (ref 135–145)
TOTAL PROTEIN: 7 g/dL (ref 6.5–8.1)

## 2015-03-03 LAB — URINALYSIS, ROUTINE W REFLEX MICROSCOPIC
Bilirubin Urine: NEGATIVE
Glucose, UA: NEGATIVE mg/dL
Hgb urine dipstick: NEGATIVE
Ketones, ur: NEGATIVE mg/dL
Nitrite: NEGATIVE
Protein, ur: NEGATIVE mg/dL
Specific Gravity, Urine: 1.027 (ref 1.005–1.030)
Urobilinogen, UA: 0.2 mg/dL (ref 0.0–1.0)
pH: 5.5 (ref 5.0–8.0)

## 2015-03-03 LAB — URINE MICROSCOPIC-ADD ON

## 2015-03-03 LAB — I-STAT CG4 LACTIC ACID, ED
LACTIC ACID, VENOUS: 2.34 mmol/L — AB (ref 0.5–2.0)
Lactic Acid, Venous: 0.59 mmol/L (ref 0.5–2.0)

## 2015-03-03 LAB — I-STAT TROPONIN, ED: Troponin i, poc: 0 ng/mL (ref 0.00–0.08)

## 2015-03-03 LAB — LIPASE, BLOOD: LIPASE: 41 U/L (ref 22–51)

## 2015-03-03 MED ORDER — SODIUM CHLORIDE 0.9 % IV SOLN
1000.0000 mL | Freq: Once | INTRAVENOUS | Status: AC
  Administered 2015-03-03: 1000 mL via INTRAVENOUS

## 2015-03-03 MED ORDER — ONDANSETRON HCL 4 MG/2ML IJ SOLN
4.0000 mg | Freq: Once | INTRAMUSCULAR | Status: AC
  Administered 2015-03-03: 4 mg via INTRAVENOUS
  Filled 2015-03-03: qty 2

## 2015-03-03 MED ORDER — MORPHINE SULFATE (PF) 4 MG/ML IV SOLN
4.0000 mg | Freq: Once | INTRAVENOUS | Status: AC
  Administered 2015-03-03: 4 mg via INTRAVENOUS
  Filled 2015-03-03: qty 1

## 2015-03-03 MED ORDER — ONDANSETRON 4 MG PO TBDP
ORAL_TABLET | ORAL | Status: AC
  Filled 2015-03-03: qty 1

## 2015-03-03 MED ORDER — PROMETHAZINE HCL 25 MG PO TABS: 25.0000 mg | ORAL_TABLET | Freq: Four times a day (QID) | ORAL | Status: DC | PRN

## 2015-03-03 MED ORDER — LOPERAMIDE HCL 2 MG PO CAPS
4.0000 mg | ORAL_CAPSULE | Freq: Once | ORAL | Status: AC
  Administered 2015-03-03: 4 mg via ORAL
  Filled 2015-03-03: qty 2

## 2015-03-03 MED ORDER — IOHEXOL 300 MG/ML  SOLN
100.0000 mL | Freq: Once | INTRAMUSCULAR | Status: AC | PRN
  Administered 2015-03-03: 100 mL via INTRAVENOUS

## 2015-03-03 MED ORDER — ONDANSETRON 4 MG PO TBDP
4.0000 mg | ORAL_TABLET | Freq: Once | ORAL | Status: AC | PRN
  Administered 2015-03-03: 4 mg via ORAL

## 2015-03-03 MED ORDER — SODIUM CHLORIDE 0.9 % IV SOLN
1000.0000 mL | INTRAVENOUS | Status: DC
  Administered 2015-03-03: 1000 mL via INTRAVENOUS

## 2015-03-03 MED ORDER — DIPHENOXYLATE-ATROPINE 2.5-0.025 MG PO TABS: 2.0000 | ORAL_TABLET | Freq: Four times a day (QID) | ORAL | Status: DC | PRN

## 2015-03-03 NOTE — Discharge Instructions (Signed)

## 2015-03-03 NOTE — ED Notes (Signed)
Called for pt 2x, no response 

## 2015-03-03 NOTE — ED Notes (Signed)
Pt reports he can take zofran and had it earlier this morning with no problems.

## 2015-03-03 NOTE — ED Notes (Signed)
Patient here with abdominal pain for the last 4-5 days, with nausea and vomiting and diarrhea.  Patient states that the pain is getting worse in the last day.  Patient states that he has been having 5-6 bouts of vomiting with 4-5 bouts of diarrhea.  Patient continues with nausea and pain at this time.

## 2015-03-03 NOTE — ED Provider Notes (Signed)
Patient signed out to me by Dr. Preston Fleeting. Patient x-rays reviewed and no signs of intra-abdominal process. Likely viral etiology of symptoms. Will prescribed Zofran and Lomotil  [name], MD 03/03/15 1002

## 2015-03-03 NOTE — ED Notes (Signed)
Patient complains 4-5 days  with N/V/D.  Complains of stabbing pain on 6 on 0-10 with continued nausea. Discussed plan with patient.

## 2015-03-03 NOTE — ED Provider Notes (Signed)
CSN: [id number]     Arrival date & time 03/03/15  0450 History   First MD Initiated Contact with Patient 03/03/15 0536     Chief Complaint  Patient presents with  . Abdominal Pain  . Nausea  . Emesis     (Consider location/radiation/quality/duration/timing/severity/associated sxs/prior Treatment) Patient is a 44 y.o. male presenting with abdominal pain and vomiting. The history is provided by the patient.  Abdominal Pain Associated symptoms: vomiting   Emesis Associated symptoms: abdominal pain   He comes in with three-day history of periumbilical pain, vomiting, diarrhea. He has vomited numerous times and had numerous loose bowel movements. He denies blood in stool or emesis. Pain is sharp and nonradiating and he rates it at 8/10. He denies fever but has had chills and sweats. He has tried taking Pepto-Bismol without any relief. He denies any sick contacts. Denies arthralgias or myalgias. Nothing makes his pain better nothing makes it worse. Specifically, it is not affected by vomiting, bowel movement, or eating. However, when he eats, he will have vomiting and diarrhea shortly afterwards.  Past Medical History  Diagnosis Date  . Hypertension   . Obesity   . Arthritis   . Renal disorder   . Depression   . Migraine aura, persistent     since childhood  . Head ache   . Coronary artery disease   . GERD (gastroesophageal reflux disease)   . Asthma    Past Surgical History  Procedure Laterality Date  . Cardiac catheterization      x2  . Appendectomy     Family History  Problem Relation Age of Onset  . Hypertension Mother   . Hyperlipidemia Mother   . Hypertension Brother    Social History  Substance Use Topics  . Smoking status: Current Every Day Smoker -- 1.00 packs/day  . Smokeless tobacco: None  . Alcohol Use: No    Review of Systems  Gastrointestinal: Positive for vomiting and abdominal pain.  All other systems reviewed and are negative.     Allergies   Aspartame and phenylalanine; Bee venom; Doxycycline; Hornet venom; Phenylalanine; and Trazodone and nefazodone  Home Medications   Prior to Admission medications   Medication Sig Start Date End Date Taking? Authorizing Provider  albuterol (PROVENTIL HFA;VENTOLIN HFA) 108 (90 BASE) MCG/ACT inhaler Inhale 2 puffs into the lungs every 4 (four) hours as needed for wheezing or shortness of breath. 08/09/14   Pricilla Loveless, MD  ALPRAZolam [name]) 1 MG tablet Take 1 mg by mouth 3 (three) times daily as needed for anxiety.    Historical Provider, MD  aspirin 81 MG chewable tablet Chew 81 mg by mouth daily.    Historical Provider, MD  calamine lotion Apply 1 application topically as needed for itching.    Historical Provider, MD  cephALEXin (KEFLEX) 500 MG capsule Take 1 capsule (500 mg total) by mouth 3 (three) times daily. 02/07/15   Jennifer Piepenbrink, PA-C  diphenhydrAMINE (BENADRYL) 25 MG tablet Take 25-50 mg by mouth every 8 (eight) hours as needed for itching.    Historical Provider, MD  fluticasone (FLONASE) 50 MCG/ACT nasal spray Place 2 sprays into both nostrils daily.    Historical Provider, MD  Fluticasone-Salmeterol (ADVAIR) 250-50 MCG/DOSE AEPB Inhale 1 puff into the lungs 2 (two) times daily.    Historical Provider, MD  gabapentin (NEURONTIN) 800 MG tablet Take 800 mg by mouth 3 (three) times daily.    Historical Provider, MD  lisinopril (PRINIVIL,ZESTRIL) 10 MG tablet Take 1 tablet (  10 mg total) by mouth daily. 10/20/14   [name], PA-C  metFORMIN (GLUCOPHAGE) 500 MG tablet Take 500 mg by mouth daily with breakfast.    Historical Provider, MD  metoprolol tartrate (LOPRESSOR) 25 MG tablet Take 1 tablet (25 mg total) by mouth once. Patient taking differently: Take 25 mg by mouth 2 (two) times daily.  10/20/14   [name], PA-C  naproxen (NAPROSYN) 500 MG tablet Take 1 tablet (500 mg total) by mouth 2 (two) times daily with a meal. 02/07/15   [name], PA-C  pravastatin  (PRAVACHOL) 40 MG tablet Take 1 tablet (40 mg total) by mouth daily. 10/20/14   [name], PA-C  promethazine (PHENERGAN) 25 MG tablet Take 1 tablet (25 mg total) by mouth every 6 (six) hours as needed for nausea or vomiting. 02/07/15   [name], PA-C   BP 119/84 mmHg  Pulse 88  Temp(Src) 98 F (36.7 C) (Oral)  Resp 18  SpO2 96% Physical Exam  Nursing note and vitals reviewed.  Morbidly obese 44 year old male, resting comfortably and in no acute distress. Vital signs are normal. Oxygen saturation is 96%, which is normal. Head is normocephalic and atraumatic. PERRLA, EOMI. Oropharynx is clear. Neck is nontender and supple without adenopathy or JVD. Back is nontender and there is no CVA tenderness. Lungs are clear without rales, wheezes, or rhonchi. Chest is nontender. Heart has regular rate and rhythm without murmur. Abdomen is soft, flat, with moderate periumbilical tenderness. There is no rebound or guarding. There are no masses or hepatosplenomegaly and peristalsis is hypoactive. Extremities have 1+ edema, full range of motion is present. Skin is warm and dry without rash. Neurologic: Mental status is normal, cranial nerves are intact, there are no motor or sensory deficits.  ED Course  Procedures (including critical care time) Labs Review Results for orders placed or performed during the hospital encounter of 03/03/15  CBC WITH DIFFERENTIAL  Result Value Ref Range   WBC 11.1 (H) 4.0 - 10.5 K/uL   RBC 5.12 4.22 - 5.81 MIL/uL   Hemoglobin 14.3 13.0 - 17.0 g/dL   HCT 16.1 09.6 - 04.5 %   MCV 86.9 78.0 - 100.0 fL   MCH 27.9 26.0 - 34.0 pg   MCHC 32.1 30.0 - 36.0 g/dL   RDW 40.9 81.1 - 91.4 %   Platelets 315 150 - 400 K/uL   Neutrophils Relative % 70 %   Neutro Abs 7.8 (H) 1.7 - 7.7 K/uL   Lymphocytes Relative 18 %   Lymphs Abs 2.0 0.7 - 4.0 K/uL   Monocytes Relative 10 %   Monocytes Absolute 1.1 (H) 0.1 - 1.0 K/uL   Eosinophils Relative 2 %   Eosinophils  Absolute 0.2 0.0 - 0.7 K/uL   Basophils Relative 0 %   Basophils Absolute 0.0 0.0 - 0.1 K/uL  Comprehensive metabolic panel  Result Value Ref Range   Sodium 137 135 - 145 mmol/L   Potassium 4.1 3.5 - 5.1 mmol/L   Chloride 104 101 - 111 mmol/L   CO2 24 22 - 32 mmol/L   Glucose, Bld 108 (H) 65 - 99 mg/dL   BUN 16 6 - 20 mg/dL   Creatinine, Ser 7.82 0.61 - 1.24 mg/dL   Calcium 8.5 (L) 8.9 - 10.3 mg/dL   Total Protein 7.0 6.5 - 8.1 g/dL   Albumin 3.5 3.5 - 5.0 g/dL   AST 19 15 - 41 U/L   ALT 21 17 - 63 U/L  Alkaline Phosphatase 57 38 - 126 U/L   Total Bilirubin 0.3 0.3 - 1.2 mg/dL   GFR calc non Af Amer >60 >60 mL/min   GFR calc Af Amer >60 >60 mL/min   Anion gap 9 5 - 15  Lipase, blood  Result Value Ref Range   Lipase 41 22 - 51 U/L  Urinalysis, Routine w reflex microscopic (not at [facility])  Result Value Ref Range   Color, Urine YELLOW YELLOW   APPearance CLEAR CLEAR   Specific Gravity, Urine 1.027 1.005 - 1.030   pH 5.5 5.0 - 8.0   Glucose, UA NEGATIVE NEGATIVE mg/dL   Hgb urine dipstick NEGATIVE NEGATIVE   Bilirubin Urine NEGATIVE NEGATIVE   Ketones, ur NEGATIVE NEGATIVE mg/dL   Protein, ur NEGATIVE NEGATIVE mg/dL   Urobilinogen, UA 0.2 0.0 - 1.0 mg/dL   Nitrite NEGATIVE NEGATIVE   Leukocytes, UA SMALL (A) NEGATIVE  Urine microscopic-add on  Result Value Ref Range   Squamous Epithelial / LPF RARE RARE   WBC, UA 7-10 <3 WBC/hpf   RBC / HPF 0-2 <3 RBC/hpf   Crystals CA OXALATE CRYSTALS (A) NEGATIVE  I-Stat CG4 Lactic Acid, ED  Result Value Ref Range   Lactic Acid, Venous 2.34 (HH) 0.5 - 2.0 mmol/L   Comment NOTIFIED PHYSICIAN    I have personally reviewed and evaluated these lab results as part of my medical decision-making.   MDM   Final diagnoses:  Nausea vomiting and diarrhea  Abdominal pain, unspecified abdominal location  Elevated lactic acid level    Abdominal pain, nausea, vomiting. Presentation is suspicious for fire all gastroenteritis, but length of  time that it is been going on is atypical. He'll be given IV fluids, morphine, ondansetron and oral loperamide. Will send for CT abdomen and pelvis.  Laboratory workup is significant only for mild elevation of lactic acid. He is given IV fluids and this will be repeated. CT scan is pending. Case is signed out to Dr. Freida Busman to evaluate above noted results.  Dione Booze, MD 03/03/15 [phone]

## 2015-03-03 NOTE — ED Notes (Signed)
Pt reports severe middle upper abdominal pain, was seen here earlier this morning for same and discharged but the pain has become unbearable. Pt very anxious, tearful and hyperventilating at triage. Pt reports nausea and vomiting as well.

## 2015-03-03 NOTE — ED Notes (Signed)
Called for pt, no response

## 2015-03-03 NOTE — ED Notes (Signed)
Patient ambulatory to restroom  ?

## 2015-03-06 ENCOUNTER — Emergency Department (HOSPITAL_COMMUNITY)
Admission: EM | Admit: 2015-03-06 | Discharge: 2015-03-06 | Disposition: A | Discharge status: Home / Self Care | Payer: Self-pay | Attending: Emergency Medicine | Admitting: Emergency Medicine

## 2015-03-06 ENCOUNTER — Emergency Department (HOSPITAL_COMMUNITY): Payer: Medicaid - Out of State

## 2015-03-06 ENCOUNTER — Encounter (HOSPITAL_COMMUNITY): Payer: Self-pay | Admitting: Emergency Medicine

## 2015-03-06 DIAGNOSIS — R112 Nausea with vomiting, unspecified: Secondary | ICD-10-CM | POA: Insufficient documentation

## 2015-03-06 DIAGNOSIS — I2510 Atherosclerotic heart disease of native coronary artery without angina pectoris: Secondary | ICD-10-CM | POA: Insufficient documentation

## 2015-03-06 DIAGNOSIS — Z79899 Other long term (current) drug therapy: Secondary | ICD-10-CM | POA: Insufficient documentation

## 2015-03-06 DIAGNOSIS — I10 Essential (primary) hypertension: Secondary | ICD-10-CM | POA: Insufficient documentation

## 2015-03-06 DIAGNOSIS — E669 Obesity, unspecified: Secondary | ICD-10-CM | POA: Insufficient documentation

## 2015-03-06 DIAGNOSIS — Z87448 Personal history of other diseases of urinary system: Secondary | ICD-10-CM | POA: Insufficient documentation

## 2015-03-06 DIAGNOSIS — M1990 Unspecified osteoarthritis, unspecified site: Secondary | ICD-10-CM | POA: Insufficient documentation

## 2015-03-06 DIAGNOSIS — R1011 Right upper quadrant pain: Secondary | ICD-10-CM | POA: Insufficient documentation

## 2015-03-06 DIAGNOSIS — G43509 Persistent migraine aura without cerebral infarction, not intractable, without status migrainosus: Secondary | ICD-10-CM | POA: Insufficient documentation

## 2015-03-06 DIAGNOSIS — Z8719 Personal history of other diseases of the digestive system: Secondary | ICD-10-CM | POA: Insufficient documentation

## 2015-03-06 DIAGNOSIS — Z720 Tobacco use: Secondary | ICD-10-CM | POA: Insufficient documentation

## 2015-03-06 DIAGNOSIS — R1010 Upper abdominal pain, unspecified: Secondary | ICD-10-CM

## 2015-03-06 DIAGNOSIS — R197 Diarrhea, unspecified: Secondary | ICD-10-CM | POA: Insufficient documentation

## 2015-03-06 DIAGNOSIS — R1013 Epigastric pain: Secondary | ICD-10-CM | POA: Insufficient documentation

## 2015-03-06 DIAGNOSIS — Z7982 Long term (current) use of aspirin: Secondary | ICD-10-CM | POA: Insufficient documentation

## 2015-03-06 DIAGNOSIS — F329 Major depressive disorder, single episode, unspecified: Secondary | ICD-10-CM | POA: Insufficient documentation

## 2015-03-06 DIAGNOSIS — Z791 Long term (current) use of non-steroidal anti-inflammatories (NSAID): Secondary | ICD-10-CM | POA: Insufficient documentation

## 2015-03-06 DIAGNOSIS — Z7951 Long term (current) use of inhaled steroids: Secondary | ICD-10-CM | POA: Insufficient documentation

## 2015-03-06 DIAGNOSIS — J45909 Unspecified asthma, uncomplicated: Secondary | ICD-10-CM | POA: Insufficient documentation

## 2015-03-06 DIAGNOSIS — Z9889 Other specified postprocedural states: Secondary | ICD-10-CM | POA: Insufficient documentation

## 2015-03-06 LAB — COMPREHENSIVE METABOLIC PANEL
ALT: 26 U/L (ref 17–63)
AST: 21 U/L (ref 15–41)
Albumin: 3.4 g/dL — ABNORMAL LOW (ref 3.5–5.0)
Alkaline Phosphatase: 54 U/L (ref 38–126)
Anion gap: 9 (ref 5–15)
BUN: 14 mg/dL (ref 6–20)
CHLORIDE: 105 mmol/L (ref 101–111)
CO2: 26 mmol/L (ref 22–32)
Calcium: 9.1 mg/dL (ref 8.9–10.3)
Creatinine, Ser: 1.29 mg/dL — ABNORMAL HIGH (ref 0.61–1.24)
Glucose, Bld: 138 mg/dL — ABNORMAL HIGH (ref 65–99)
POTASSIUM: 3.8 mmol/L (ref 3.5–5.1)
Sodium: 140 mmol/L (ref 135–145)
Total Bilirubin: 0.2 mg/dL — ABNORMAL LOW (ref 0.3–1.2)
Total Protein: 6.7 g/dL (ref 6.5–8.1)

## 2015-03-06 LAB — URINALYSIS, ROUTINE W REFLEX MICROSCOPIC
Bilirubin Urine: NEGATIVE
Glucose, UA: NEGATIVE mg/dL
Hgb urine dipstick: NEGATIVE
KETONES UR: NEGATIVE mg/dL
NITRITE: NEGATIVE
PH: 6 (ref 5.0–8.0)
Protein, ur: NEGATIVE mg/dL
SPECIFIC GRAVITY, URINE: 1.023 (ref 1.005–1.030)
Urobilinogen, UA: 1 mg/dL (ref 0.0–1.0)

## 2015-03-06 LAB — URINE MICROSCOPIC-ADD ON

## 2015-03-06 LAB — CBC
HEMATOCRIT: 44.8 % (ref 39.0–52.0)
Hemoglobin: 14.7 g/dL (ref 13.0–17.0)
MCH: 28.4 pg (ref 26.0–34.0)
MCHC: 32.8 g/dL (ref 30.0–36.0)
MCV: 86.5 fL (ref 78.0–100.0)
Platelets: 320 10*3/uL (ref 150–400)
RBC: 5.18 MIL/uL (ref 4.22–5.81)
RDW: 14.4 % (ref 11.5–15.5)
WBC: 12 10*3/uL — AB (ref 4.0–10.5)

## 2015-03-06 LAB — LIPASE, BLOOD: LIPASE: 24 U/L (ref 22–51)

## 2015-03-06 MED ORDER — HYDROMORPHONE HCL 1 MG/ML IJ SOLN
1.0000 mg | Freq: Once | INTRAMUSCULAR | Status: AC
  Administered 2015-03-06: 1 mg via INTRAVENOUS
  Filled 2015-03-06: qty 1

## 2015-03-06 MED ORDER — ONDANSETRON 8 MG PO TBDP: 8.0000 mg | ORAL_TABLET | Freq: Three times a day (TID) | ORAL | Status: DC | PRN

## 2015-03-06 MED ORDER — SODIUM CHLORIDE 0.9 % IV SOLN
1000.0000 mL | INTRAVENOUS | Status: DC
  Administered 2015-03-06: 1000 mL via INTRAVENOUS

## 2015-03-06 MED ORDER — ONDANSETRON HCL 4 MG/2ML IJ SOLN
4.0000 mg | Freq: Once | INTRAMUSCULAR | Status: AC
  Administered 2015-03-06: 4 mg via INTRAVENOUS
  Filled 2015-03-06: qty 2

## 2015-03-06 MED ORDER — SODIUM CHLORIDE 0.9 % IV SOLN
1000.0000 mL | Freq: Once | INTRAVENOUS | Status: AC
  Administered 2015-03-06: 1000 mL via INTRAVENOUS

## 2015-03-06 MED ORDER — METOCLOPRAMIDE HCL 5 MG/ML IJ SOLN
10.0000 mg | Freq: Once | INTRAMUSCULAR | Status: AC
  Administered 2015-03-06: 10 mg via INTRAVENOUS
  Filled 2015-03-06: qty 2

## 2015-03-06 NOTE — ED Notes (Signed)
Pt. reports mid abdominal pain and low back pain with emesis and diarrhea onset last week worse today , denies fever or chills.

## 2015-03-06 NOTE — ED Provider Notes (Signed)
CSN: [id number]     Arrival date & time 03/06/15  0224 History  By signing my name below, I, Richard Anderson, attest that this documentation has been prepared under the direction and in the presence of [name], MD. Electronically Signed: [name], ED Scribe. 03/06/2015. 3:53 AM.   Chief Complaint  Patient presents with  . Abdominal Pain   The history is provided by the patient. No language interpreter was used.   HPI Comments: Richard Anderson is a 44 y.o. Male with a hx of HTN, renal disorder, CAD, GERD and UTI who presents to the Emergency Department complaining of gradually worsening, nausea, vomiting, diarrhea, and upper abdominal pain that radiates to back onset 5 days ago. Pt states that he was seen 3 days ago, but the pain has gotten worse. Pt states that the Zofran given to him last visit would help temporarily, but will get sick again when trying to eat. Pt states that he has frequently had UTIs ever since childhood and has been seen multiple times in the ED for them. Denies hematochezia, hematemesis,fever or chills. Reports hx of appendectomy and tonsillectomy.   Past Medical History  Diagnosis Date  . Hypertension   . Obesity   . Arthritis   . Renal disorder   . Depression   . Migraine aura, persistent     since childhood  . Head ache   . Coronary artery disease   . GERD (gastroesophageal reflux disease)   . Asthma    Past Surgical History  Procedure Laterality Date  . Cardiac catheterization      x2  . Appendectomy     Family History  Problem Relation Age of Onset  . Hypertension Mother   . Hyperlipidemia Mother   . Hypertension Brother    Social History  Substance Use Topics  . Smoking status: Current Every Day Smoker -- 0.00 packs/day  . Smokeless tobacco: None  . Alcohol Use: No    Review of Systems  Constitutional: Negative for fever and chills.  Gastrointestinal: Positive for nausea, vomiting, abdominal pain and diarrhea. Negative for blood in  stool.  All other systems reviewed and are negative.  Allergies  Aspartame and phenylalanine; Bee venom; Doxycycline; Hornet venom; Phenylalanine; and Trazodone and nefazodone  Home Medications   Prior to Admission medications   Medication Sig Start Date End Date Taking? Authorizing Provider  albuterol (PROVENTIL HFA;VENTOLIN HFA) 108 (90 BASE) MCG/ACT inhaler Inhale 2 puffs into the lungs every 4 (four) hours as needed for wheezing or shortness of breath. 08/09/14   Pricilla Loveless, MD  ALPRAZolam [name]) 1 MG tablet Take 1 mg by mouth 3 (three) times daily as needed for anxiety.    Historical Provider, MD  aspirin 81 MG chewable tablet Chew 81 mg by mouth daily.    Historical Provider, MD  calamine lotion Apply 1 application topically as needed for itching.    Historical Provider, MD  diphenhydrAMINE (BENADRYL) 25 MG tablet Take 25-50 mg by mouth every 8 (eight) hours as needed for itching.    Historical Provider, MD  diphenoxylate-atropine (LOMOTIL) 2.5-0.025 MG per tablet Take 2 tablets by mouth 4 (four) times daily as needed for diarrhea or loose stools. 03/03/15   [name], MD  fluticasone (FLONASE) 50 MCG/ACT nasal spray Place 2 sprays into both nostrils daily.    Historical Provider, MD  Fluticasone-Salmeterol (ADVAIR) 250-50 MCG/DOSE AEPB Inhale 1 puff into the lungs 2 (two) times daily.    Historical Provider, MD  gabapentin (NEURONTIN)  800 MG tablet Take 800 mg by mouth 3 (three) times daily.    Historical Provider, MD  lisinopril (PRINIVIL,ZESTRIL) 10 MG tablet Take 1 tablet (10 mg total) by mouth daily. 10/20/14   [name], PA-C  metFORMIN (GLUCOPHAGE) 500 MG tablet Take 500 mg by mouth daily with breakfast.    Historical Provider, MD  metoprolol tartrate (LOPRESSOR) 25 MG tablet Take 1 tablet (25 mg total) by mouth once. Patient taking differently: Take 25 mg by mouth 2 (two) times daily.  10/20/14   [name], PA-C  naproxen (NAPROSYN) 500 MG tablet Take 1 tablet (500 mg  total) by mouth 2 (two) times daily with a meal. 02/07/15   [name], PA-C  pravastatin (PRAVACHOL) 40 MG tablet Take 1 tablet (40 mg total) by mouth daily. 10/20/14   [name], PA-C  promethazine (PHENERGAN) 25 MG tablet Take 1 tablet (25 mg total) by mouth every 6 (six) hours as needed for nausea or vomiting. 03/03/15   [name], MD   BP 174/77 mmHg  Pulse 95  Temp(Src) 98.1 F (36.7 C) (Oral)  SpO2 95%  Physical Exam  Constitutional: He is oriented to person, place, and time. He appears well-developed and well-nourished.  HENT:  Head: Normocephalic and atraumatic.  Eyes: EOM are normal.  Neck: Normal range of motion.  Cardiovascular: Normal rate, regular rhythm, normal heart sounds and intact distal pulses.   Pulmonary/Chest: Effort normal and breath sounds normal. No respiratory distress.  Abdominal: Soft. He exhibits no distension. There is tenderness in the right upper quadrant and epigastric area.  Obese abdomen  Musculoskeletal: Normal range of motion.  Neurological: He is alert and oriented to person, place, and time.  Skin: Skin is warm and dry.  Psychiatric: He has a normal mood and affect. Judgment normal.  Nursing note and vitals reviewed.   ED Course  Procedures (including critical care time) DIAGNOSTIC STUDIES: Oxygen Saturation is 95% on RA, adequate by my interpretation.    COORDINATION OF CARE: 3:50 AM-Discussed treatment plan which includes pain medication, nausea medication, IV fluids and [place] with pt at bedside and pt agreed to plan.   Labs Review Labs Reviewed  COMPREHENSIVE METABOLIC PANEL - Abnormal; Notable for the following:    Glucose, Bld 138 (*)    Creatinine, Ser 1.29 (*)    Albumin 3.4 (*)    Total Bilirubin 0.2 (*)    All other components within normal limits  CBC - Abnormal; Notable for the following:    WBC 12.0 (*)    All other components within normal limits  URINALYSIS, ROUTINE W REFLEX MICROSCOPIC (NOT AT [facility]) -  Abnormal; Notable for the following:    APPearance CLOUDY (*)    Leukocytes, UA SMALL (*)    All other components within normal limits  URINE MICROSCOPIC-ADD ON - Abnormal; Notable for the following:    Crystals CA OXALATE CRYSTALS (*)    All other components within normal limits  LIPASE, BLOOD    Imaging Review US Abdomen Limited  03/06/2015   CLINICAL DATA:  Right upper quadrant pain.  Evaluate gallbladder.  EXAM: US ABDOMEN LIMITED - RIGHT UPPER QUADRANT  COMPARISON:  Abdominal CT from 3 days ago  FINDINGS: Gallbladder:  No gallstones or wall thickening visualized. No sonographic Murphy sign noted.  Common bile duct:  Poorly visualized due to patient size and hepatic steatosis. Normal diameter where seen at 6 mm.  Liver:  Echogenic with poor acoustic transmission, severely limiting sonography for evaluating the liver.  No mass or other focal change seen on recent CT. Antegrade flow in the imaged portal venous system.  IMPRESSION: 1. No acute finding.  Negative gallbladder. 2. Hepatic steatosis. 3. Limited study due to patient size and #2.   Electronically Signed   By: Marnee Spring M.D.   On: 03/06/2015 06:23  I personally reviewed the imaging tests through PACS system I reviewed available ER/hospitalization records through the EMR     EKG Interpretation None      MDM   Final diagnoses:  None   6:53 AM Patient's pain is improving but cells mild nausea at this time.  Afebrile.  Repeat abdominal exam is without tenderness at this time.  No gallstones or wall thickening noted.  At this time.  The patient be discharged home.  He'll follow-up with a primary care physician.  He understands return the ER for new or worsening symptoms.  If he were to return with ongoing or worsening abdominal pain he would require CT scan.  At this time I do not think he needs it.   I personally performed the services described in this documentation, which was scribed in my presence. The recorded  information has been reviewed and is accurate.         [name], MD 03/06/15 [phone]

## 2015-03-06 NOTE — ED Notes (Signed)
MD at bedside. 

## 2015-03-06 NOTE — ED Notes (Signed)
Pt taken to US

## 2015-03-06 NOTE — Discharge Instructions (Signed)

## 2015-03-06 NOTE — ED Notes (Signed)
Pt requesting nausea and pain medications; MD aware

## 2015-03-06 NOTE — ED Notes (Signed)
Pt given ginger ale to drink. 

## 2015-04-05 ENCOUNTER — Encounter (HOSPITAL_COMMUNITY): Payer: Self-pay | Admitting: Emergency Medicine

## 2015-04-05 ENCOUNTER — Emergency Department (HOSPITAL_COMMUNITY)
Admission: EM | Admit: 2015-04-05 | Discharge: 2015-04-06 | Discharge status: Against Medical Advice | Payer: Medicaid - Out of State | Attending: Emergency Medicine | Admitting: Emergency Medicine

## 2015-04-05 DIAGNOSIS — R519 Headache, unspecified: Secondary | ICD-10-CM

## 2015-04-05 DIAGNOSIS — F329 Major depressive disorder, single episode, unspecified: Secondary | ICD-10-CM | POA: Insufficient documentation

## 2015-04-05 DIAGNOSIS — Z8719 Personal history of other diseases of the digestive system: Secondary | ICD-10-CM | POA: Insufficient documentation

## 2015-04-05 DIAGNOSIS — J45909 Unspecified asthma, uncomplicated: Secondary | ICD-10-CM | POA: Insufficient documentation

## 2015-04-05 DIAGNOSIS — M1990 Unspecified osteoarthritis, unspecified site: Secondary | ICD-10-CM | POA: Diagnosis not present

## 2015-04-05 DIAGNOSIS — I2510 Atherosclerotic heart disease of native coronary artery without angina pectoris: Secondary | ICD-10-CM | POA: Insufficient documentation

## 2015-04-05 DIAGNOSIS — Z7982 Long term (current) use of aspirin: Secondary | ICD-10-CM | POA: Insufficient documentation

## 2015-04-05 DIAGNOSIS — R51 Headache: Secondary | ICD-10-CM

## 2015-04-05 DIAGNOSIS — M542 Cervicalgia: Secondary | ICD-10-CM | POA: Insufficient documentation

## 2015-04-05 DIAGNOSIS — Z720 Tobacco use: Secondary | ICD-10-CM | POA: Diagnosis not present

## 2015-04-05 DIAGNOSIS — Z87448 Personal history of other diseases of urinary system: Secondary | ICD-10-CM | POA: Insufficient documentation

## 2015-04-05 DIAGNOSIS — Z79899 Other long term (current) drug therapy: Secondary | ICD-10-CM | POA: Insufficient documentation

## 2015-04-05 DIAGNOSIS — I10 Essential (primary) hypertension: Secondary | ICD-10-CM | POA: Insufficient documentation

## 2015-04-05 DIAGNOSIS — G43909 Migraine, unspecified, not intractable, without status migrainosus: Secondary | ICD-10-CM | POA: Insufficient documentation

## 2015-04-05 DIAGNOSIS — Z7951 Long term (current) use of inhaled steroids: Secondary | ICD-10-CM | POA: Diagnosis not present

## 2015-04-05 DIAGNOSIS — R509 Fever, unspecified: Secondary | ICD-10-CM | POA: Insufficient documentation

## 2015-04-05 DIAGNOSIS — E669 Obesity, unspecified: Secondary | ICD-10-CM | POA: Insufficient documentation

## 2015-04-05 LAB — CBC WITH DIFFERENTIAL/PLATELET
Basophils Absolute: 0 10*3/uL (ref 0.0–0.1)
Basophils Relative: 0 %
EOS ABS: 0.2 10*3/uL (ref 0.0–0.7)
Eosinophils Relative: 2 %
HEMATOCRIT: 42.9 % (ref 39.0–52.0)
HEMOGLOBIN: 14.2 g/dL (ref 13.0–17.0)
LYMPHS ABS: 2.1 10*3/uL (ref 0.7–4.0)
Lymphocytes Relative: 19 %
MCH: 28.5 pg (ref 26.0–34.0)
MCHC: 33.1 g/dL (ref 30.0–36.0)
MCV: 86 fL (ref 78.0–100.0)
MONO ABS: 1.1 10*3/uL — AB (ref 0.1–1.0)
MONOS PCT: 10 %
NEUTROS ABS: 7.5 10*3/uL (ref 1.7–7.7)
NEUTROS PCT: 69 %
Platelets: 313 10*3/uL (ref 150–400)
RBC: 4.99 MIL/uL (ref 4.22–5.81)
RDW: 14.6 % (ref 11.5–15.5)
WBC: 10.8 10*3/uL — ABNORMAL HIGH (ref 4.0–10.5)

## 2015-04-05 LAB — BASIC METABOLIC PANEL
Anion gap: 10 (ref 5–15)
BUN: 18 mg/dL (ref 6–20)
CHLORIDE: 104 mmol/L (ref 101–111)
CO2: 23 mmol/L (ref 22–32)
CREATININE: 1.07 mg/dL (ref 0.61–1.24)
Calcium: 8.7 mg/dL — ABNORMAL LOW (ref 8.9–10.3)
GFR calc Af Amer: >60 mL/min (ref >60)
GFR calc non Af Amer: >60 mL/min (ref >60)
Glucose, Bld: 119 mg/dL — ABNORMAL HIGH (ref 65–99)
Potassium: 3.6 mmol/L (ref 3.5–5.1)
Sodium: 137 mmol/L (ref 135–145)

## 2015-04-05 MED ORDER — METOCLOPRAMIDE HCL 5 MG/ML IJ SOLN
10.0000 mg | Freq: Once | INTRAMUSCULAR | Status: AC
  Administered 2015-04-05: 10 mg via INTRAVENOUS
  Filled 2015-04-05: qty 2

## 2015-04-05 MED ORDER — SODIUM CHLORIDE 0.9 % IV BOLUS (SEPSIS)
1000.0000 mL | Freq: Once | INTRAVENOUS | Status: AC
  Administered 2015-04-05: 1000 mL via INTRAVENOUS

## 2015-04-05 MED ORDER — DIPHENHYDRAMINE HCL 50 MG/ML IJ SOLN
25.0000 mg | Freq: Once | INTRAMUSCULAR | Status: AC
  Administered 2015-04-05: 25 mg via INTRAVENOUS
  Filled 2015-04-05: qty 1

## 2015-04-05 MED ORDER — KETOROLAC TROMETHAMINE 30 MG/ML IJ SOLN
30.0000 mg | Freq: Once | INTRAMUSCULAR | Status: AC
  Administered 2015-04-05: 30 mg via INTRAVENOUS
  Filled 2015-04-05: qty 1

## 2015-04-05 MED ORDER — DEXAMETHASONE SODIUM PHOSPHATE 10 MG/ML IJ SOLN
10.0000 mg | Freq: Once | INTRAMUSCULAR | Status: AC
  Administered 2015-04-05: 10 mg via INTRAVENOUS
  Filled 2015-04-05: qty 1

## 2015-04-05 NOTE — ED Notes (Signed)
Pt. reports headache with low grade fever onset this week , denies cough  or congestion , no nausea or diarrhea.

## 2015-04-05 NOTE — ED Provider Notes (Signed)
History  By signing my name below, I, Richard Anderson, attest that this documentation has been prepared under the direction and in the presence of Geoffery Lyons, MD. Electronically Signed: Karle Anderson, ED Scribe. 04/05/2015. 11:42 PM.  Chief Complaint  Patient presents with  . Headache  . Fever   Patient is a 44 y.o. male presenting with headaches and fever. The history is provided by the patient and medical records. No language interpreter was used.  Headache Pain location:  Occipital Quality: pressure. Radiates to: posterior neck to bilateral shoulders. Severity currently:  10/10 Severity at highest:  10/10 Duration:  6 days Timing:  Constant Progression:  Unchanged Chronicity:  New Similar to prior headaches: no   Relieved by:  Nothing Ineffective treatments:  Acetaminophen Associated symptoms: fever, nausea, neck pain and vomiting   Fever Associated symptoms: headaches, nausea and vomiting     HPI Comments:  Richard Anderson is a 44 y.o. morbidly obese male with PMHx of migraines who presents to the Emergency Department complaining of severe, constant HA that began 6 days ago. He describes the pain as pressure and states it radiates down the posterior neck into bilateral shoulders. He reports associated nausea, vomiting and low grade fever. He reports taking "an astronomical amount of Tylenol" with no significant relief of the pain. He states this does not feel like the normal migraines he has had in the past.   Past Medical History  Diagnosis Date  . Hypertension   . Obesity   . Arthritis   . Renal disorder   . Depression   . Migraine aura, persistent     since childhood  . Head ache   . Coronary artery disease   . GERD (gastroesophageal reflux disease)   . Asthma    Past Surgical History  Procedure Laterality Date  . Cardiac catheterization      x2  . Appendectomy     Family History  Problem Relation Age of Onset  . Hypertension Mother   .  Hyperlipidemia Mother   . Hypertension Brother    Social History  Substance Use Topics  . Smoking status: Current Every Day Smoker -- 0.00 packs/day  . Smokeless tobacco: None  . Alcohol Use: No    Review of Systems  Constitutional: Positive for fever.  Gastrointestinal: Positive for nausea and vomiting.  Musculoskeletal: Positive for neck pain.  Neurological: Positive for headaches.  All other systems reviewed and are negative.   Allergies  Bee venom; Hornet venom; Aspartame and phenylalanine; Doxycycline; Phenylalanine; and Trazodone and nefazodone  Home Medications   Prior to Admission medications   Medication Sig Start Date End Date Taking? Authorizing Provider  acetaminophen (TYLENOL) 500 MG tablet Take 500 mg by mouth every 6 (six) hours as needed for mild pain.   Yes Historical Provider, MD  albuterol (PROVENTIL HFA;VENTOLIN HFA) 108 (90 BASE) MCG/ACT inhaler Inhale 2 puffs into the lungs every 4 (four) hours as needed for wheezing or shortness of breath. 08/09/14  Yes Pricilla Loveless, MD  ALPRAZolam [name]) 1 MG tablet Take 1 mg by mouth 3 (three) times daily as needed for anxiety.   Yes Historical Provider, MD  aspirin 81 MG chewable tablet Chew 81 mg by mouth daily.   Yes Historical Provider, MD  calamine lotion Apply 1 application topically as needed for itching.   Yes Historical Provider, MD  diphenhydrAMINE (BENADRYL) 25 MG tablet Take 25-50 mg by mouth every 8 (eight) hours as needed for itching.   Yes Historical Provider,  MD  fluticasone (FLONASE) 50 MCG/ACT nasal spray Place 2 sprays into both nostrils daily as needed for allergies.    Yes Historical Provider, MD  Fluticasone-Salmeterol (ADVAIR) 250-50 MCG/DOSE AEPB Inhale 1 puff into the lungs 2 (two) times daily.   Yes Historical Provider, MD  gabapentin (NEURONTIN) 800 MG tablet Take 800 mg by mouth 3 (three) times daily.   Yes Historical Provider, MD  ibuprofen (ADVIL,MOTRIN) 200 MG tablet Take 800 mg by mouth every  6 (six) hours as needed for moderate pain.   Yes Historical Provider, MD  lisinopril (PRINIVIL,ZESTRIL) 10 MG tablet Take 1 tablet (10 mg total) by mouth daily. 10/20/14  Yes [name], PA-C  metFORMIN (GLUCOPHAGE) 500 MG tablet Take 500 mg by mouth daily with breakfast.   Yes Historical Provider, MD  metoprolol tartrate (LOPRESSOR) 25 MG tablet Take 1 tablet (25 mg total) by mouth once. Patient taking differently: Take 25 mg by mouth 2 (two) times daily.  10/20/14  Yes [name], PA-C  pravastatin (PRAVACHOL) 40 MG tablet Take 1 tablet (40 mg total) by mouth daily. 10/20/14  Yes [name], PA-C  promethazine (PHENERGAN) 25 MG tablet Take 1 tablet (25 mg total) by mouth every 6 (six) hours as needed for nausea or vomiting. 03/03/15  Yes [name], MD   Triage Vitals: BP 158/60 mmHg  Pulse 95  Temp(Src) 99.1 F (37.3 C) (Oral)  Resp 20  SpO2 94% Physical Exam  Constitutional: He is oriented to person, place, and time. He appears well-developed and well-nourished.  HENT:  Head: Normocephalic.  Eyes: EOM are normal. Pupils are equal, round, and reactive to light.  No papilledema on funduscopic exam  Neck: Normal range of motion. Neck supple.  Cardiovascular: Normal rate, regular rhythm and normal heart sounds.   Pulmonary/Chest: Effort normal and breath sounds normal. No respiratory distress.  Abdominal: He exhibits no distension.  Musculoskeletal: Normal range of motion.  Neurological: He is alert and oriented to person, place, and time. No cranial nerve deficit. He exhibits normal muscle tone. Coordination normal.  Psychiatric: He has a normal mood and affect.  Nursing note and vitals reviewed.   ED Course  Procedures (including critical care time) DIAGNOSTIC STUDIES: Oxygen Saturation is 94% on RA, adequate by my interpretation.   COORDINATION OF CARE: 11:41 PM- Will order migraine cocktail and IV fluids. Pt verbalizes understanding and agrees to plan.  Medications   sodium chloride 0.9 % bolus 1,000 mL (not administered)  ketorolac (TORADOL) 30 MG/ML injection 30 mg (not administered)  metoCLOPramide (REGLAN) injection 10 mg (not administered)  diphenhydrAMINE (BENADRYL) injection 25 mg (not administered)  dexamethasone (DECADRON) injection 10 mg (not administered)    Labs Review Labs Reviewed  CBC WITH DIFFERENTIAL/PLATELET - Abnormal; Notable for the following:    WBC 10.8 (*)    Monocytes Absolute 1.1 (*)    All other components within normal limits  BASIC METABOLIC PANEL - Abnormal; Notable for the following:    Glucose, Bld 119 (*)    Calcium 8.7 (*)    All other components within normal limits    Imaging Review No results found. I have personally reviewed and evaluated these images and lab results as part of my medical decision-making.   EKG Interpretation None      MDM   Final diagnoses:  None    Patient presents here with complaints of headache. He has a history of migraines, however reports this feels different. His neurologic exam is nonfocal and he appears in  no significant distress. My intensions were to administer a migraine cocktail and obtain a CT of his head to rule out acute pathology. These were ordered, however not completed as the patient decided to sign out AGAINST MEDICAL ADVICE. He told the nurse that he is just found out someone was being taken to the hospital and he needed to be there immediately. He signed the forms and left the emergency department.  I personally performed the services described in this documentation, which was scribed in my presence. The recorded information has been reviewed and is accurate.      Geoffery Lyonsouglas Luccia Reinheimer, MD 04/06/15 0040

## 2015-04-06 NOTE — ED Notes (Signed)
Pt requested to leave AMA due to "a family emergency."  Provider informed.  Pt signed AMA paperwork and acknowledged S/S of when to come back to the ED

## 2015-05-16 ENCOUNTER — Emergency Department (HOSPITAL_COMMUNITY): Payer: Medicaid - Out of State

## 2015-05-16 ENCOUNTER — Emergency Department (HOSPITAL_COMMUNITY)
Admission: EM | Admit: 2015-05-16 | Discharge: 2015-05-16 | Disposition: A | Discharge status: Home / Self Care | Payer: Self-pay | Attending: Emergency Medicine | Admitting: Emergency Medicine

## 2015-05-16 ENCOUNTER — Encounter (HOSPITAL_COMMUNITY): Payer: Self-pay | Admitting: Emergency Medicine

## 2015-05-16 DIAGNOSIS — J45901 Unspecified asthma with (acute) exacerbation: Secondary | ICD-10-CM | POA: Insufficient documentation

## 2015-05-16 DIAGNOSIS — N200 Calculus of kidney: Secondary | ICD-10-CM | POA: Insufficient documentation

## 2015-05-16 DIAGNOSIS — I10 Essential (primary) hypertension: Secondary | ICD-10-CM | POA: Insufficient documentation

## 2015-05-16 DIAGNOSIS — Z7951 Long term (current) use of inhaled steroids: Secondary | ICD-10-CM | POA: Insufficient documentation

## 2015-05-16 DIAGNOSIS — I2510 Atherosclerotic heart disease of native coronary artery without angina pectoris: Secondary | ICD-10-CM | POA: Insufficient documentation

## 2015-05-16 DIAGNOSIS — Z79899 Other long term (current) drug therapy: Secondary | ICD-10-CM | POA: Insufficient documentation

## 2015-05-16 DIAGNOSIS — Z9889 Other specified postprocedural states: Secondary | ICD-10-CM | POA: Insufficient documentation

## 2015-05-16 DIAGNOSIS — G43509 Persistent migraine aura without cerebral infarction, not intractable, without status migrainosus: Secondary | ICD-10-CM | POA: Insufficient documentation

## 2015-05-16 DIAGNOSIS — Z7984 Long term (current) use of oral hypoglycemic drugs: Secondary | ICD-10-CM | POA: Insufficient documentation

## 2015-05-16 DIAGNOSIS — Z87448 Personal history of other diseases of urinary system: Secondary | ICD-10-CM | POA: Insufficient documentation

## 2015-05-16 DIAGNOSIS — E669 Obesity, unspecified: Secondary | ICD-10-CM | POA: Insufficient documentation

## 2015-05-16 DIAGNOSIS — R109 Unspecified abdominal pain: Secondary | ICD-10-CM | POA: Insufficient documentation

## 2015-05-16 DIAGNOSIS — F17200 Nicotine dependence, unspecified, uncomplicated: Secondary | ICD-10-CM | POA: Insufficient documentation

## 2015-05-16 DIAGNOSIS — F329 Major depressive disorder, single episode, unspecified: Secondary | ICD-10-CM | POA: Insufficient documentation

## 2015-05-16 DIAGNOSIS — M1990 Unspecified osteoarthritis, unspecified site: Secondary | ICD-10-CM | POA: Insufficient documentation

## 2015-05-16 DIAGNOSIS — Z7982 Long term (current) use of aspirin: Secondary | ICD-10-CM | POA: Insufficient documentation

## 2015-05-16 DIAGNOSIS — Z8719 Personal history of other diseases of the digestive system: Secondary | ICD-10-CM | POA: Insufficient documentation

## 2015-05-16 LAB — URINALYSIS, ROUTINE W REFLEX MICROSCOPIC
Bilirubin Urine: NEGATIVE
GLUCOSE, UA: NEGATIVE mg/dL
Ketones, ur: NEGATIVE mg/dL
Nitrite: NEGATIVE
PH: 6 (ref 5.0–8.0)
Protein, ur: NEGATIVE mg/dL
Specific Gravity, Urine: 1.023 (ref 1.005–1.030)

## 2015-05-16 LAB — COMPREHENSIVE METABOLIC PANEL
ALT: 22 U/L (ref 17–63)
ANION GAP: 7 (ref 5–15)
AST: 19 U/L (ref 15–41)
Albumin: 3.3 g/dL — ABNORMAL LOW (ref 3.5–5.0)
Alkaline Phosphatase: 52 U/L (ref 38–126)
BILIRUBIN TOTAL: 0.1 mg/dL — AB (ref 0.3–1.2)
BUN: 14 mg/dL (ref 6–20)
CO2: 28 mmol/L (ref 22–32)
Calcium: 9 mg/dL (ref 8.9–10.3)
Chloride: 105 mmol/L (ref 101–111)
Creatinine, Ser: 1.06 mg/dL (ref 0.61–1.24)
Glucose, Bld: 156 mg/dL — ABNORMAL HIGH (ref 65–99)
POTASSIUM: 3.7 mmol/L (ref 3.5–5.1)
Sodium: 140 mmol/L (ref 135–145)
TOTAL PROTEIN: 6.1 g/dL — AB (ref 6.5–8.1)

## 2015-05-16 LAB — CBC WITH DIFFERENTIAL/PLATELET
Basophils Absolute: 0 10*3/uL (ref 0.0–0.1)
Basophils Relative: 0 %
EOS PCT: 3 %
Eosinophils Absolute: 0.3 10*3/uL (ref 0.0–0.7)
HEMATOCRIT: 41.4 % (ref 39.0–52.0)
Hemoglobin: 13.4 g/dL (ref 13.0–17.0)
LYMPHS PCT: 23 %
Lymphs Abs: 2.2 10*3/uL (ref 0.7–4.0)
MCH: 28.3 pg (ref 26.0–34.0)
MCHC: 32.4 g/dL (ref 30.0–36.0)
MCV: 87.5 fL (ref 78.0–100.0)
MONO ABS: 0.7 10*3/uL (ref 0.1–1.0)
MONOS PCT: 8 %
NEUTROS ABS: 6.2 10*3/uL (ref 1.7–7.7)
Neutrophils Relative %: 66 %
PLATELETS: 266 10*3/uL (ref 150–400)
RBC: 4.73 MIL/uL (ref 4.22–5.81)
RDW: 14.7 % (ref 11.5–15.5)
WBC: 9.4 10*3/uL (ref 4.0–10.5)

## 2015-05-16 LAB — URINE MICROSCOPIC-ADD ON

## 2015-05-16 MED ORDER — IPRATROPIUM-ALBUTEROL 0.5-2.5 (3) MG/3ML IN SOLN
3.0000 mL | Freq: Once | RESPIRATORY_TRACT | Status: AC
  Administered 2015-05-16: 3 mL via RESPIRATORY_TRACT
  Filled 2015-05-16: qty 3

## 2015-05-16 MED ORDER — OXYCODONE-ACETAMINOPHEN 5-325 MG PO TABS: 1.0000 | ORAL_TABLET | Freq: Four times a day (QID) | ORAL | Status: DC | PRN

## 2015-05-16 MED ORDER — ALBUTEROL SULFATE HFA 108 (90 BASE) MCG/ACT IN AERS: 2.0000 | INHALATION_SPRAY | RESPIRATORY_TRACT | Status: AC | PRN

## 2015-05-16 MED ORDER — ALBUTEROL SULFATE (2.5 MG/3ML) 0.083% IN NEBU
INHALATION_SOLUTION | RESPIRATORY_TRACT | Status: AC
  Filled 2015-05-16: qty 6

## 2015-05-16 MED ORDER — MORPHINE SULFATE (PF) 4 MG/ML IV SOLN
4.0000 mg | Freq: Once | INTRAVENOUS | Status: AC
  Administered 2015-05-16: 4 mg via INTRAVENOUS
  Filled 2015-05-16: qty 1

## 2015-05-16 MED ORDER — KETOROLAC TROMETHAMINE 30 MG/ML IJ SOLN
30.0000 mg | Freq: Once | INTRAMUSCULAR | Status: AC
  Administered 2015-05-16: 30 mg via INTRAVENOUS
  Filled 2015-05-16: qty 1

## 2015-05-16 MED ORDER — ALBUTEROL SULFATE (2.5 MG/3ML) 0.083% IN NEBU
5.0000 mg | INHALATION_SOLUTION | Freq: Once | RESPIRATORY_TRACT | Status: AC
  Administered 2015-05-16: 5 mg via RESPIRATORY_TRACT

## 2015-05-16 MED ORDER — OXYCODONE-ACETAMINOPHEN 5-325 MG PO TABS
1.0000 | ORAL_TABLET | Freq: Once | ORAL | Status: AC
  Administered 2015-05-16: 1 via ORAL
  Filled 2015-05-16: qty 1

## 2015-05-16 MED ORDER — ALBUTEROL SULFATE HFA 108 (90 BASE) MCG/ACT IN AERS
2.0000 | INHALATION_SPRAY | RESPIRATORY_TRACT | Status: DC | PRN
  Administered 2015-05-16: 2 via RESPIRATORY_TRACT
  Filled 2015-05-16: qty 6.7

## 2015-05-16 MED ORDER — PREDNISONE 20 MG PO TABS
60.0000 mg | ORAL_TABLET | Freq: Once | ORAL | Status: AC
  Administered 2015-05-16: 60 mg via ORAL
  Filled 2015-05-16: qty 3

## 2015-05-16 MED ORDER — ONDANSETRON HCL 4 MG/2ML IJ SOLN
4.0000 mg | Freq: Once | INTRAMUSCULAR | Status: AC
  Administered 2015-05-16: 4 mg via INTRAVENOUS
  Filled 2015-05-16: qty 2

## 2015-05-16 MED ORDER — SODIUM CHLORIDE 0.9 % IV BOLUS (SEPSIS)
1000.0000 mL | Freq: Once | INTRAVENOUS | Status: AC
  Administered 2015-05-16: 1000 mL via INTRAVENOUS

## 2015-05-16 MED ORDER — PREDNISONE 20 MG PO TABS: 60.0000 mg | ORAL_TABLET | Freq: Every day | ORAL | Status: DC

## 2015-05-16 NOTE — ED Provider Notes (Signed)
CSN: [id number]     Arrival date & time 05/16/15  0001 History   By signing my name below, I, Arlan Organ, attest that this documentation has been prepared under the direction and in the presence of Shon Baton, MD.  Electronically Signed: Arlan Organ, ED Scribe. 05/16/2015. 2:09 AM.   Chief Complaint  Patient presents with  . Asthma  . Flank Pain   The history is provided by the patient. No language interpreter was used.    HPI Comments: ALEXX MCBURNEY is a 44 y.o. male with a PMHx of HTN, renal disorder, CAD, and asthma who presents to the Emergency Department complaining of constant, ongoing, worsening L flank pain x 1 week. Currently pain is rated 9/10. Discomfort is made worse with certain movements and when coughing. Pt was evaluated at an Emergency Department in [place] approximately 2 weeks ago and was diagnosed with 8 mm kidney stone. Pt was discharged with prescriptions for Cipro, Flomax, and pain medication. He denies any long term improvement with medications. At time of last visit pt was advised to come back in for evaluation if stone did not pass at home within 2 weeks.  **Of note, there is documentation from [facility] and caregiver everywhere that the patient was seen on December 2 and had a CT scan confirming a 5 mm stone. I discussed this with the patient. He is adamant that that was not him. He was given multiple opportunities to discuss this visit.  I discussed the him that I'm happy to treat his pain as he has known disease but he needs to be forthcoming. Patient continues to deny this visit. I have written the note in the description of the patient is accurate as well as the history of recently diagnosed kidney stone.**  Pt also reports ongoing wheezing and productive cough x 1 week. He states symptoms are consistent with a possible asthma exacerbation. At home breathing treatments attempted prior to arrival without any improvement. Pt admits to previous  hospitalizations for exacerbations but denies any necessary intubations. Last hospitalizations a few years ago.  PCP: Pcp Not In System    Past Medical History  Diagnosis Date  . Hypertension   . Obesity   . Arthritis   . Renal disorder   . Depression   . Migraine aura, persistent     since childhood  . Head ache   . Coronary artery disease   . GERD (gastroesophageal reflux disease)   . Asthma    Past Surgical History  Procedure Laterality Date  . Cardiac catheterization      x2  . Appendectomy     Family History  Problem Relation Age of Onset  . Hypertension Mother   . Hyperlipidemia Mother   . Hypertension Brother    Social History  Substance Use Topics  . Smoking status: Current Every Day Smoker -- 0.00 packs/day  . Smokeless tobacco: None  . Alcohol Use: No    Review of Systems  Constitutional: Negative for fever and chills.  Respiratory: Positive for cough and wheezing.   Gastrointestinal: Negative for nausea, vomiting and abdominal pain.  Genitourinary: Positive for flank pain.  Neurological: Negative for headaches.  Psychiatric/Behavioral: Negative for confusion.  All other systems reviewed and are negative.     Allergies  Bee venom; Hornet venom; Aspartame and phenylalanine; Doxycycline; Phenylalanine; and Trazodone and nefazodone  Home Medications   Prior to Admission medications   Medication Sig Start Date End Date Taking? Authorizing Provider  ALPRAZolam (XANAX) 1 MG tablet Take 1 mg by mouth 3 (three) times daily as needed for anxiety.   Yes Historical Provider, MD  aspirin 81 MG chewable tablet Chew 81 mg by mouth daily.   Yes Historical Provider, MD  fluticasone (FLONASE) 50 MCG/ACT nasal spray Place 2 sprays into both nostrils daily as needed for allergies.    Yes Historical Provider, MD  Fluticasone-Salmeterol (ADVAIR) 250-50 MCG/DOSE AEPB Inhale 1 puff into the lungs 2 (two) times daily.   Yes Historical Provider, MD  gabapentin (NEURONTIN)  800 MG tablet Take 800 mg by mouth 3 (three) times daily.   Yes Historical Provider, MD  lisinopril (PRINIVIL,ZESTRIL) 20 MG tablet Take 20 mg by mouth daily.   Yes Historical Provider, MD  metFORMIN (GLUCOPHAGE) 500 MG tablet Take 500 mg by mouth daily with breakfast.   Yes Historical Provider, MD  pravastatin (PRAVACHOL) 40 MG tablet Take 1 tablet (40 mg total) by mouth daily. 10/20/14  Yes Junius FinnerErin O'Malley, PA-C  albuterol (PROVENTIL HFA;VENTOLIN HFA) 108 (90 BASE) MCG/ACT inhaler Inhale 2 puffs into the lungs every 4 (four) hours as needed for wheezing or shortness of breath. 05/16/15   Shon Batonourtney F Emran Molzahn, MD  lisinopril (PRINIVIL,ZESTRIL) 10 MG tablet Take 1 tablet (10 mg total) by mouth daily. Patient not taking: Reported on 05/16/2015 10/20/14   Junius FinnerErin O'Malley, PA-C  oxyCODONE-acetaminophen (PERCOCET/ROXICET) 5-325 MG tablet Take 1-2 tablets by mouth every 6 (six) hours as needed for severe pain. 05/16/15   Shon Batonourtney F Spenser Harren, MD  predniSONE (DELTASONE) 20 MG tablet Take 3 tablets (60 mg total) by mouth daily with breakfast. 05/16/15   Shon Batonourtney F Marl Seago, MD   Triage Vitals: BP 126/72 mmHg  Pulse 65  Temp(Src) 98.2 F (36.8 C) (Oral)  Resp 18  SpO2 98%   Physical Exam  Constitutional: He is oriented to person, place, and time. He appears well-developed and well-nourished.  Obese  HENT:  Head: Normocephalic and atraumatic.  Cardiovascular: Normal rate, regular rhythm and normal heart sounds.   No murmur heard. Pulmonary/Chest: Effort normal. No respiratory distress. He has wheezes.  Abdominal: Soft. Bowel sounds are normal. There is no tenderness. There is no rebound and no guarding.  Musculoskeletal: He exhibits no edema.  Neurological: He is alert and oriented to person, place, and time.  Skin: Skin is warm and dry.  Psychiatric: He has a normal mood and affect.  Nursing note and vitals reviewed.   ED Course  Procedures (including critical care time)  CRITICAL CARE Performed by:  Shon BatonHORTON, Wylma Tatem F   Total critical care time: 40 minutes  Critical care time was exclusive of separately billable procedures and treating other patients.  Critical care was necessary to treat or prevent imminent or life-threatening deterioration.  Critical care was time spent personally by me on the following activities: development of treatment plan with patient and/or surrogate as well as nursing, discussions with consultants, evaluation of patient's response to treatment, examination of patient, obtaining history from patient or surrogate, ordering and performing treatments and interventions, ordering and review of laboratory studies, ordering and review of radiographic studies, pulse oximetry and re-evaluation of patient's condition.   DIAGNOSTIC STUDIES: Oxygen Saturation is 99% on RA, Normal by my interpretation.    COORDINATION OF CARE: 1:53 AM- Will give fluids, Toradol, Morphine, breathing treatment, and Deltasone. Will order US renal, CBC, CMP, and urinalysis. Discussed treatment plan with pt at bedside and pt agreed to plan.     Labs Review Labs Reviewed  URINALYSIS, ROUTINE  W REFLEX MICROSCOPIC (NOT AT [facility] - Abnormal; Notable for the following:    Color, Urine AMBER (*)    APPearance CLOUDY (*)    Hgb urine dipstick LARGE (*)    Leukocytes, UA MODERATE (*)    All other components within normal limits  COMPREHENSIVE METABOLIC PANEL - Abnormal; Notable for the following:    Glucose, Bld 156 (*)    Total Protein 6.1 (*)    Albumin 3.3 (*)    Total Bilirubin 0.1 (*)    All other components within normal limits  URINE MICROSCOPIC-ADD ON - Abnormal; Notable for the following:    Squamous Epithelial / LPF 6-30 (*)    Bacteria, UA FEW (*)    All other components within normal limits  CBC WITH DIFFERENTIAL/PLATELET    Imaging Review US Renal  05/16/2015  CLINICAL DATA:  Acute onset of left flank pain.  Initial encounter. EXAM: RENAL / URINARY TRACT ULTRASOUND COMPLETE  COMPARISON:  CT of the abdomen pelvis performed 05/11/2015, and renal ultrasound performed 02/07/2015 FINDINGS: Right Kidney: Length: 12.7 cm. Echogenicity within normal limits. There is question of a parenchymal calcification at the interpole region of the right kidney. No mass or hydronephrosis visualized. Left Kidney: Length: 13.6 cm. Echogenicity within normal limits. A mild left-sided dromedary hump is noted. No mass or hydronephrosis visualized. Bladder: Appears normal for degree of bladder distention. IMPRESSION: No evidence of hydronephrosis.  Unremarkable renal ultrasound. Electronically Signed   By: Roanna Raider M.D.   On: 05/16/2015 03:34   I have personally reviewed and evaluated these images and lab results as part of my medical decision-making.   EKG Interpretation None      MDM   Final diagnoses:  Asthma exacerbation  Kidney stone    Patient presents with 2 complaints. Asthma exacerbation and persistent pain from kidney stones. Initially his story was inconsistent and he denied being seen last week in [facility]. However, after I personally patient for a second time, he did admit to being seen in [facility]. He stated that he lied because he felt like he would not be treated for his pain. I discussed with the patient lying did not serve him well. I'm willing to treat his pain as he does have a known kidney stone. Patient was also treated for an asthma exacerbation. Review of the narcotic database show he was given 24 Percocet one week ago and based on prescribing information, he would have run out by today. His workup is largely reassuring including renal ultrasound and normal kidney function.  Discussed with the patient at length that he would need to follow-up with urology and continue expectant management at home. Regarding the patient's asthma exacerbation, patient improved after 2 DuoNeb's and prednisone. He was able to ambulate and maintain pulse ox. I discussed with the  patient discharging with an inhaler and prednisone. I will also give him another short course of Percocet giving ongoing pain and evidence of stone on CT scan one week ago at [facility].   After history, exam, and medical workup I feel the patient has been appropriately medically screened and is safe for discharge home. Pertinent diagnoses were discussed with the patient. Patient was given return precautions.   I personally performed the services described in this documentation, which was scribed in my presence. The recorded information has been reviewed and is accurate.   Shon Baton, MD 05/16/15 [id number]

## 2015-05-16 NOTE — ED Notes (Signed)
Pt sitting in chair at bedside. Able to tolerate ginger ale. Requesting pain medication

## 2015-05-16 NOTE — ED Notes (Signed)
Pt ambulated on pulse ox, started out at 100% and went to 93% and Pt had to stop to get his breath.

## 2015-05-16 NOTE — ED Notes (Signed)
Pt. reports asthma attack today with wheezing /productive cough and left flank pain onset last week currently taking Cipro na Flomax - diagnosed with kidney stone .

## 2015-05-16 NOTE — Discharge Instructions (Signed)
Asthma, Adult Asthma is a recurring condition in which the airways tighten and narrow. Asthma can make it difficult to breathe. It can cause coughing, wheezing, and shortness of breath. Asthma episodes, also called asthma attacks, range from minor to life-threatening. Asthma cannot be cured, but medicines and lifestyle changes can help control it. CAUSES Asthma is believed to be caused by inherited (genetic) and environmental factors, but its exact cause is unknown. Asthma may be triggered by allergens, lung infections, or irritants in the air. Asthma triggers are different for each person. Common triggers include:   Animal dander.  Dust mites.  Cockroaches.  Pollen from trees or grass.  Mold.  Smoke.  Air pollutants such as dust, household cleaners, hair sprays, aerosol sprays, paint fumes, strong chemicals, or strong odors.  Cold air, weather changes, and winds (which increase molds and pollens in the air).  Strong emotional expressions such as crying or laughing hard.  Stress.  Certain medicines (such as aspirin) or types of drugs (such as beta-blockers).  Sulfites in foods and drinks. Foods and drinks that may contain sulfites include dried fruit, potato chips, and sparkling grape juice.  Infections or inflammatory conditions such as the flu, a cold, or an inflammation of the nasal membranes (rhinitis).  Gastroesophageal reflux disease (GERD).  Exercise or strenuous activity. SYMPTOMS Symptoms may occur immediately after asthma is triggered or many hours later. Symptoms include:  Wheezing.  Excessive nighttime or early morning coughing.  Frequent or severe coughing with a common cold.  Chest tightness.  Shortness of breath. DIAGNOSIS  The diagnosis of asthma is made by a review of your medical history and a physical exam. Tests may also be performed. These may include:  Lung function studies. These tests show how much air you breathe in and out.  Allergy  tests.  Imaging tests such as X-rays. TREATMENT  Asthma cannot be cured, but it can usually be controlled. Treatment involves identifying and avoiding your asthma triggers. It also involves medicines. There are 2 classes of medicine used for asthma treatment:   Controller medicines. These prevent asthma symptoms from occurring. They are usually taken every day.  Reliever or rescue medicines. These quickly relieve asthma symptoms. They are used as needed and provide short-term relief. Your health care provider will help you create an asthma action plan. An asthma action plan is a written plan for managing and treating your asthma attacks. It includes a list of your asthma triggers and how they may be avoided. It also includes information on when medicines should be taken and when their dosage should be changed. An action plan may also involve the use of a device called a peak flow meter. A peak flow meter measures how well the lungs are working. It helps you monitor your condition. HOME CARE INSTRUCTIONS   Take medicines only as directed by your health care provider. Speak with your health care provider if you have questions about how or when to take the medicines.  Use a peak flow meter as directed by your health care provider. Record and keep track of readings.  Understand and use the action plan to help minimize or stop an asthma attack without needing to seek medical care.  Control your home environment in the following ways to help prevent asthma attacks:  Do not smoke. Avoid being exposed to secondhand smoke.  Change your heating and air conditioning filter regularly.  Limit your use of fireplaces and wood stoves.  Get rid of pests (such as roaches  and mice) and their droppings.  Throw away plants if you see mold on them.  Clean your floors and dust regularly. Use unscented cleaning products.  Try to have someone else vacuum for you regularly. Stay out of rooms while they are  being vacuumed and for a short while afterward. If you vacuum, use a dust mask from a hardware store, a double-layered or microfilter vacuum cleaner bag, or a vacuum cleaner with a HEPA filter.  Replace carpet with wood, tile, or vinyl flooring. Carpet can trap dander and dust.  Use allergy-proof pillows, mattress covers, and box spring covers.  Wash bed sheets and blankets every week in hot water and dry them in a dryer.  Use blankets that are made of polyester or cotton.  Clean bathrooms and kitchens with bleach. If possible, have someone repaint the walls in these rooms with mold-resistant paint. Keep out of the rooms that are being cleaned and painted.  Wash hands frequently. SEEK MEDICAL CARE IF:   You have wheezing, shortness of breath, or a cough even if taking medicine to prevent attacks.  The colored mucus you cough up (sputum) is thicker than usual.  Your sputum changes from clear or white to yellow, green, gray, or bloody.  You have any problems that may be related to the medicines you are taking (such as a rash, itching, swelling, or trouble breathing).  You are using a reliever medicine more than 2-3 times per week.  Your peak flow is still at 50-79% of your personal best after following your action plan for 1 hour.  You have a fever. SEEK IMMEDIATE MEDICAL CARE IF:   You seem to be getting worse and are unresponsive to treatment during an asthma attack.  You are short of breath even at rest.  You get short of breath when doing very little physical activity.  You have difficulty eating, drinking, or talking due to asthma symptoms.  You develop chest pain.  You develop a fast heartbeat.  You have a bluish color to your lips or fingernails.  You are light-headed, dizzy, or faint.  Your peak flow is less than 50% of your personal best.   This information is not intended to replace advice given to you by your health care provider. Make sure you discuss any  questions you have with your health care provider.   Document Released: 05/26/2005 Document Revised: 02/14/2015 Document Reviewed: 12/23/2012 Elsevier Interactive Patient Education 2016 Else[employer] Inc[employer] Kidney Stones Kidney stones (urolithiasis) are deposits that form inside your kidneys. The intense pain is caused by the stone moving through the urinary tract. When the stone moves, the ureter goes into spasm around the stone. The stone is usually passed in the urine.  CAUSES   A disorder that makes certain neck glands produce too much parathyroid hormone (primary hyperparathyroidism).  A buildup of uric acid crystals, similar to gout in your joints.  Narrowing (stricture) of the ureter.  A kidney obstruction present at birth (congenital obstruction).  Previous surgery on the kidney or ureters.  Numerous kidney infections. SYMPTOMS   Feeling sick to your stomach (nauseous).  Throwing up (vomiting).  Blood in the urine (hematuria).  Pain that usually spreads (radiates) to the groin.  Frequency or urgency of urination. DIAGNOSIS   Taking a history and physical exam.  Blood or urine tests.  CT scan.  Occasionally, an examination of the inside of the urinary bladder (cystoscopy) is performed. TREATMENT   Observation.  Increasing your fluid intake.  Extracorporeal  shock wave lithotripsy--This is a noninvasive procedure that uses shock waves to break up kidney stones.  Surgery may be needed if you have severe pain or persistent obstruction. There are various surgical procedures. Most of the procedures are performed with the use of small instruments. Only small incisions are needed to accommodate these instruments, so recovery time is minimized. The size, location, and chemical composition are all important variables that will determine the proper choice of action for you. Talk to your health care provider to better understand your situation so that you will minimize the risk  of injury to yourself and your kidney.  HOME CARE INSTRUCTIONS   Drink enough water and fluids to keep your urine clear or pale yellow. This will help you to pass the stone or stone fragments.  Strain all urine through the provided strainer. Keep all particulate matter and stones for your health care provider to see. The stone causing the pain may be as small as a grain of salt. It is very important to use the strainer each and every time you pass your urine. The collection of your stone will allow your health care provider to analyze it and verify that a stone has actually passed. The stone analysis will often identify what you can do to reduce the incidence of recurrences.  Only take over-the-counter or prescription medicines for pain, discomfort, or fever as directed by your health care provider.  Keep all follow-up visits as told by your health care provider. This is important.  Get follow-up X-rays if required. The absence of pain does not always mean that the stone has passed. It may have only stopped moving. If the urine remains completely obstructed, it can cause loss of kidney function or even complete destruction of the kidney. It is your responsibility to make sure X-rays and follow-ups are completed. Ultrasounds of the kidney can show blockages and the status of the kidney. Ultrasounds are not associated with any radiation and can be performed easily in a matter of minutes.  Make changes to your daily diet as told by your health care provider. You may be told to:  Limit the amount of salt that you eat.  Eat 5 or more servings of fruits and vegetables each day.  Limit the amount of meat, poultry, fish, and eggs that you eat.  Collect a 24-hour urine sample as told by your health care provider.You may need to collect another urine sample every 6-12 months. SEEK MEDICAL CARE IF:  You experience pain that is progressive and unresponsive to any pain medicine you have been  prescribed. SEEK IMMEDIATE MEDICAL CARE IF:   Pain cannot be controlled with the prescribed medicine.  You have a fever or shaking chills.  The severity or intensity of pain increases over 18 hours and is not relieved by pain medicine.  You develop a new onset of abdominal pain.  You feel faint or pass out.  You are unable to urinate.   This information is not intended to replace advice given to you by your health care provider. Make sure you discuss any questions you have with your health care provider.   Document Released: 05/26/2005 Document Revised: 02/14/2015 Document Reviewed: 10/27/2012 Elsevier Interactive Patient Education [employer]

## 2015-05-24 ENCOUNTER — Encounter (HOSPITAL_COMMUNITY): Payer: Self-pay | Admitting: Emergency Medicine

## 2015-05-24 DIAGNOSIS — E669 Obesity, unspecified: Secondary | ICD-10-CM | POA: Insufficient documentation

## 2015-05-24 DIAGNOSIS — Z960 Presence of urogenital implants: Secondary | ICD-10-CM | POA: Insufficient documentation

## 2015-05-24 DIAGNOSIS — I2510 Atherosclerotic heart disease of native coronary artery without angina pectoris: Secondary | ICD-10-CM | POA: Insufficient documentation

## 2015-05-24 DIAGNOSIS — Q549 Hypospadias, unspecified: Secondary | ICD-10-CM | POA: Insufficient documentation

## 2015-05-24 DIAGNOSIS — Z87442 Personal history of urinary calculi: Secondary | ICD-10-CM | POA: Insufficient documentation

## 2015-05-24 DIAGNOSIS — Z7952 Long term (current) use of systemic steroids: Secondary | ICD-10-CM | POA: Insufficient documentation

## 2015-05-24 DIAGNOSIS — F329 Major depressive disorder, single episode, unspecified: Secondary | ICD-10-CM | POA: Insufficient documentation

## 2015-05-24 DIAGNOSIS — J45909 Unspecified asthma, uncomplicated: Secondary | ICD-10-CM | POA: Insufficient documentation

## 2015-05-24 DIAGNOSIS — I10 Essential (primary) hypertension: Secondary | ICD-10-CM | POA: Insufficient documentation

## 2015-05-24 DIAGNOSIS — M1990 Unspecified osteoarthritis, unspecified site: Secondary | ICD-10-CM | POA: Insufficient documentation

## 2015-05-24 DIAGNOSIS — Z9889 Other specified postprocedural states: Secondary | ICD-10-CM | POA: Insufficient documentation

## 2015-05-24 DIAGNOSIS — N390 Urinary tract infection, site not specified: Secondary | ICD-10-CM | POA: Insufficient documentation

## 2015-05-24 DIAGNOSIS — F17200 Nicotine dependence, unspecified, uncomplicated: Secondary | ICD-10-CM | POA: Insufficient documentation

## 2015-05-24 DIAGNOSIS — Z79899 Other long term (current) drug therapy: Secondary | ICD-10-CM | POA: Insufficient documentation

## 2015-05-24 DIAGNOSIS — G43909 Migraine, unspecified, not intractable, without status migrainosus: Secondary | ICD-10-CM | POA: Insufficient documentation

## 2015-05-24 DIAGNOSIS — Z8719 Personal history of other diseases of the digestive system: Secondary | ICD-10-CM | POA: Insufficient documentation

## 2015-05-24 DIAGNOSIS — Z7982 Long term (current) use of aspirin: Secondary | ICD-10-CM | POA: Insufficient documentation

## 2015-05-24 DIAGNOSIS — Z7951 Long term (current) use of inhaled steroids: Secondary | ICD-10-CM | POA: Insufficient documentation

## 2015-05-24 LAB — CBC
HEMATOCRIT: 42.9 % (ref 39.0–52.0)
Hemoglobin: 13.7 g/dL (ref 13.0–17.0)
MCH: 28 pg (ref 26.0–34.0)
MCHC: 31.9 g/dL (ref 30.0–36.0)
MCV: 87.6 fL (ref 78.0–100.0)
Platelets: 288 10*3/uL (ref 150–400)
RBC: 4.9 MIL/uL (ref 4.22–5.81)
RDW: 14.7 % (ref 11.5–15.5)
WBC: 9.7 10*3/uL (ref 4.0–10.5)

## 2015-05-24 LAB — URINE MICROSCOPIC-ADD ON

## 2015-05-24 LAB — URINALYSIS, ROUTINE W REFLEX MICROSCOPIC
GLUCOSE, UA: NEGATIVE mg/dL
Ketones, ur: 15 mg/dL — AB
Nitrite: POSITIVE — AB
PH: 5.5 (ref 5.0–8.0)
Protein, ur: >300 mg/dL — AB
SPECIFIC GRAVITY, URINE: 1.021 (ref 1.005–1.030)

## 2015-05-24 NOTE — ED Notes (Signed)
Pt. reports pain across his abdomen/left groin pain  with dysuria , nausea and vomitting onset today , pt. also reported constipation for several days , pt. stated stent placed yesterday at [facility] for his kidney stones.

## 2015-05-25 ENCOUNTER — Emergency Department (HOSPITAL_COMMUNITY)
Admission: EM | Admit: 2015-05-25 | Discharge: 2015-05-25 | Disposition: A | Discharge status: Home / Self Care | Payer: Medicaid - Out of State | Attending: Emergency Medicine | Admitting: Emergency Medicine

## 2015-05-25 DIAGNOSIS — N390 Urinary tract infection, site not specified: Secondary | ICD-10-CM

## 2015-05-25 LAB — COMPREHENSIVE METABOLIC PANEL
ALBUMIN: 3.5 g/dL (ref 3.5–5.0)
ALK PHOS: 49 U/L (ref 38–126)
ALT: 23 U/L (ref 17–63)
ANION GAP: 8 (ref 5–15)
AST: 30 U/L (ref 15–41)
BILIRUBIN TOTAL: 0.2 mg/dL — AB (ref 0.3–1.2)
BUN: 16 mg/dL (ref 6–20)
CALCIUM: 9 mg/dL (ref 8.9–10.3)
CO2: 25 mmol/L (ref 22–32)
Chloride: 106 mmol/L (ref 101–111)
Creatinine, Ser: 1.05 mg/dL (ref 0.61–1.24)
GLUCOSE: 119 mg/dL — AB (ref 65–99)
Potassium: 3.5 mmol/L (ref 3.5–5.1)
Sodium: 139 mmol/L (ref 135–145)
TOTAL PROTEIN: 6.3 g/dL — AB (ref 6.5–8.1)

## 2015-05-25 LAB — LIPASE, BLOOD: Lipase: 93 U/L — ABNORMAL HIGH (ref 11–51)

## 2015-05-25 MED ORDER — LEVOFLOXACIN 750 MG PO TABS
750.0000 mg | ORAL_TABLET | Freq: Once | ORAL | Status: AC
  Administered 2015-05-25: 750 mg via ORAL
  Filled 2015-05-25: qty 1

## 2015-05-25 MED ORDER — LEVOFLOXACIN 750 MG PO TABS: 750.0000 mg | ORAL_TABLET | Freq: Every day | ORAL | Status: DC

## 2015-05-25 MED ORDER — HYDROMORPHONE HCL 1 MG/ML IJ SOLN
1.0000 mg | Freq: Once | INTRAMUSCULAR | Status: AC
  Administered 2015-05-25: 1 mg via INTRAMUSCULAR
  Filled 2015-05-25: qty 1

## 2015-05-25 NOTE — Discharge Instructions (Signed)
Urinary Tract Infection Richard Anderson, your urine shows an infection. Use the attached coupon to get your antibiotics for $10.  See your urologist within 3 days for close follow up.  If symptoms worsen, come back to the ED immediately.  Thank you. A urinary tract infection (UTI) can occur any place along the urinary tract. The tract includes the kidneys, ureters, bladder, and urethra. A type of germ called bacteria often causes a UTI. UTIs are often helped with antibiotic medicine.  HOME CARE   If given, take antibiotics as told by your doctor. Finish them even if you start to feel better.  Drink enough fluids to keep your pee (urine) clear or pale yellow.  Avoid tea, drinks with caffeine, and bubbly (carbonated) drinks.  Pee often. Avoid holding your pee in for a long time.  Pee before and after having sex (intercourse).  Wipe from front to back after you poop (bowel movement) if you are a woman. Use each tissue only once. GET HELP RIGHT AWAY IF:   You have back pain.  You have lower belly (abdominal) pain.  You have chills.  You feel sick to your stomach (nauseous).  You throw up (vomit).  Your burning or discomfort with peeing does not go away.  You have a fever.  Your symptoms are not better in 3 days. MAKE SURE YOU:   Understand these instructions.  Will watch your condition.  Will get help right away if you are not doing well or get worse.   This information is not intended to replace advice given to you by your health care provider. Make sure you discuss any questions you have with your health care provider.   Document Released: 11/12/2007 Document Revised: 06/16/2014 Document Reviewed: 12/25/2011 Elsevier Interactive Patient Education [employer] Elsevier Inc[employer]

## 2015-05-25 NOTE — ED Notes (Signed)
Pt departed in NAD.  

## 2015-05-25 NOTE — ED Provider Notes (Signed)
CSN: [id number]     Arrival date & time 05/24/15  2305 History  By signing my name below, I, Bethel BornBritney McCollum, attest that this documentation has been prepared under the direction and in the presence of Tomasita CrumbleAdeleke Rebekah Sprinkle, MD. Electronically Signed: Bethel BornBritney McCollum, ED Scribe. 05/25/2015. 1:03 AM   Chief Complaint  Patient presents with  . Abdominal Pain    The history is provided by the patient. No language interpreter was used.   Richard Anderson is a 44 y.o. male with history of kidney stones 1 day s/p stent placement [facility] who presents to the Emergency Department complaining of severe and diffuse abdominal pain with onset this morning upon waking. Percocet and Vicodin have provided insufficient pain relief at home. Associated symptoms include chills, groin pain and swelling (L>R), left-sided back pain, dysuria, hematuria, and left leg pain. Pt denies fever. After the stent placement yesterday he was started on ampicillin. The stent is scheduled to be removed on 05/29/15. He states that he has had issues with stent and catheter placement in the past secondary to hypospadia.   Past Medical History  Diagnosis Date  . Hypertension   . Obesity   . Arthritis   . Renal disorder   . Depression   . Migraine aura, persistent     since childhood  . Head ache   . Coronary artery disease   . GERD (gastroesophageal reflux disease)   . Asthma    Past Surgical History  Procedure Laterality Date  . Cardiac catheterization      x2  . Appendectomy     Family History  Problem Relation Age of Onset  . Hypertension Mother   . Hyperlipidemia Mother   . Hypertension Brother    Social History  Substance Use Topics  . Smoking status: Current Every Day Smoker -- 0.00 packs/day  . Smokeless tobacco: None  . Alcohol Use: No    Review of Systems 10 Systems reviewed and all are negative for acute change except as noted in the HPI.  Allergies  Bee venom; Hornet venom; Aspartame and  phenylalanine; Doxycycline; Phenylalanine; and Trazodone and nefazodone  Home Medications   Prior to Admission medications   Medication Sig Start Date End Date Taking? Authorizing Provider  albuterol (PROVENTIL HFA;VENTOLIN HFA) 108 (90 BASE) MCG/ACT inhaler Inhale 2 puffs into the lungs every 4 (four) hours as needed for wheezing or shortness of breath. 05/16/15   Shon Batonourtney F Horton, MD  ALPRAZolam [name](XANAX) 1 MG tablet Take 1 mg by mouth 3 (three) times daily as needed for anxiety.    Historical Provider, MD  aspirin 81 MG chewable tablet Chew 81 mg by mouth daily.    Historical Provider, MD  fluticasone (FLONASE) 50 MCG/ACT nasal spray Place 2 sprays into both nostrils daily as needed for allergies.     Historical Provider, MD  Fluticasone-Salmeterol (ADVAIR) 250-50 MCG/DOSE AEPB Inhale 1 puff into the lungs 2 (two) times daily.    Historical Provider, MD  gabapentin (NEURONTIN) 800 MG tablet Take 800 mg by mouth 3 (three) times daily.    Historical Provider, MD  lisinopril (PRINIVIL,ZESTRIL) 10 MG tablet Take 1 tablet (10 mg total) by mouth daily. Patient not taking: Reported on 05/16/2015 10/20/14   Junius FinnerErin O'Malley, PA-C  lisinopril (PRINIVIL,ZESTRIL) 20 MG tablet Take 20 mg by mouth daily.    Historical Provider, MD  metFORMIN (GLUCOPHAGE) 500 MG tablet Take 500 mg by mouth daily with breakfast.    Historical Provider, MD  oxyCODONE-acetaminophen (  PERCOCET/ROXICET) 5-325 MG tablet Take 1-2 tablets by mouth every 6 (six) hours as needed for severe pain. 05/16/15   Shon Baton, MD  pravastatin (PRAVACHOL) 40 MG tablet Take 1 tablet (40 mg total) by mouth daily. 10/20/14   [name], PA-C  predniSONE (DELTASONE) 20 MG tablet Take 3 tablets (60 mg total) by mouth daily with breakfast. 05/16/15   Shon Baton, MD   BP 148/107 mmHg  Pulse 86  Temp(Src) 98.9 F (37.2 C) (Oral)  Resp 16  SpO2 96% Physical Exam  Constitutional: He is oriented to person, place, and time. Vital signs are  normal. He appears well-developed and well-nourished.  Non-toxic appearance. He does not appear ill. No distress.  HENT:  Head: Normocephalic and atraumatic.  Nose: Nose normal.  Mouth/Throat: Oropharynx is clear and moist. No oropharyngeal exudate.  Eyes: Conjunctivae and EOM are normal. Pupils are equal, round, and reactive to light. No scleral icterus.  Neck: Normal range of motion. Neck supple. No tracheal deviation, no edema, no erythema and normal range of motion present. No thyroid mass and no thyromegaly present.  Cardiovascular: Normal rate, regular rhythm, S1 normal, S2 normal, normal heart sounds, intact distal pulses and normal pulses.  Exam reveals no gallop and no friction rub.   No murmur heard. Pulmonary/Chest: Effort normal and breath sounds normal. No respiratory distress. He has no wheezes. He has no rhonchi. He has no rales.  Abdominal: Soft. Normal appearance and bowel sounds are normal. He exhibits no distension, no ascites and no mass. There is no hepatosplenomegaly. There is no tenderness. There is no rebound, no guarding and no CVA tenderness.  Genitourinary:  Hypospadia  Musculoskeletal: Normal range of motion. He exhibits no edema or tenderness.  Lymphadenopathy:    He has no cervical adenopathy.  Neurological: He is alert and oriented to person, place, and time. He has normal strength. No cranial nerve deficit or sensory deficit.  Skin: Skin is warm, dry and intact. No petechiae and no rash noted. He is not diaphoretic. No erythema. No pallor.  Psychiatric: He has a normal mood and affect. His behavior is normal. Judgment normal.  Nursing note and vitals reviewed.   ED Course  Procedures (including critical care time) DIAGNOSTIC STUDIES: Oxygen Saturation is 96% on RA,  normal by my interpretation.    COORDINATION OF CARE: 12:56 AM Discussed treatment plan which includes lab work, pain management, and abx with pt at bedside and pt agreed to plan.  Labs  Review Labs Reviewed  LIPASE, BLOOD - Abnormal; Notable for the following:    Lipase 93 (*)    All other components within normal limits  COMPREHENSIVE METABOLIC PANEL - Abnormal; Notable for the following:    Glucose, Bld 119 (*)    Total Protein 6.3 (*)    Total Bilirubin 0.2 (*)    All other components within normal limits  URINALYSIS, ROUTINE W REFLEX MICROSCOPIC (NOT AT [facility]) - Abnormal; Notable for the following:    Color, Urine RED (*)    APPearance TURBID (*)    Hgb urine dipstick LARGE (*)    Bilirubin Urine LARGE (*)    Ketones, ur 15 (*)    Protein, ur >300 (*)    Nitrite POSITIVE (*)    Leukocytes, UA MODERATE (*)    All other components within normal limits  URINE MICROSCOPIC-ADD ON - Abnormal; Notable for the following:    Squamous Epithelial / LPF 0-5 (*)    Bacteria, UA FEW (*)  All other components within normal limits  CBC    Imaging Review No results found. I have personally reviewed and evaluated these lab results as part of my medical decision-making.   EKG Interpretation None      MDM   Final diagnoses:  None    Patient presents emergency department for groin pain, dysuria and hematuria after stent placement. Urinalysis shows significant infection. He states he is currently on ampicillin for possible UTI prevention, will replace this with Levaquin. He was also given Dilaudid intramuscularly emergency department.  He was given goodrx coupon for levoquin as he states he has no insurance.  He appears well and in NAD.  VS remain within his normal limits and he is safe for DC.  I personally performed the services described in this documentation, which was scribed in my presence. The recorded information has been reviewed and is accurate.      Tomasita Crumble, MD 05/25/15 [phone]

## 2015-07-17 ENCOUNTER — Encounter (HOSPITAL_COMMUNITY): Payer: Self-pay | Admitting: Cardiology

## 2015-07-17 ENCOUNTER — Emergency Department (HOSPITAL_COMMUNITY)
Admission: EM | Admit: 2015-07-17 | Discharge: 2015-07-17 | Disposition: A | Discharge status: Home / Self Care | Payer: Medicaid - Out of State | Attending: Emergency Medicine | Admitting: Emergency Medicine

## 2015-07-17 DIAGNOSIS — Z8669 Personal history of other diseases of the nervous system and sense organs: Secondary | ICD-10-CM | POA: Insufficient documentation

## 2015-07-17 DIAGNOSIS — F17210 Nicotine dependence, cigarettes, uncomplicated: Secondary | ICD-10-CM | POA: Insufficient documentation

## 2015-07-17 DIAGNOSIS — J45909 Unspecified asthma, uncomplicated: Secondary | ICD-10-CM | POA: Insufficient documentation

## 2015-07-17 DIAGNOSIS — R0981 Nasal congestion: Secondary | ICD-10-CM | POA: Insufficient documentation

## 2015-07-17 DIAGNOSIS — G43509 Persistent migraine aura without cerebral infarction, not intractable, without status migrainosus: Secondary | ICD-10-CM | POA: Insufficient documentation

## 2015-07-17 DIAGNOSIS — Z9889 Other specified postprocedural states: Secondary | ICD-10-CM | POA: Insufficient documentation

## 2015-07-17 DIAGNOSIS — I2510 Atherosclerotic heart disease of native coronary artery without angina pectoris: Secondary | ICD-10-CM | POA: Insufficient documentation

## 2015-07-17 DIAGNOSIS — E785 Hyperlipidemia, unspecified: Secondary | ICD-10-CM | POA: Insufficient documentation

## 2015-07-17 DIAGNOSIS — Z87448 Personal history of other diseases of urinary system: Secondary | ICD-10-CM | POA: Insufficient documentation

## 2015-07-17 DIAGNOSIS — R1033 Periumbilical pain: Secondary | ICD-10-CM

## 2015-07-17 DIAGNOSIS — E669 Obesity, unspecified: Secondary | ICD-10-CM | POA: Insufficient documentation

## 2015-07-17 DIAGNOSIS — R197 Diarrhea, unspecified: Secondary | ICD-10-CM | POA: Insufficient documentation

## 2015-07-17 DIAGNOSIS — I10 Essential (primary) hypertension: Secondary | ICD-10-CM | POA: Insufficient documentation

## 2015-07-17 DIAGNOSIS — M1990 Unspecified osteoarthritis, unspecified site: Secondary | ICD-10-CM | POA: Insufficient documentation

## 2015-07-17 DIAGNOSIS — Z8719 Personal history of other diseases of the digestive system: Secondary | ICD-10-CM | POA: Insufficient documentation

## 2015-07-17 DIAGNOSIS — Z7982 Long term (current) use of aspirin: Secondary | ICD-10-CM | POA: Insufficient documentation

## 2015-07-17 DIAGNOSIS — R1110 Vomiting, unspecified: Secondary | ICD-10-CM | POA: Insufficient documentation

## 2015-07-17 DIAGNOSIS — R6883 Chills (without fever): Secondary | ICD-10-CM | POA: Insufficient documentation

## 2015-07-17 LAB — URINE MICROSCOPIC-ADD ON

## 2015-07-17 LAB — COMPREHENSIVE METABOLIC PANEL
ALT: 28 U/L (ref 17–63)
AST: 23 U/L (ref 15–41)
Albumin: 3.4 g/dL — ABNORMAL LOW (ref 3.5–5.0)
Alkaline Phosphatase: 55 U/L (ref 38–126)
Anion gap: 10 (ref 5–15)
BUN: 17 mg/dL (ref 6–20)
CO2: 24 mmol/L (ref 22–32)
Calcium: 8.7 mg/dL — ABNORMAL LOW (ref 8.9–10.3)
Chloride: 107 mmol/L (ref 101–111)
Creatinine, Ser: 1.08 mg/dL (ref 0.61–1.24)
GFR calc Af Amer: >60 mL/min (ref >60)
GFR calc non Af Amer: >60 mL/min (ref >60)
Glucose, Bld: 151 mg/dL — ABNORMAL HIGH (ref 65–99)
Potassium: 3.9 mmol/L (ref 3.5–5.1)
Sodium: 141 mmol/L (ref 135–145)
Total Bilirubin: 0.5 mg/dL (ref 0.3–1.2)
Total Protein: 6.6 g/dL (ref 6.5–8.1)

## 2015-07-17 LAB — CBC
HEMATOCRIT: 46.2 % (ref 39.0–52.0)
HEMOGLOBIN: 14.5 g/dL (ref 13.0–17.0)
MCH: 27.1 pg (ref 26.0–34.0)
MCHC: 31.4 g/dL (ref 30.0–36.0)
MCV: 86.4 fL (ref 78.0–100.0)
Platelets: 301 10*3/uL (ref 150–400)
RBC: 5.35 MIL/uL (ref 4.22–5.81)
RDW: 14.2 % (ref 11.5–15.5)
WBC: 8.3 10*3/uL (ref 4.0–10.5)

## 2015-07-17 LAB — URINALYSIS, ROUTINE W REFLEX MICROSCOPIC
BILIRUBIN URINE: NEGATIVE
Glucose, UA: NEGATIVE mg/dL
HGB URINE DIPSTICK: NEGATIVE
Ketones, ur: NEGATIVE mg/dL
Nitrite: NEGATIVE
PH: 5.5 (ref 5.0–8.0)
Protein, ur: NEGATIVE mg/dL
SPECIFIC GRAVITY, URINE: 1.026 (ref 1.005–1.030)

## 2015-07-17 LAB — LIPASE, BLOOD: Lipase: 26 U/L (ref 11–51)

## 2015-07-17 MED ORDER — ONDANSETRON HCL 4 MG/2ML IJ SOLN: 4.0000 mg | Freq: Once | INTRAMUSCULAR | Status: DC

## 2015-07-17 MED ORDER — HYDROCODONE-ACETAMINOPHEN 5-325 MG PO TABS: 1.0000 | ORAL_TABLET | Freq: Once | ORAL | Status: DC

## 2015-07-17 MED ORDER — ALBUTEROL SULFATE HFA 108 (90 BASE) MCG/ACT IN AERS
2.0000 | INHALATION_SPRAY | RESPIRATORY_TRACT | Status: DC | PRN
  Administered 2015-07-17: 2 via RESPIRATORY_TRACT
  Filled 2015-07-17: qty 6.7

## 2015-07-17 MED ORDER — OXYCODONE-ACETAMINOPHEN 5-325 MG PO TABS
1.0000 | ORAL_TABLET | Freq: Once | ORAL | Status: AC
Start: 2015-07-17 — End: 2015-07-17
  Administered 2015-07-17: 1 via ORAL
  Filled 2015-07-17: qty 1

## 2015-07-17 MED ORDER — SODIUM CHLORIDE 0.9 % IV BOLUS (SEPSIS): 1000.0000 mL | Freq: Once | INTRAVENOUS | Status: DC

## 2015-07-17 MED ORDER — PROMETHAZINE HCL 25 MG RE SUPP: 25.0000 mg | Freq: Four times a day (QID) | RECTAL | Status: DC | PRN

## 2015-07-17 MED ORDER — ONDANSETRON HCL 4 MG PO TABS: 4.0000 mg | ORAL_TABLET | Freq: Four times a day (QID) | ORAL | Status: DC

## 2015-07-17 MED ORDER — ONDANSETRON 4 MG PO TBDP
4.0000 mg | ORAL_TABLET | Freq: Once | ORAL | Status: AC
  Administered 2015-07-17: 4 mg via ORAL
  Filled 2015-07-17: qty 1

## 2015-07-17 MED ORDER — KETOROLAC TROMETHAMINE 60 MG/2ML IM SOLN: 60.0000 mg | Freq: Once | INTRAMUSCULAR | Status: DC

## 2015-07-17 MED ORDER — MORPHINE SULFATE (PF) 4 MG/ML IV SOLN: 4.0000 mg | Freq: Once | INTRAVENOUS | Status: DC

## 2015-07-17 MED ORDER — PENICILLIN G BENZATHINE 1200000 UNIT/2ML IM SUSP: 1.2000 10*6.[IU] | Freq: Once | INTRAMUSCULAR | Status: DC

## 2015-07-17 NOTE — ED Notes (Signed)
Pt given ginger ale.

## 2015-07-17 NOTE — ED Notes (Signed)
Pt reports congestion, and lower abd pain with diarrhea for the past couple of days. States he has been unable to keep anything down.

## 2015-07-17 NOTE — Discharge Instructions (Signed)
Return to the ED with any concerns including vomiting and not able to keep down liquids, worsening abdominal pain, fainting, fever/chills, decreased level of alertness/lethargy, or any other alarming symptoms °

## 2015-07-17 NOTE — ED Provider Notes (Signed)
CSN: [id number]     Arrival date & time 07/17/15  0827 History   First MD Initiated Contact with Patient 07/17/15 [phone]     Chief Complaint  Patient presents with  . Nasal Congestion  . Abdominal Pain  . Diarrhea     (Consider location/radiation/quality/duration/timing/severity/associated sxs/prior Treatment) HPI  Pt presenting with c/o vomiting and diarrhea mid abdominal pain.  He also c/o feeling congested. He states he has had nasal congestion for the past week and then 4 days ago began to have vomiting and diarrhea.  Emesis is nonbloody and nonbilious.  Abdominal pain is sharp and located mid abdomen and to the left.  No fever.  Has had some chills.  No cough or difficulty breathing.  No known sick contacts.  No recent travel.  There are no other associated systemic symptoms, there are no other alleviating or modifying factors.   Past Medical History  Diagnosis Date  . Hypertension   . Obesity   . Arthritis   . Renal disorder   . Depression   . Migraine aura, persistent     since childhood  . Head ache   . Coronary artery disease   . GERD (gastroesophageal reflux disease)   . Asthma   . Suicide attempt by drug ingestion [facility]) 11/05/2010    After his father died  . Hyperlipidemia   . Hyperglycemia   . OSA (obstructive sleep apnea)   . Drug abuse, cocaine type    Past Surgical History  Procedure Laterality Date  . Cardiac catheterization      x2, last one in 11-05-2010, luminal irregularities in multiple vessels, EF 45%  . Appendectomy     Family History  Problem Relation Age of Onset  . Hypertension Mother   . Hyperlipidemia Mother   . Hypertension Brother   . Leukemia Brother   . Congestive Heart Failure Sister    Social History  Substance Use Topics  . Smoking status: Current Every Day Smoker -- 0.00 packs/day    Types: Cigarettes  . Smokeless tobacco: None  . Alcohol Use: No    Review of Systems  ROS reviewed and all otherwise negative except for mentioned in  HPI    Allergies  Bee venom; Hornet venom; Aspartame and phenylalanine; Doxycycline; Phenylalanine; and Trazodone and nefazodone  Home Medications   Prior to Admission medications   Medication Sig Start Date End Date Taking? Authorizing Provider  albuterol (PROVENTIL HFA;VENTOLIN HFA) 108 (90 BASE) MCG/ACT inhaler Inhale 2 puffs into the lungs every 4 (four) hours as needed for wheezing or shortness of breath. 05/16/15   Shon Baton, MD  aspirin 81 MG chewable tablet Chew 81 mg by mouth daily.    Historical Provider, MD  fluticasone (FLONASE) 50 MCG/ACT nasal spray Place 2 sprays into both nostrils daily as needed for allergies.     Historical Provider, MD  gabapentin (NEURONTIN) 800 MG tablet Take 800 mg by mouth 3 (three) times daily.    Historical Provider, MD  lisinopril (PRINIVIL,ZESTRIL) 20 MG tablet Take 20 mg by mouth daily.    Historical Provider, MD  pravastatin (PRAVACHOL) 40 MG tablet Take 1 tablet (40 mg total) by mouth daily. 10/20/14   [name], PA-C   BP 142/62 mmHg  Pulse 81  Temp(Src) 98.5 F (36.9 C) (Oral)  Resp 20  Ht  (1.727 m)  Wt 185.975 kg  BMI 62.35 kg/m2  SpO2 95%  Vitals reviewed Physical Exam  Physical Examination: General appearance - alert, well  appearing, and in no distress Mental status - alert, oriented to person, place, and time Eyes - no conjunctival injection, no scleral icterus Mouth - mucous membranes moist, pharynx normal without lesions Chest - clear to auscultation, no wheezes, rales or rhonchi, symmetric air entry Heart - normal rate, regular rhythm, normal S1, S2, no murmurs, rubs, clicks or gallops Abdomen - soft, nontender, nondistended, no masses or organomegaly Neurological - alert, oriented, normal speech Extremities - peripheral pulses normal, no pedal edema, no clubbing or cyanosis Skin - normal coloration and turgor, no rashes  ED Course  Procedures (including critical care time) Labs Review Labs Reviewed   COMPREHENSIVE METABOLIC PANEL - Abnormal; Notable for the following:    Glucose, Bld 151 (*)    Calcium 8.7 (*)    Albumin 3.4 (*)    All other components within normal limits  URINALYSIS, ROUTINE W REFLEX MICROSCOPIC (NOT AT [facility]) - Abnormal; Notable for the following:    APPearance CLOUDY (*)    Leukocytes, UA SMALL (*)    All other components within normal limits  URINE MICROSCOPIC-ADD ON - Abnormal; Notable for the following:    Squamous Epithelial / LPF 6-30 (*)    Bacteria, UA FEW (*)    Crystals CA OXALATE CRYSTALS (*)    All other components within normal limits  LIPASE, BLOOD  CBC    Imaging Review Dg Chest 2 View  07/19/2015  CLINICAL DATA:  Shortness of breath with exertion. EXAM: CHEST  2 VIEW COMPARISON:  None. FINDINGS: The heart size and mediastinal contours are within normal limits. Both lungs are clear. The visualized skeletal structures are unremarkable. IMPRESSION: No active cardiopulmonary disease. Electronically Signed   By: Elige Ko   On: 07/19/2015 12:25   I have personally reviewed and evaluated these images and lab results as part of my medical decision-making.   EKG Interpretation None      MDM   Final diagnoses:  Vomiting and diarrhea  Periumbilical abdominal pain    Pt presenting with c/o left sided abdominal pain as well as vomiting and diarrhea and congestion.  Pt has reasuring labs, contaminated urine specimen.   Pt treated with pain and nausea meds.  Per chart review patient was recently seen for similar symptoms.  Pt has had no vomiting in the ED and has been able to tolerate po fluids.  Abdominal exam is benign.  Pt appears well hydrated.  Normal work of breathing.  Discharged with strict return precautions.  Pt agreeable with plan.    Jerelyn Scott, MD 07/20/15 2126

## 2015-07-17 NOTE — ED Notes (Signed)
Pt is in stable condition upon d/c and ambulates from ED. 

## 2015-07-17 NOTE — ED Notes (Signed)
Pt did well with fluid challenge and had no episodes of emesis or diarrhea since being in ED.

## 2015-07-19 ENCOUNTER — Observation Stay (HOSPITAL_COMMUNITY)
Admission: EM | Admit: 2015-07-19 | Discharge: 2015-07-19 | Discharge status: Against Medical Advice | Payer: Self-pay | Attending: Family Medicine | Admitting: Family Medicine

## 2015-07-19 ENCOUNTER — Encounter (HOSPITAL_COMMUNITY): Payer: Self-pay | Admitting: *Deleted

## 2015-07-19 ENCOUNTER — Other Ambulatory Visit: Payer: Self-pay

## 2015-07-19 ENCOUNTER — Emergency Department (HOSPITAL_COMMUNITY): Payer: Medicaid - Out of State

## 2015-07-19 ENCOUNTER — Encounter (HOSPITAL_COMMUNITY): Payer: Self-pay

## 2015-07-19 DIAGNOSIS — G4733 Obstructive sleep apnea (adult) (pediatric): Secondary | ICD-10-CM | POA: Insufficient documentation

## 2015-07-19 DIAGNOSIS — R748 Abnormal levels of other serum enzymes: Secondary | ICD-10-CM | POA: Insufficient documentation

## 2015-07-19 DIAGNOSIS — Z8249 Family history of ischemic heart disease and other diseases of the circulatory system: Secondary | ICD-10-CM | POA: Insufficient documentation

## 2015-07-19 DIAGNOSIS — Z6841 Body Mass Index (BMI) 40.0 and over, adult: Secondary | ICD-10-CM | POA: Insufficient documentation

## 2015-07-19 DIAGNOSIS — I2510 Atherosclerotic heart disease of native coronary artery without angina pectoris: Secondary | ICD-10-CM | POA: Insufficient documentation

## 2015-07-19 DIAGNOSIS — F17210 Nicotine dependence, cigarettes, uncomplicated: Secondary | ICD-10-CM | POA: Insufficient documentation

## 2015-07-19 DIAGNOSIS — I10 Essential (primary) hypertension: Secondary | ICD-10-CM | POA: Insufficient documentation

## 2015-07-19 DIAGNOSIS — Z7982 Long term (current) use of aspirin: Secondary | ICD-10-CM | POA: Insufficient documentation

## 2015-07-19 DIAGNOSIS — E669 Obesity, unspecified: Secondary | ICD-10-CM | POA: Insufficient documentation

## 2015-07-19 DIAGNOSIS — R079 Chest pain, unspecified: Principal | ICD-10-CM | POA: Insufficient documentation

## 2015-07-19 DIAGNOSIS — Z7951 Long term (current) use of inhaled steroids: Secondary | ICD-10-CM | POA: Insufficient documentation

## 2015-07-19 DIAGNOSIS — R7989 Other specified abnormal findings of blood chemistry: Secondary | ICD-10-CM

## 2015-07-19 DIAGNOSIS — K219 Gastro-esophageal reflux disease without esophagitis: Secondary | ICD-10-CM | POA: Insufficient documentation

## 2015-07-19 DIAGNOSIS — M1990 Unspecified osteoarthritis, unspecified site: Secondary | ICD-10-CM | POA: Insufficient documentation

## 2015-07-19 DIAGNOSIS — Z5321 Procedure and treatment not carried out due to patient leaving prior to being seen by health care provider: Secondary | ICD-10-CM | POA: Insufficient documentation

## 2015-07-19 DIAGNOSIS — J45909 Unspecified asthma, uncomplicated: Secondary | ICD-10-CM | POA: Insufficient documentation

## 2015-07-19 DIAGNOSIS — F1410 Cocaine abuse, uncomplicated: Secondary | ICD-10-CM | POA: Insufficient documentation

## 2015-07-19 DIAGNOSIS — N189 Chronic kidney disease, unspecified: Secondary | ICD-10-CM | POA: Insufficient documentation

## 2015-07-19 DIAGNOSIS — Z79891 Long term (current) use of opiate analgesic: Secondary | ICD-10-CM | POA: Insufficient documentation

## 2015-07-19 DIAGNOSIS — E785 Hyperlipidemia, unspecified: Secondary | ICD-10-CM | POA: Insufficient documentation

## 2015-07-19 DIAGNOSIS — Z79899 Other long term (current) drug therapy: Secondary | ICD-10-CM | POA: Insufficient documentation

## 2015-07-19 DIAGNOSIS — F329 Major depressive disorder, single episode, unspecified: Secondary | ICD-10-CM | POA: Insufficient documentation

## 2015-07-19 DIAGNOSIS — R739 Hyperglycemia, unspecified: Secondary | ICD-10-CM | POA: Diagnosis present

## 2015-07-19 DIAGNOSIS — Z792 Long term (current) use of antibiotics: Secondary | ICD-10-CM | POA: Insufficient documentation

## 2015-07-19 DIAGNOSIS — I129 Hypertensive chronic kidney disease with stage 1 through stage 4 chronic kidney disease, or unspecified chronic kidney disease: Secondary | ICD-10-CM | POA: Insufficient documentation

## 2015-07-19 DIAGNOSIS — F419 Anxiety disorder, unspecified: Secondary | ICD-10-CM | POA: Insufficient documentation

## 2015-07-19 DIAGNOSIS — Z7984 Long term (current) use of oral hypoglycemic drugs: Secondary | ICD-10-CM | POA: Insufficient documentation

## 2015-07-19 DIAGNOSIS — G8929 Other chronic pain: Secondary | ICD-10-CM | POA: Insufficient documentation

## 2015-07-19 DIAGNOSIS — M25569 Pain in unspecified knee: Secondary | ICD-10-CM | POA: Insufficient documentation

## 2015-07-19 DIAGNOSIS — N289 Disorder of kidney and ureter, unspecified: Secondary | ICD-10-CM | POA: Insufficient documentation

## 2015-07-19 DIAGNOSIS — G43509 Persistent migraine aura without cerebral infarction, not intractable, without status migrainosus: Secondary | ICD-10-CM | POA: Insufficient documentation

## 2015-07-19 HISTORY — DX: Obstructive sleep apnea (adult) (pediatric): G47.33

## 2015-07-19 HISTORY — DX: Hyperlipidemia, unspecified: E78.5

## 2015-07-19 HISTORY — DX: Poisoning by unspecified drugs, medicaments and biological substances, intentional self-harm, initial encounter: T50.902A

## 2015-07-19 HISTORY — DX: Cocaine abuse, uncomplicated: F14.10

## 2015-07-19 HISTORY — DX: Hyperglycemia, unspecified: R73.9

## 2015-07-19 LAB — CBC
HCT: 45.9 % (ref 39.0–52.0)
HCT: 45.7 % (ref 39.0–52.0)
HEMOGLOBIN: 14.9 g/dL (ref 13.0–17.0)
HEMOGLOBIN: 14.8 g/dL (ref 13.0–17.0)
MCH: 28.1 pg (ref 26.0–34.0)
MCH: 27.9 pg (ref 26.0–34.0)
MCHC: 32.5 g/dL (ref 30.0–36.0)
MCHC: 32.4 g/dL (ref 30.0–36.0)
MCV: 86.4 fL (ref 78.0–100.0)
MCV: 86.1 fL (ref 78.0–100.0)
PLATELETS: 309 10*3/uL (ref 150–400)
Platelets: 306 10*3/uL (ref 150–400)
RBC: 5.31 MIL/uL (ref 4.22–5.81)
RBC: 5.31 MIL/uL (ref 4.22–5.81)
RDW: 14.3 % (ref 11.5–15.5)
RDW: 14.2 % (ref 11.5–15.5)
WBC: 8.6 10*3/uL (ref 4.0–10.5)
WBC: 10.5 10*3/uL (ref 4.0–10.5)

## 2015-07-19 LAB — BASIC METABOLIC PANEL
ANION GAP: 8 (ref 5–15)
ANION GAP: 10 (ref 5–15)
BUN: 13 mg/dL (ref 6–20)
BUN: 13 mg/dL (ref 6–20)
CALCIUM: 8.9 mg/dL (ref 8.9–10.3)
CALCIUM: 9.5 mg/dL (ref 8.9–10.3)
CHLORIDE: 104 mmol/L (ref 101–111)
CO2: 25 mmol/L (ref 22–32)
CO2: 27 mmol/L (ref 22–32)
CREATININE: 1.07 mg/dL (ref 0.61–1.24)
Chloride: 108 mmol/L (ref 101–111)
Creatinine, Ser: 1.01 mg/dL (ref 0.61–1.24)
GFR calc non Af Amer: >60 mL/min (ref >60)
Glucose, Bld: 138 mg/dL — ABNORMAL HIGH (ref 65–99)
Glucose, Bld: 113 mg/dL — ABNORMAL HIGH (ref 65–99)
POTASSIUM: 3.8 mmol/L (ref 3.5–5.1)
Potassium: 4.1 mmol/L (ref 3.5–5.1)
Sodium: 141 mmol/L (ref 135–145)
Sodium: 141 mmol/L (ref 135–145)

## 2015-07-19 LAB — I-STAT TROPONIN, ED
TROPONIN I, POC: 0 ng/mL (ref 0.00–0.08)
TROPONIN I, POC: 0 ng/mL (ref 0.00–0.08)
Troponin i, poc: 0.25 ng/mL (ref 0.00–0.08)

## 2015-07-19 MED ORDER — NITROGLYCERIN 0.4 MG SL SUBL
0.4000 mg | SUBLINGUAL_TABLET | SUBLINGUAL | Status: DC | PRN
  Administered 2015-07-19: 0.4 mg via SUBLINGUAL
  Filled 2015-07-19: qty 1

## 2015-07-19 MED ORDER — HYDROCODONE-ACETAMINOPHEN 5-325 MG PO TABS
1.0000 | ORAL_TABLET | Freq: Once | ORAL | Status: AC
  Administered 2015-07-19: 1 via ORAL
  Filled 2015-07-19: qty 1

## 2015-07-19 MED ORDER — HEPARIN (PORCINE) IN NACL 100-0.45 UNIT/ML-% IJ SOLN
1600.0000 [IU]/h | INTRAMUSCULAR | Status: DC
  Administered 2015-07-19: 1600 [IU]/h via INTRAVENOUS
  Filled 2015-07-19 (×2): qty 250

## 2015-07-19 MED ORDER — HEPARIN BOLUS VIA INFUSION
4000.0000 [IU] | Freq: Once | INTRAVENOUS | Status: AC
  Administered 2015-07-19: 4000 [IU] via INTRAVENOUS
  Filled 2015-07-19: qty 4000

## 2015-07-19 MED ORDER — LORAZEPAM 2 MG/ML IJ SOLN: 1.0000 mg | Freq: Once | INTRAMUSCULAR | Status: DC

## 2015-07-19 MED ORDER — MORPHINE SULFATE (PF) 4 MG/ML IV SOLN: 4.0000 mg | Freq: Once | INTRAVENOUS | Status: DC

## 2015-07-19 NOTE — H&P (Signed)
Family Medicine Teaching [facility] Admission History and Physical Service Pager: [phone]  Patient name: Richard Anderson Medical record number: [id number] Date of birth: 1971-03-29 Age: 45 y.o. Gender: male  Primary Care Provider: Pcp Not In System Consultants: Cardiology  Code Status: Full (per discussion on admission)  Chief Complaint: chest pain  Assessment and Plan: Richard Anderson is a 45 y.o. male presenting with chest pain. PMH is significant for HTN, obesity, asthma, arthritis, depression, CAD, GERD.   Chest Pain: Leading differentials include cocaine induced angina vs NSTEMI vs anxiety/panic attack. Patient has no known cardiac history other than hypertension. Has not been on any medications. Pain relieved with Nitro. CBC and BMP WNL. Troponin initially 0, now 0.25. EKG with sinus rhythm, no ST elevation seen.  - Place in Observation, [place] attending - Cardiac Monitoring - AM EKG - Trend Troponins - Check BNP, TSH, Lipid Panel - Echocardiogram - Cardiology consulted, appreciate recs  Depression and anxiety: Patient acutely depressed in the setting of recent death of his sister 2 days ago. Has not been on any antidepressant medications but states he has a history of depression. Used to take Xanax as well. No active suicidal or homicidal ideations at this time.  - Will continue to monitor mood - Consult chaplain services - Would benefit from outpatient psychiatry   FEN/GI: SLIV, heart healthy carb modified diet Prophylaxis: Lovenox  Disposition: Admit to inpatient FPTS, attending Dr. Lum Babe  History of Present Illness:  Richard Anderson is a 45 y.o. male presenting with chest pain. Patient was woken up from sleep this morning with substernal chest pain, diaphoresis, nausea with emesis. Two days ago, he started using cocaine to cope with his emotions after hearing that his sister had died from congestive heart failure. He reports using 6 grams in the last two days.  His last use was at 4:00AM and he laced his marijuana with cocaine. Last night, he also drank a 5th of rum He has also snorted his cocaine. No IV use. He has been clean from cocaine for about 20 years. No numbness, tingling, weakness, diarrhea or constipation. He was transported to the ED via private vehicle. He received nitroglycerin which did help.  Review Of Systems: Per HPI with the following additions: see HPI Otherwise the remainder of the systems were negative.  Patient Active Problem List   Diagnosis Date Noted  . Chest pain 07/19/2015  . Hypertension   . Hyperlipidemia   . Hyperglycemia   . Drug abuse, cocaine type   . Mood disorder (HCC) 12/03/2014  . Suicidal ideations   . Migraine aura, persistent     Past Medical History: Past Medical History  Diagnosis Date  . Hypertension   . Obesity   . Arthritis   . Renal disorder   . Depression   . Migraine aura, persistent     since childhood  . Head ache   . Coronary artery disease   . GERD (gastroesophageal reflux disease)   . Asthma   . Suicide attempt by drug ingestion [facility]) Oct 17, 2010    After his father died    Past Surgical History: Past Surgical History  Procedure Laterality Date  . Cardiac catheterization      x2  . Appendectomy      Social History: Social History  Substance Use Topics  . Smoking status: Current Every Day Smoker -- 0.00 packs/day    Types: Cigarettes  . Smokeless tobacco: None  . Alcohol Use: No   Additional social  history: Used cocaine 20 years ago, recently picked up cocaine again 2 days ago.  Please also refer to relevant sections of EMR.  Family History: Family History  Problem Relation Age of Onset  . Hypertension Mother   . Hyperlipidemia Mother   . Hypertension Brother     Allergies and Medications: Allergies  Allergen Reactions  . Bee Venom Anaphylaxis  . Hornet Venom Anaphylaxis  . Aspartame And Phenylalanine     Migraine  . Doxycycline Other (See Comments)     Pancreatis   . Phenylalanine     Migraine  . Trazodone And Nefazodone Other (See Comments)    priapism   No current facility-administered medications on file prior to encounter.   Current Outpatient Prescriptions on File Prior to Encounter  Medication Sig Dispense Refill  . albuterol (PROVENTIL HFA;VENTOLIN HFA) 108 (90 BASE) MCG/ACT inhaler Inhale 2 puffs into the lungs every 4 (four) hours as needed for wheezing or shortness of breath. 1 Inhaler 0  . ALPRAZolam (XANAX) 1 MG tablet Take 1 mg by mouth 3 (three) times daily as needed for anxiety.    Marland Kitchen aspirin 81 MG chewable tablet Chew 81 mg by mouth daily.    . fluticasone (FLONASE) 50 MCG/ACT nasal spray Place 2 sprays into both nostrils daily as needed for allergies.     Marland Kitchen gabapentin (NEURONTIN) 800 MG tablet Take 800 mg by mouth 3 (three) times daily.    . ondansetron (ZOFRAN) 4 MG tablet Take 1 tablet (4 mg total) by mouth every 6 (six) hours. 12 tablet 0  . oxyCODONE-acetaminophen (PERCOCET/ROXICET) 5-325 MG tablet Take 1-2 tablets by mouth every 6 (six) hours as needed for severe pain. 15 tablet 0  . pravastatin (PRAVACHOL) 40 MG tablet Take 1 tablet (40 mg total) by mouth daily. 30 tablet 0  . promethazine (PHENERGAN) 25 MG suppository Place 1 suppository (25 mg total) rectally every 6 (six) hours as needed for nausea or vomiting. 12 each 0  . ampicillin (PRINCIPEN) 500 MG capsule Take 500 mg by mouth 2 (two) times daily.    Marland Kitchen levofloxacin (LEVAQUIN) 750 MG tablet Take 1 tablet (750 mg total) by mouth daily. 4 tablet 0  . metFORMIN (GLUCOPHAGE) 500 MG tablet Take 500 mg by mouth daily with breakfast.    . [DISCONTINUED] metoprolol tartrate (LOPRESSOR) 25 MG tablet Take 1 tablet (25 mg total) by mouth once. (Patient taking differently: Take 25 mg by mouth 2 (two) times daily. ) 30 tablet 0    Objective: BP 129/51 mmHg  Pulse 66  Temp(Src) 98.2 F (36.8 C) (Oral)  Resp 18  Ht 6' (1.829 m)  Wt 320 lb (145.151 kg)  BMI 43.39 kg/m2   SpO2 98% Exam: General: NAD, sitting up in bed, obese Eyes: PERRLA, non icteric sclera ENTM: moist mucous membranes Neck: supple Cardiovascular: RRR, normal s1 and s2, no murmurs, distant heart sounds likely secondary to body habitus Respiratory: normal work of breathing, CTAB Abdomen: soft, obese abdomen, normal bowel sounds, non tender, no masses  MSK: normal range of motion Skin: diaphoretic, no rashes Neuro: alert and oriented x 3, sensation intact throughout, 5/5 strength in bilateral upper and lower extremities Psych: anxious and depressed mood. Denies suicidal or homicidal ideations  Labs and Imaging: CBC BMET   Recent Labs Lab 07/19/15 1134  WBC 8.6  HGB 14.9  HCT 45.9  PLT 309    Recent Labs Lab 07/19/15 1134  NA 141  K 4.1  CL 108  CO2 25  BUN 13  CREATININE 1.07  GLUCOSE 138*  CALCIUM 8.9     Dg Chest 2 View  07/19/2015  CLINICAL DATA:  Shortness of breath with exertion. EXAM: CHEST  2 VIEW COMPARISON:  None. FINDINGS: The heart size and mediastinal contours are within normal limits. Both lungs are clear. The visualized skeletal structures are unremarkable. IMPRESSION: No active cardiopulmonary disease. Electronically Signed   By: Elige Ko   On: 07/19/2015 12:25    Beaulah Dinning, MD 07/19/2015, 4:15 PM PGY-1, [facility] Family Medicine FPTS Intern pager: [phone], text pages welcome  I have seen and examined the patient. I have read and agree with the above note. My changes are noted in blue.  Jacquelin Hawking, MD PGY-3, [facility] Family Medicine 07/19/2015, 10:35 PM

## 2015-07-19 NOTE — ED Notes (Signed)
Pt advised to wait and speak with family medicine team. Pt reports he cannot wait and is witnessed leaving the department in NAD a/o x4. VS stable

## 2015-07-19 NOTE — ED Notes (Signed)
Pt returned from xray

## 2015-07-19 NOTE — ED Notes (Signed)
Family medicine MD made aware of patient leaving AMA

## 2015-07-19 NOTE — ED Provider Notes (Signed)
CSN: [id number]     Arrival date & time 07/19/15  1116 History   First MD Initiated Contact with Patient 07/19/15 1140     Chief Complaint  Patient presents with  . Chest Pain     (Consider location/radiation/quality/duration/timing/severity/associated sxs/prior Treatment) HPI  87 y m w pmh htn, ckd, possible cad though clean cath in 2012, coming in with chest pain. Pt has been using cocaine, last use early this morning.  Later in the morning developed SSCP with diaphoresis, the pain is pressure like.  Pain continued and patient took 3  asa.  He is continued to have pain.    Past Medical History  Diagnosis Date  . Hypertension   . Obesity   . Arthritis   . Renal disorder   . Depression   . Migraine aura, persistent     since childhood  . Head ache   . Coronary artery disease   . GERD (gastroesophageal reflux disease)   . Asthma    Past Surgical History  Procedure Laterality Date  . Cardiac catheterization      x2, last one in 2012, luminal irregularities in multiple vessels, EF 45%  . Appendectomy     Family History  Problem Relation Age of Onset  . Hypertension Mother   . Hyperlipidemia Mother   . Hypertension Brother   . Leukemia Brother   . Congestive Heart Failure Sister    Social History  Substance Use Topics  . Smoking status: Current Every Day Smoker -- 0.00 packs/day    Types: Cigarettes  . Smokeless tobacco: None  . Alcohol Use: No    Review of Systems  Constitutional: Positive for diaphoresis. Negative for fever and chills.  Eyes: Negative for redness.  Respiratory: Negative for cough and shortness of breath.   Cardiovascular: Positive for chest pain.  Gastrointestinal: Negative for nausea, vomiting, abdominal pain and diarrhea.  Genitourinary: Negative for dysuria.  Skin: Negative for rash.  Neurological: Negative for headaches.  Psychiatric/Behavioral: Positive for dysphoric mood. Confusion: sadness.  All other systems reviewed and are  negative.     Allergies  Bee venom; Hornet venom; Aspartame and phenylalanine; Doxycycline; Phenylalanine; and Trazodone and nefazodone  Home Medications   Prior to Admission medications   Medication Sig Start Date End Date Taking? Authorizing Provider  albuterol (PROVENTIL HFA;VENTOLIN HFA) 108 (90 BASE) MCG/ACT inhaler Inhale 2 puffs into the lungs every 4 (four) hours as needed for wheezing or shortness of breath. 05/16/15  Yes Shon Baton, MD  ALPRAZolam [name]) 1 MG tablet Take 1 mg by mouth 3 (three) times daily as needed for anxiety.   Yes Historical Provider, MD  aspirin 81 MG chewable tablet Chew 81 mg by mouth daily.   Yes Historical Provider, MD  fluticasone (FLONASE) 50 MCG/ACT nasal spray Place 2 sprays into both nostrils daily as needed for allergies.    Yes Historical Provider, MD  gabapentin (NEURONTIN) 800 MG tablet Take 800 mg by mouth 3 (three) times daily.   Yes Historical Provider, MD  lisinopril (PRINIVIL,ZESTRIL) 20 MG tablet Take 20 mg by mouth daily.   Yes Historical Provider, MD  ondansetron (ZOFRAN) 4 MG tablet Take 1 tablet (4 mg total) by mouth every 6 (six) hours. 07/17/15  Yes Jerelyn Scott, MD  oxyCODONE-acetaminophen (PERCOCET/ROXICET) 5-325 MG tablet Take 1-2 tablets by mouth every 6 (six) hours as needed for severe pain. 05/16/15  Yes Shon Baton, MD  pravastatin (PRAVACHOL) 40 MG tablet Take 1 tablet (40 mg total) by  mouth daily. 10/20/14  Yes [name], PA-C  promethazine (PHENERGAN) 25 MG suppository Place 1 suppository (25 mg total) rectally every 6 (six) hours as needed for nausea or vomiting. 07/17/15  Yes Jerelyn Scott, MD  ampicillin (PRINCIPEN) 500 MG capsule Take 500 mg by mouth 2 (two) times daily.    Historical Provider, MD  levofloxacin (LEVAQUIN) 750 MG tablet Take 1 tablet (750 mg total) by mouth daily. 05/25/15   Tomasita Crumble, MD  metFORMIN (GLUCOPHAGE) 500 MG tablet Take 500 mg by mouth daily with breakfast.    Historical Provider,  MD   BP 129/51 mmHg  Pulse 66  Temp(Src) 98.2 F (36.8 C) (Oral)  Resp 18  Ht 6' (1.829 m)  Wt 145.151 kg  BMI 43.39 kg/m2  SpO2 98% Physical Exam  Constitutional: He is oriented to person, place, and time. No distress.  HENT:  Head: Normocephalic and atraumatic.  Eyes: EOM are normal. Pupils are equal, round, and reactive to light.  Neck: Normal range of motion. Neck supple.  Cardiovascular: Normal rate.   Pulmonary/Chest: Effort normal. No respiratory distress.  Abdominal: Soft. There is no tenderness.  Musculoskeletal: Normal range of motion.  Neurological: He is alert and oriented to person, place, and time.  Skin: No rash noted. He is not diaphoretic.  Psychiatric: His speech is normal and behavior is normal. Judgment normal. Cognition and memory are normal. He exhibits a depressed mood. He expresses no homicidal and no suicidal ideation. He expresses no suicidal plans and no homicidal plans.    ED Course  Procedures (including critical care time) Labs Review Labs Reviewed  BASIC METABOLIC PANEL - Abnormal; Notable for the following:    Glucose, Bld 138 (*)    All other components within normal limits  I-STAT TROPOININ, ED - Abnormal; Notable for the following:    Troponin i, poc 0.25 (*)    All other components within normal limits  CBC  URINE RAPID DRUG SCREEN, HOSP PERFORMED  I-STAT TROPOININ, ED  Rosezena Sensor, ED    Imaging Review Dg Chest 2 View  07/19/2015  CLINICAL DATA:  Shortness of breath with exertion. EXAM: CHEST  2 VIEW COMPARISON:  None. FINDINGS: The heart size and mediastinal contours are within normal limits. Both lungs are clear. The visualized skeletal structures are unremarkable. IMPRESSION: No active cardiopulmonary disease. Electronically Signed   By: Elige Ko   On: 07/19/2015 12:25   I have personally reviewed and evaluated these images and lab results as part of my medical decision-making.   EKG Interpretation   Date/Time:   Thursday July 19 2015 11:20:41 EST Ventricular Rate:  95 PR Interval:  142 QRS Duration: 80 QT Interval:  356 QTC Calculation: 447 R Axis:   15 Text Interpretation:  Normal sinus rhythm with sinus arrhythmia Normal ECG  Confirmed by Lincoln Brigham [phone]) on 07/19/2015 11:34:17 AM      MDM   Final diagnoses:  Chest pain, unspecified chest pain type     76 y m w pmh htn, ckd, possible cad though clean cath in 2012, coming in with chest pain.  Patient has been sad recently 2/2 his sisters death and has been using cocaine.  The chest pain is described like cardiac chest pain.  He also says he has been very sad since his sisters passing but denies any thoughts of harming or killing himself.  He doesn't seem intoxicated at this time. Exam as above.  Concern for acs.  ekg w/o ischemic changes.  Cbc/bmp  wnl.  First trop neg.  cxr wnl.  Will plan to delta trop.  Positive delta, pt took asa at home, he is almost pain free now after nitro and norco.  Will start hep gtt, cards consult.  Doubt dissection given description of pain and age.  Doubt pe given no leg swelling, tachycardia, or hypoxia, no previous dvt/pe.   Will admit to trend trops.  Arrangements made for admission    Silas Flood, MD 07/19/15 1640  Silas Flood, MD 07/19/15 1821  Tilden Fossa, MD 07/20/15 [phone]

## 2015-07-19 NOTE — Consult Note (Signed)
CARDIOLOGY CONSULT NOTE   Patient ID: Richard Anderson MRN: [id number] DOB/AGE: 44-Dec-1972 44 y.o.  Admit date: 07/19/2015  Primary Physician   Pcp Not In System Primary Cardiologist   Dr Algie Coffer cathed in 10/31/2010, has seen no-one in the office Reason for Consultation   Chest pain  WUJ:WJXBJYN L Mccon[place] is a 45 y.o. year old male with a history of polysubstance abuse, HTN, HLD, GERD, chest pain with minimal CAD at cath, last in 10/31/10. Last time he relapsed was 12-01-14 after his brother died.   Currently unemployed and living with S.O. He has been working to get healthier, peak weight was > 500 lbs, now about 325 lbs. Climbs stairs several times daily as they live on the second floor. Does not have any chest pain with this exertion, will get DOE more easily in hot weather. Has some daytime edema, does not wake up with it. No PND, chronic orthopnea, he has had a sleep study and was diagnosed with sleep apnea, but was not able to afford the equipment.   He has been doing pot, ETOH and cocaine since he found out his sister died 2 days ago. He was doing cocaine, last at 4:30 am today when he did a cocaine-laced blunt. He went to bed after that. He woke about 10:30 with squeezing chest pain, sweaty, shaking, head was pounding. Chest pain was a 7/10. He took baby ASA x 3, vomited x 2, no change in symptoms. Felt very hot.   Came to the ER, got SL NTG x 1 and Vicodin 5 mg, chest pain eased, now at a 4-5/10. Continuous since 10:30 am. Generally feels much better.   First episode of chest pain he remembers since 10-31-2010, when he had a cath. 02/2015 ER visit was for abdominal symptoms, pt states he really did not have chest pain.  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  Past Medical History  Diagnosis Date  . Hypertension   . Obesity   . Arthritis   . Renal disorder   . Depression   .  Migraine aura, persistent     since childhood  . Head ache   . Coronary artery disease   . GERD (gastroesophageal reflux disease)   . Asthma   . Suicide attempt by drug ingestion [facility]) Oct 31, 2010    After his father died  . Hyperlipidemia   . Hyperglycemia   . OSA (obstructive sleep apnea)   . Drug abuse, cocaine type      Past Surgical History  Procedure Laterality Date  . Cardiac catheterization      x2, last one in 2010/10/31, luminal irregularities in multiple vessels, EF 45%  . Appendectomy      Allergies  Allergen Reactions  . Bee Venom Anaphylaxis  . Hornet Venom Anaphylaxis  . Aspartame And Phenylalanine     Migraine  . Doxycycline Other (See Comments)    Pancreatis   . Phenylalanine     Migraine  . Trazodone And Nefazodone Other (See Comments)    priapism    I have reviewed the patient's current medications   . heparin 1,600 Units/hr (07/19/15 1654)   nitroGLYCERIN  Prior to Admission medications   Medication Sig Start Date End Date Taking? Authorizing Provider  albuterol (PROVENTIL HFA;VENTOLIN HFA) 108 (90 BASE) MCG/ACT inhaler Inhale 2 puffs into the lungs every 4 (four) hours as needed for  wheezing or shortness of breath. 05/16/15  Yes Shon Baton, MD  ALPRAZolam [name]) 1 MG tablet Take 1 mg by mouth 3 (three) times daily as needed for anxiety.   Yes Historical Provider, MD  aspirin 81 MG chewable tablet Chew 81 mg by mouth daily.   Yes Historical Provider, MD  fluticasone (FLONASE) 50 MCG/ACT nasal spray Place 2 sprays into both nostrils daily as needed for allergies.    Yes Historical Provider, MD  gabapentin (NEURONTIN) 800 MG tablet Take 800 mg by mouth 3 (three) times daily.   Yes Historical Provider, MD  lisinopril (PRINIVIL,ZESTRIL) 20 MG tablet Take 20 mg by mouth daily.   Yes Historical Provider, MD  ondansetron (ZOFRAN) 4 MG tablet Take 1 tablet (4 mg total) by mouth every 6 (six) hours. 07/17/15  Yes Jerelyn Scott, MD  oxyCODONE-acetaminophen  (PERCOCET/ROXICET) 5-325 MG tablet Take 1-2 tablets by mouth every 6 (six) hours as needed for severe pain. 05/16/15  Yes Shon Baton, MD  pravastatin (PRAVACHOL) 40 MG tablet Take 1 tablet (40 mg total) by mouth daily. 10/20/14  Yes [name], PA-C  promethazine (PHENERGAN) 25 MG suppository Place 1 suppository (25 mg total) rectally every 6 (six) hours as needed for nausea or vomiting. 07/17/15  Yes Jerelyn Scott, MD  ampicillin (PRINCIPEN) 500 MG capsule Take 500 mg by mouth 2 (two) times daily.    Historical Provider, MD  levofloxacin (LEVAQUIN) 750 MG tablet Take 1 tablet (750 mg total) by mouth daily. 05/25/15   Tomasita Crumble, MD  metFORMIN (GLUCOPHAGE) 500 MG tablet Take 500 mg by mouth daily with breakfast.    Historical Provider, MD     Social History   Social History  . Marital Status: Single    Spouse Name: N/A  . Number of Children: N/A  . Years of Education: N/A   Occupational History  . [employer]    Social History Main Topics  . Smoking status: Current Every Day Smoker -- 0.00 packs/day    Types: Cigarettes  . Smokeless tobacco: Not on file  . Alcohol Use: No  . Drug Use: Yes    Special: Cocaine, Marijuana  . Sexual Activity: Yes     Comment: Pt states he is in a monogomous homosexual relationship.    Other Topics Concern  . Not on file   Social History Narrative   Lives with S.O., Fayrene Fearing, in [place].    Family Status  Relation Status Death Age  . Mother Deceased 21    MI  . Father Deceased 74    MI  . Sister Deceased 29    CHF  . Brother Deceased 62    Leukemia   Family History  Problem Relation Age of Onset  . Hypertension Mother   . Hyperlipidemia Mother   . Hypertension Brother   . Leukemia Brother   . Congestive Heart Failure Sister      ROS:  Full 14 point review of systems complete and found to be negative unless listed above.  Physical Exam: Blood pressure 135/80, pulse 61, temperature 98.2 F (36.8 C),  temperature source Oral, resp. rate 16, height 6' (1.829 m), weight 320 lb (145.151 kg), SpO2 98 %.  General: Well developed, well nourished, male in no acute distress Head: Eyes PERRLA, No xanthomas.   Normocephalic and atraumatic, oropharynx without edema or exudate. Dentition: poor Lungs: clear bilaterally Heart: HRRR S1 S2, no rub/gallop, no murmur. pulses are 2+ all 4 extrem.  Neck: No carotid  bruits. No lymphadenopathy.  JVD difficult to assess 2nd body habitus. Abdomen: Bowel sounds present, abdomen soft and non-tender without masses or hernias noted. Msk:  No spine or cva tenderness. No weakness, no joint deformities or effusions. Extremities: No clubbing or cyanosis. No edema.  Neuro: Alert and oriented X 3. No focal deficits noted. Psych:  Good affect, responds appropriately Skin: No rashes or lesions noted.  Labs:   Lab Results  Component Value Date   WBC 8.6 07/19/2015   HGB 14.9 07/19/2015   HCT 45.9 07/19/2015   MCV 86.4 07/19/2015   PLT 309 07/19/2015     Recent Labs Lab 07/17/15 0901 07/19/15 1134  NA 141 141  K 3.9 4.1  CL 107 108  CO2 24 25  BUN 17 13  CREATININE 1.08 1.07  CALCIUM 8.7* 8.9  PROT 6.6  --   BILITOT 0.5  --   ALKPHOS 55  --   ALT 28  --   AST 23  --   GLUCOSE 151* 138*  ALBUMIN 3.4*  --     Recent Labs  07/19/15 1152 07/19/15 1532  TROPIPOC 0.00 0.25*   ECG:  07/19/2015 SR, no acute ischemic changes  Radiology:  Dg Chest 2 View 07/19/2015  CLINICAL DATA:  Shortness of breath with exertion. EXAM: CHEST  2 VIEW COMPARISON:  None. FINDINGS: The heart size and mediastinal contours are within normal limits. Both lungs are clear. The visualized skeletal structures are unremarkable. IMPRESSION: No active cardiopulmonary disease. Electronically Signed   By: Elige Ko   On: 07/19/2015 12:25    ASSESSMENT AND PLAN:   The patient was seen today by Dr Excell Seltzer, the patient evaluated and the data reviewed.  Active Problems:   Chest pain -  mild troponin elevation in the setting of cocaine, ETOH and THC use.  - also with emotional stress. - with elevation in troponin, agree with heparin. - will ck echo - CRFs have not been controlled, but he is active on a daily basis without ischemic symptoms - if EF normal, no WMA, and troponin levels remain flat, no further cardiac workup  Otherwise, per IM; consider anxiety rx   Hypertension   Hyperlipidemia   Hyperglycemia   Drug abuse, cocaine type  SignedLeanna Battles 07/19/2015 5:38 PM Beeper [phone]  Co-Sign MD  Patient seen, examined. Available data reviewed. Agree with findings, assessment, and plan as outlined by Theodore Demark, PA-C. Exam reveals obese male in NAD, but anxious. Lungs CTA, heart RRR without murmur, abdomen soft, obese, NT; extremities with trace edema. EKG is normal, trop mildly elevated. Pt on cocaine/Etoh binge this am after unfortunate events with death of his sister who was his last remaining family member. I recommended overnight observation on heparin and cycle cardiac enzymes. Initially he agrees to this but I was later informed by nursing that he plans on leaving AMA. Will follow-up tomorrow am if he is still here.  Tonny Bollman, M.D. 07/19/2015 5:51 PM

## 2015-07-19 NOTE — ED Notes (Signed)
Patient to ED for eval of left sided chest pain radiating into left shoulder with sob, diaphoresis, and vomiting. Patient was seen today and was to be admitted by cards but left AMA. Patient states he got nervous. Patient left and had increased CP and vomited.

## 2015-07-19 NOTE — Consult Note (Signed)
ANTICOAGULATION CONSULT NOTE - Initial Consult  Pharmacy Consult for heparin Indication: chest pain/ACS  Patient Measurements: Height: 6' (182.9 cm) Weight: (!) 320 lb (145.151 kg) IBW/kg (Calculated) : 77.6 Heparin Dosing Weight: 111.4 kg  Vital Signs: Temp: 98.2 F (36.8 C) (02/09 1124) Temp Source: Oral (02/09 1124) BP: 129/51 mmHg (02/09 1330) Pulse Rate: 66 (02/09 1330)  Labs:  Recent Labs  07/17/15 0901 07/19/15 1134  HGB 14.5 14.9  HCT 46.2 45.9  PLT 301 309  CREATININE 1.08 1.07    Estimated Creatinine Clearance: 131.7 mL/min (by C-G formula based on Cr of 1.07).    Assessment: 45 yo male w/ elevated troponin to 0.25 who presents w/ chest pain. Pt reports using cocaine early this morning and later developed CP and diaphoresis.   Goal of Therapy:  Heparin level 0.3-0.7 units/ml Monitor platelets by anticoagulation protocol: Yes   Plan:  Give 4000 units bolus x 1 Start heparin infusion at 1600 units/hr Check anti-Xa level in 6 hours and daily while on heparin Continue to monitor H&H and platelets  Greggory Stallion, PharmD Clinical Pharmacy Resident Pager # [phone] 07/19/2015 6:09 PM

## 2015-07-19 NOTE — ED Notes (Signed)
Pt transporting to xray. NAD.  

## 2015-07-19 NOTE — ED Notes (Signed)
Patient reports that he awoke 30 minutes ago with SSCP, diaphoresis and vomiting, hyperventilating on arrival

## 2015-07-19 NOTE — ED Notes (Signed)
Pt requesting to leave AMA at this time. Cardiology made aware. Internal medicine paged. VSS. Pt understands risks of leaving against medical advice.

## 2015-07-20 ENCOUNTER — Observation Stay (HOSPITAL_COMMUNITY)
Admission: EM | Admit: 2015-07-20 | Discharge: 2015-07-20 | Disposition: A | Discharge status: Home / Self Care | Payer: Medicaid - Out of State | Attending: Family Medicine | Admitting: Family Medicine

## 2015-07-20 DIAGNOSIS — I10 Essential (primary) hypertension: Secondary | ICD-10-CM | POA: Diagnosis present

## 2015-07-20 DIAGNOSIS — R778 Other specified abnormalities of plasma proteins: Secondary | ICD-10-CM | POA: Diagnosis present

## 2015-07-20 DIAGNOSIS — R7989 Other specified abnormal findings of blood chemistry: Secondary | ICD-10-CM | POA: Diagnosis present

## 2015-07-20 DIAGNOSIS — F39 Unspecified mood [affective] disorder: Secondary | ICD-10-CM

## 2015-07-20 DIAGNOSIS — F1410 Cocaine abuse, uncomplicated: Secondary | ICD-10-CM | POA: Diagnosis present

## 2015-07-20 DIAGNOSIS — R079 Chest pain, unspecified: Secondary | ICD-10-CM | POA: Diagnosis present

## 2015-07-20 DIAGNOSIS — E785 Hyperlipidemia, unspecified: Secondary | ICD-10-CM | POA: Diagnosis present

## 2015-07-20 DIAGNOSIS — R739 Hyperglycemia, unspecified: Secondary | ICD-10-CM | POA: Diagnosis present

## 2015-07-20 DIAGNOSIS — R072 Precordial pain: Secondary | ICD-10-CM

## 2015-07-20 LAB — TROPONIN I: Troponin I: <0.03 ng/mL (ref <0.031)

## 2015-07-20 LAB — LIPID PANEL
CHOL/HDL RATIO: 6.3 ratio
CHOLESTEROL: 163 mg/dL (ref 0–200)
HDL: 26 mg/dL — ABNORMAL LOW (ref >40)
LDL CALC: 100 mg/dL — AB (ref 0–99)
Triglycerides: 187 mg/dL — ABNORMAL HIGH (ref <150)
VLDL: 37 mg/dL (ref 0–40)

## 2015-07-20 LAB — BRAIN NATRIURETIC PEPTIDE: B Natriuretic Peptide: 6.3 pg/mL (ref 0.0–100.0)

## 2015-07-20 LAB — TSH: TSH: 1.456 u[IU]/mL (ref 0.350–4.500)

## 2015-07-20 MED ORDER — HEPARIN SODIUM (PORCINE) 5000 UNIT/ML IJ SOLN
5000.0000 [IU] | Freq: Three times a day (TID) | INTRAMUSCULAR | Status: DC
Start: 2015-07-20 — End: 2015-07-20
  Administered 2015-07-20: 5000 [IU] via SUBCUTANEOUS
  Filled 2015-07-20: qty 1

## 2015-07-20 MED ORDER — ACETAMINOPHEN 325 MG PO TABS
650.0000 mg | ORAL_TABLET | ORAL | Status: DC | PRN
  Administered 2015-07-20 (×2): 650 mg via ORAL
  Filled 2015-07-20 (×2): qty 2

## 2015-07-20 MED ORDER — ONDANSETRON HCL 4 MG/2ML IJ SOLN: 4.0000 mg | Freq: Four times a day (QID) | INTRAMUSCULAR | Status: DC | PRN

## 2015-07-20 MED ORDER — ASPIRIN 81 MG PO CHEW
81.0000 mg | CHEWABLE_TABLET | Freq: Once | ORAL | Status: AC
  Administered 2015-07-20: 81 mg via ORAL
  Filled 2015-07-20: qty 1

## 2015-07-20 MED ORDER — GABAPENTIN 400 MG PO CAPS
800.0000 mg | ORAL_CAPSULE | Freq: Three times a day (TID) | ORAL | Status: DC
  Administered 2015-07-20: 800 mg via ORAL
  Filled 2015-07-20: qty 2

## 2015-07-20 MED ORDER — GABAPENTIN 800 MG PO TABS: 800.0000 mg | ORAL_TABLET | Freq: Three times a day (TID) | ORAL | Status: DC

## 2015-07-20 MED ORDER — ALBUTEROL SULFATE (2.5 MG/3ML) 0.083% IN NEBU: 2.5000 mg | INHALATION_SOLUTION | RESPIRATORY_TRACT | Status: DC | PRN

## 2015-07-20 MED ORDER — LISINOPRIL 20 MG PO TABS
20.0000 mg | ORAL_TABLET | Freq: Every day | ORAL | Status: DC
  Administered 2015-07-20: 20 mg via ORAL
  Filled 2015-07-20: qty 1

## 2015-07-20 MED ORDER — PRAVASTATIN SODIUM 40 MG PO TABS: 40.0000 mg | ORAL_TABLET | Freq: Every day | ORAL | Status: DC

## 2015-07-20 MED ORDER — FLUTICASONE PROPIONATE 50 MCG/ACT NA SUSP: 2.0000 | Freq: Every day | NASAL | Status: DC | PRN

## 2015-07-20 MED ORDER — LORAZEPAM 1 MG PO TABS
1.0000 mg | ORAL_TABLET | Freq: Once | ORAL | Status: AC
  Administered 2015-07-20: 1 mg via ORAL
  Filled 2015-07-20: qty 1

## 2015-07-20 NOTE — ED Notes (Signed)
Pt admits to cocaine use x 2 days (has been clean for 20 years up until this point), last use was 07/19/15 at 430am.  Pt sts he also just lost his sister, and has also lost his brother, mother and father, and is the only remaining member of his family, and is feeling a lot of anxiety and stress over this loss.    Pt sts he has not had any of his normal home meds in over 2 months (including anxiety medications, BP meds, etc) due to financial difficulties.

## 2015-07-20 NOTE — ED Notes (Signed)
RN attempted report to floor; RN to call back  

## 2015-07-20 NOTE — Discharge Summary (Signed)
Family Medicine [facility] Discharge Summary  Patient name: Richard Anderson Medical record number: [id number] Date of birth: 01-19-1971 Age: 45 y.o. Gender: male Date of Admission: 07/20/2015  Date of Discharge: 07/20/15 Admitting Physician: Moses Manners, MD  Primary Care Provider: Pcp Not In System Consultants: cardiology  Indication for Hospitalization: chest pain  Discharge Diagnoses/Problem List:  Patient Active Problem List   Diagnosis Date Noted  . Elevated troponin 07/20/2015  . Pain in the chest   . Chest pain 07/19/2015  . Hypertension   . Hyperlipidemia   . Hyperglycemia   . Drug abuse, cocaine type   . Mood disorder (HCC) 12/03/2014  . Suicidal ideations   . Migraine aura, persistent     Disposition: Home  Discharge Condition: stable  Discharge Exam: see previous progress note  Brief Hospital Course:  Patient admitted for ACS rule out after presented with chest pain after using cocaine for 2 days. On initial presentation to ED, EKG was within normal limits and chest pain resolved with Nitro. Pt left AMA from ED but returned a couple hours later after chest pain returned. Morphine, oxygen, and nitro prn were given. Pt placed on cardiac monitoring. ACS was ruled out with negative troponins and EKGs with sinus rhythm and no ST elevation. Cardiology was consulted and did not recommend any further work up. Chest pain likely due to cocaine use.   Issues for Follow Up:  1. Substance Abuse: Pt has only been using cocaine for last 2 days in the setting of the recent death of his sister. Pt states he does not want to use cocaine anymore. May benefit from addiction counseling if problem persists. 2. Will need to a PCP 3. Outpatient psychiatry for psychiatric medication management  Significant Procedures: none  Significant Labs and Imaging:   Recent Labs Lab 07/17/15 0901 07/19/15 1134 07/19/15 2253  WBC 8.3 8.6 10.5  HGB 14.5 14.9 14.8  HCT 46.2  45.9 45.7  PLT 301 309 306    Recent Labs Lab 07/17/15 0901 07/19/15 1134 07/19/15 2253  NA 141 141 141  K 3.9 4.1 3.8  CL 107 108 104  CO2 GLUCOSE 151* 138* 113*  BUN CREATININE 1.08 1.07 1.01  CALCIUM 8.7* 8.9 9.5  ALKPHOS 55  --   --   AST 23  --   --   ALT 28  --   --   ALBUMIN 3.4*  --   --     No results found.   Results/Tests Pending at Time of Discharge: none  Discharge Medications:    Medication List    STOP taking these medications        ALPRAZolam 1 MG tablet  Commonly known as:  XANAX     levofloxacin 750 MG tablet  Commonly known as:  LEVAQUIN     ondansetron 4 MG tablet  Commonly known as:  ZOFRAN     oxyCODONE-acetaminophen 5-325 MG tablet  Commonly known as:  PERCOCET/ROXICET     promethazine 25 MG suppository  Commonly known as:  PHENERGAN      TAKE these medications        albuterol 108 (90 Base) MCG/ACT inhaler  Commonly known as:  PROVENTIL HFA;VENTOLIN HFA  Inhale 2 puffs into the lungs every 4 (four) hours as needed for wheezing or shortness of breath.     aspirin 81 MG chewable tablet  Chew 81 mg by mouth daily.  fluticasone 50 MCG/ACT nasal spray  Commonly known as:  FLONASE  Place 2 sprays into both nostrils daily as needed for allergies.     gabapentin 800 MG tablet  Commonly known as:  NEURONTIN  Take 800 mg by mouth 3 (three) times daily.     lisinopril 20 MG tablet  Commonly known as:  PRINIVIL,ZESTRIL  Take 20 mg by mouth daily.     pravastatin 40 MG tablet  Commonly known as:  PRAVACHOL  Take 1 tablet (40 mg total) by mouth daily.        Discharge Instructions: Please refer to Patient Instructions section of EMR for full details.  Patient was counseled important signs and symptoms that should prompt return to medical care, changes in medications, dietary instructions, activity restrictions, and follow up appointments.   Follow-Up Appointments:     Follow-up Information    Follow  up with [facility] On 07/27/2015.   Why:  Appointment on 07/27/15 at 11:30 am with Scot Jun, PA   Contact information:   [address] [place] [address] [phone]      Beaulah Dinning, MD 07/20/2015, 11:19 AM PGY-1, [facility]

## 2015-07-20 NOTE — Care Management Note (Signed)
Case Management Note  Patient Details  Name: CLAYBORNE DIVIS MRN: [id number] Date of Birth: [birth year]  Subjective/Objective:  Pt admitted for chest pain. Pt has had several visits to the ED. Pt is from home with significant other. Pt states he has transportation. He is without insurance at this time. CM did reach out to [facility] Liaison with [facility]. Shanda Bumps was able to schedule pt an appointment with the [facility] for 07-27-15. Appointment was placed on AVS form.                    Action/Plan:Pt will be able to utilize the Pharmacy at the clinic. CM did discuss the need to keep the appointment that is scheduled. No further needs at this time.   Expected Discharge Date:                  Expected Discharge Plan:  Home/Self Care  In-House Referral:  Clinical Social Work  Discharge planning Services  CM Consult, Follow-up appt scheduled, [facility], Medication Assistance  Post Acute Care Choice:  NA Choice offered to:  NA  DME Arranged:  N/A DME Agency:  NA  HH Arranged:  NA HH Agency:  NA  Status of Service:  Completed, signed off  Medicare Important Message Given:    Date Medicare IM Given:    Medicare IM give by:    Date Additional Medicare IM Given:    Additional Medicare Important Message give by:     If discussed at Long Length of Stay Meetings, dates discussed:    Additional Comments:  Gala Lewandowsky, RN 07/20/2015, 11:22 AM

## 2015-07-20 NOTE — Discharge Instructions (Signed)
You were admitted to the hospital for chest pain. You did not have a heart attack but your heart was under some stress likely from cocaine use. You will need to follow up at the appointment time listed above.   Please stop using cocaine immediately as it is causing damage to your body and can kill you.   If you begin to experience chest pain again, please seek medical attention.   Stimulant Use Disorder-Cocaine Cocaine is one of a group of powerful drugs called stimulants. Cocaine has medical uses for stopping nosebleeds and for pain control before minor nose or dental surgery. However, cocaine is misused because of the effects that it produces. These effects include:   A feeling of extreme pleasure.  Alertness.  High energy. Common street names for cocaine include coke, crack, blow, snow, and nose candy. Cocaine is snorted, dissolved in water and injected, or smoked.  Stimulants are addictive because they activate regions of the brain that produce both the pleasurable sensation of "reward" and psychological dependence. Together, these actions account for loss of control and the rapid development of drug dependence. This means you become ill without the drug (withdrawal) and need to keep using it to function.  Stimulant use disorder is use of stimulants that disrupts your daily life. It disrupts relationships with family and friends and how you do your job. Cocaine increases your blood pressure and heart rate. It can cause a heart attack or stroke. Cocaine can also cause death from irregular heart rate or seizures. SYMPTOMS Symptoms of stimulant use disorder with cocaine include:  Use of cocaine in larger amounts or over a longer period of time than intended.  Unsuccessful attempts to cut down or control cocaine use.  A lot of time spent obtaining, using, or recovering from the effects of cocaine.  A strong desire or urge to use cocaine (craving).  Continued use of cocaine in spite of  major problems at work, school, or home because of use.  Continued use of cocaine in spite of relationship problems because of use.  Giving up or cutting down on important life activities because of cocaine use.  Use of cocaine over and over in situations when it is physically hazardous, such as driving a car.  Continued use of cocaine in spite of a physical problem that is likely related to use. Physical problems can include:  Malnutrition.  Nosebleeds.  Chest pain.  High blood pressure.  A hole that develops between the part of your nose that separates your nostrils (perforated nasal septum).  Lung and kidney damage.  Continued use of cocaine in spite of a mental problem that is likely related to use. Mental problems can include:  Schizophrenia-like symptoms.  Depression.  Bipolar mood swings.  Anxiety.  Sleep problems.  Need to use more and more cocaine to get the same effect, or lessened effect over time with use of the same amount of cocaine (tolerance).  Having withdrawal symptoms when cocaine use is stopped, or using cocaine to reduce or avoid withdrawal symptoms. Withdrawal symptoms include:  Depressed or irritable mood.  Low energy or restlessness.  Bad dreams.  Poor or excessive sleep.  Increased appetite. DIAGNOSIS Stimulant use disorder is diagnosed by your health care provider. You may be asked questions about your cocaine use and how it affects your life. A physical exam may be done. A drug screen may be ordered. You may be referred to a mental health professional. The diagnosis of stimulant use disorder requires at  least two symptoms within 12 months. The type of stimulant use disorder depends on the number of signs and symptoms you have. The type may be:  Mild. Two or three signs and symptoms.  Moderate. Four or five signs and symptoms.  Severe. Six or more signs and symptoms. TREATMENT Treatment for stimulant use disorder is usually provided by  mental health professionals with training in substance use disorders. The following options are available:  Counseling or talk therapy. Talk therapy addresses the reasons you use cocaine and ways to keep you from using again. Goals of talk therapy include:  Identifying and avoiding triggers for use.  Handling cravings.  Replacing use with healthy activities.  Support groups. Support groups provide emotional support, advice, and guidance.  Medicine. Certain medicines may decrease cocaine cravings or withdrawal symptoms. HOME CARE INSTRUCTIONS  Take medicines only as directed by your health care provider.  Identify the people and activities that trigger your cocaine use and avoid them.  Keep all follow-up visits as directed by your health care provider. SEEK MEDICAL CARE IF:  Your symptoms get worse or you relapse.  You are not able to take medicines as directed. SEEK IMMEDIATE MEDICAL CARE IF:  You have serious thoughts about hurting yourself or others.  You have a seizure, chest pain, sudden weakness, or loss of speech or vision. FOR MORE INFORMATION  National Institute on Drug Abuse: http://www.price-smith.com/  Substance Abuse and Mental Health Services Administration: SkateOasis.com.pt   This information is not intended to replace advice given to you by your health care provider. Make sure you discuss any questions you have with your health care provider.   Document Released: 05/23/2000 Document Revised: 06/16/2014 Document Reviewed: 06/08/2013 Elsevier Interactive Patient Education [employer]

## 2015-07-20 NOTE — ED Provider Notes (Signed)
CSN: [id number]     Arrival date & time 07/19/15  10/21/2237 History   By signing my name below, I, Arlan Organ, attest that this documentation has been prepared under the direction and in the presence of Laurence Spates, MD.  Electronically Signed: Arlan Organ, ED Scribe. 07/20/2015. 1:08 AM.   Chief Complaint  Patient presents with  . Chest Pain   The history is provided by the patient. No language interpreter was used.    HPI Comments: RASHOD GOUGEON is a 45 y.o. male with a PMHx of HTN, CAD, GERD, and hyperlipidemia who presents to the Emergency Department complaining of constant, ongoing chest pain onset 9:00 PM this evening. Pain is described as pressure. No aggravating or alleviating factors at this time. Three doses of baby ASA attempted prior to arrival without any relief. No recent fever, chills, nausea, or vomiting. Last use of cocaine at 4;30 AM yesterday morning. Mr. Silvernail was evaluated earlier this afternoon for same and admitted but left AMA as he was "freaked out". Pt has an extensive family history of heart disease with mother, father, and sister all dying from heart related issues. Of note, patient has been off all medications for 2 months due to financial difficulty.  PCP: Pcp Not In System    Past Medical History  Diagnosis Date  . Hypertension   . Obesity   . Arthritis   . Renal disorder   . Depression   . Migraine aura, persistent     since childhood  . Head ache   . Coronary artery disease   . GERD (gastroesophageal reflux disease)   . Asthma   . Suicide attempt by drug ingestion [facility]) 22-Oct-2010    After his father died  . Hyperlipidemia   . Hyperglycemia   . OSA (obstructive sleep apnea)   . Drug abuse, cocaine type    Past Surgical History  Procedure Laterality Date  . Cardiac catheterization      x2, last one in 10/22/10, luminal irregularities in multiple vessels, EF 45%  . Appendectomy     Family History  Problem Relation Age of Onset  . Hypertension  Mother   . Hyperlipidemia Mother   . Hypertension Brother   . Leukemia Brother   . Congestive Heart Failure Sister    Social History  Substance Use Topics  . Smoking status: Current Every Day Smoker -- 0.00 packs/day    Types: Cigarettes  . Smokeless tobacco: None  . Alcohol Use: No    Review of Systems  A complete 10 system review of systems was obtained and all systems are negative except as noted in the HPI and PMH.    Allergies  Bee venom; Hornet venom; Aspartame and phenylalanine; Doxycycline; Phenylalanine; and Trazodone and nefazodone  Home Medications   Prior to Admission medications   Medication Sig Start Date End Date Taking? Authorizing Provider  albuterol (PROVENTIL HFA;VENTOLIN HFA) 108 (90 BASE) MCG/ACT inhaler Inhale 2 puffs into the lungs every 4 (four) hours as needed for wheezing or shortness of breath. 05/16/15   Shon Baton, MD  ALPRAZolam [name]) 1 MG tablet Take 1 mg by mouth 3 (three) times daily as needed for anxiety.    Historical Provider, MD  ampicillin (PRINCIPEN) 500 MG capsule Take 500 mg by mouth 2 (two) times daily.    Historical Provider, MD  aspirin 81 MG chewable tablet Chew 81 mg by mouth daily.    Historical Provider, MD  fluticasone (FLONASE) 50 MCG/ACT nasal spray  Place 2 sprays into both nostrils daily as needed for allergies.     Historical Provider, MD  gabapentin (NEURONTIN) 800 MG tablet Take 800 mg by mouth 3 (three) times daily.    Historical Provider, MD  levofloxacin (LEVAQUIN) 750 MG tablet Take 1 tablet (750 mg total) by mouth daily. 05/25/15   Tomasita Crumble, MD  lisinopril (PRINIVIL,ZESTRIL) 20 MG tablet Take 20 mg by mouth daily.    Historical Provider, MD  metFORMIN (GLUCOPHAGE) 500 MG tablet Take 500 mg by mouth daily with breakfast.    Historical Provider, MD  ondansetron (ZOFRAN) 4 MG tablet Take 1 tablet (4 mg total) by mouth every 6 (six) hours. 07/17/15   Jerelyn Scott, MD  oxyCODONE-acetaminophen (PERCOCET/ROXICET) 5-325  MG tablet Take 1-2 tablets by mouth every 6 (six) hours as needed for severe pain. 05/16/15   Shon Baton, MD  pravastatin (PRAVACHOL) 40 MG tablet Take 1 tablet (40 mg total) by mouth daily. 10/20/14   [name], PA-C  promethazine (PHENERGAN) 25 MG suppository Place 1 suppository (25 mg total) rectally every 6 (six) hours as needed for nausea or vomiting. 07/17/15   Jerelyn Scott, MD   Triage Vitals: BP 150/105 mmHg  Pulse 95  Temp(Src) 98.2 F (36.8 C) (Oral)  Resp 20  SpO2 98%   Physical Exam  Constitutional: He is oriented to person, place, and time. He appears well-developed and well-nourished. No distress.  Morbidly obese male  Mildly anxious   HENT:  Head: Normocephalic and atraumatic.  Moist mucous membranes  Eyes: Conjunctivae are normal. Pupils are equal, round, and reactive to light.  Neck: Neck supple.  Cardiovascular: Normal rate, regular rhythm and normal heart sounds.   No murmur heard. Distant heart sounds   Pulmonary/Chest: Effort normal and breath sounds normal.  Abdominal: Soft. Bowel sounds are normal. He exhibits no distension. There is no tenderness.  Musculoskeletal: He exhibits no edema.  Neurological: He is alert and oriented to person, place, and time.  Fluent speech  Skin: Skin is warm. He is diaphoretic.  Psychiatric:  Avoids eye contact Mildly anxious  Nursing note and vitals reviewed.   ED Course  Procedures (including critical care time)  DIAGNOSTIC STUDIES: Oxygen Saturation is 98% on RA, Normal by my interpretation.    COORDINATION OF CARE: 1:04 AM- Will order BMP, CBC, EKG, and i-stat troponin. Discussed treatment plan with pt at bedside and pt agreed to plan.     Labs Review Labs Reviewed  BASIC METABOLIC PANEL - Abnormal; Notable for the following:    Glucose, Bld 113 (*)    All other components within normal limits  LIPID PANEL - Abnormal; Notable for the following:    Triglycerides 187 (*)    HDL 26 (*)    LDL  Cholesterol 100 (*)    All other components within normal limits  CBC  BRAIN NATRIURETIC PEPTIDE  TSH  TROPONIN I  TROPONIN I  URINE RAPID DRUG SCREEN, HOSP PERFORMED  HEMOGLOBIN A1C  I-STAT TROPOININ, ED    Imaging Review Dg Chest 2 View  07/19/2015  CLINICAL DATA:  Shortness of breath with exertion. EXAM: CHEST  2 VIEW COMPARISON:  None. FINDINGS: The heart size and mediastinal contours are within normal limits. Both lungs are clear. The visualized skeletal structures are unremarkable. IMPRESSION: No active cardiopulmonary disease. Electronically Signed   By: Elige Ko   On: 07/19/2015 12:25   I have personally reviewed and evaluated these lab results as part of my medical decision-making.  EKG Interpretation   Date/Time:  Thursday July 19 2015 22:48:05 EST Ventricular Rate:  119 PR Interval:  138 QRS Duration: 82 QT Interval:  336 QTC Calculation: 472 R Axis:   18 Text Interpretation:  Sinus tachycardia Otherwise normal ECG tachycardia  new from previous EKG Otherwise no significant change Confirmed by LITTLE  MD, RACHEL ([id number]) on 07/20/2015 1:05:02 AM      MDM   Final diagnoses:  Chest pain, unspecified chest pain type  Drug abuse, cocaine type   Pt w/ h/o CAD p/w chest pain in setting of recent cocaine use. He was evaluated earlier today and admitted after initial troponin 0.25. He later left AMA but returned because his CP worsened at home. On exam, he was anxious, diaphoretic but in no acute distress. Vital signs notable for mild hypertension. EKG showed sinus tachycardia without any obvious ischemic changes. Patient had taken aspirin prior to arrival. Chest x-ray from earlier today shows no acute findings. Obtained basic labs and i-STAT troponin which was 0. Discussed admission with Dr. Natale Milch w/ original admitting team, Family Medicine, and pt admitted for further evaluation.   I personally performed the services described in this documentation, which was  scribed in my presence. The recorded information has been reviewed and is accurate.   Laurence Spates, MD 07/20/15 [phone]

## 2015-07-20 NOTE — Progress Notes (Signed)
    Subjective:  No chest pain or shortness of breath this morning. Left AMA yesterday evening and then came back to the hospital because of more chest pain.  Objective:  Vital Signs in the last 24 hours: Temp:  [97.8 F (36.6 C)-98.2 F (36.8 C)] 97.8 F (36.6 C) (02/10 0501) Pulse Rate:  [55-95] 69 (02/10 0501) Resp:  [15-28] 18 (02/10 0501) BP: (128-153)/(51-118) 153/93 mmHg (02/10 0501) SpO2:  [94 %-99 %] 96 % (02/10 0501) Weight:  [145.151 kg (320 lb)-187.562 kg (413 lb 8 oz)] 187.562 kg (413 lb 8 oz) (02/10 0450)  Intake/Output from previous day:    Physical Exam: Pt is alert and oriented, morbidly obese male in NAD HEENT: normal Neck: JVP - normal Lungs: CTA bilaterally CV: RRR without murmur or gallop Abd: soft, NT, Positive BS, no hepatomegaly Ext: no C/C/E Skin: warm/dry no rash   Lab Results:  Recent Labs  07/19/15 1134 07/19/15 2253  WBC 8.6 10.5  HGB 14.9 14.8  PLT 309 306    Recent Labs  07/19/15 1134 07/19/15 2253  NA 141 141  K 4.1 3.8  CL 108 104  CO2 25 27  GLUCOSE 138* 113*  BUN 13 13  CREATININE 1.07 1.01    Recent Labs  07/20/15 0530  TROPONINI <0.03    Cardiac Studies: Point-of-care troponin 0.25, troponin sent to the lab are all negative. BNP is 6  Tele: Normal sinus rhythm  Assessment/Plan:  Chest pain, likely related to cocaine use. The patient has had 2 cardiac catheterizations showing patent coronary arteries without significant obstruction. His cardiac markers are negative. I suspect the point-of-care troponin elevation is erroneous as his lab work otherwise demonstrates negative troponins. BNP is also normal. Chest x-ray shows no acute abnormalities. Counseling done regarding polysubstance abuse. No further cardiac testing indicated at this time.   Tonny Bollman, M.D. 07/20/2015, 9:11 AM

## 2015-07-20 NOTE — H&P (Signed)
Family Medicine Teaching [facility] Admission History and Physical Service Pager: [phone]  Patient name: Richard Anderson Medical record number: [id number] Date of birth: 04/20/1971 Age: 45 y.o. Gender: male  Primary Care Provider: Pcp Not In System Consultants: Cardiology Code Status: FULL  Chief Complaint: chest pain  Assessment and Plan: Richard Anderson is a 45 y.o. male presenting with chest pain. PMH is significant for HTN, obesity, asthma, arthritis, depression, CAD (clean cath 2012), GERD.   Chest Pain: Most likely 2/2 cocaine-induced vasospasm. Differential also includes NSTEMI vs anxiety/panic attack. Past angina work-up has included myocardial perfusion study 2009 showing stress-induced ischemia at apex, fixed defects of inferior/anterior walls, and per pt report, 2012 cath was clean. Has not been on any medications. Pain relieved with Nitro. CBC and BMP WNL. Troponin initially elevated to 0.25 when in ED earlier today; now WNL at 0. EKG with sinus rhythm and no ST elevation. Clinically euvolemic, no h/o CHF.  - Place in observation, Hensel attending. HEART score: 4 (1/0/0/2/1 with previous troponin) - Cardiac Monitoring - AM EKG - Trend Troponins - F/u BNP, TSH, Lipid Panel, Hb A1c - Will consult cardiology in AM, appreciate recs - UDS - Morphine, oxygen, NTG prn chest pain/dyspnea. Hold beta blocker - Continue statin, consider augmenting to high-intensity  Depression and anxiety: Patient acutely depressed in the setting of recent death of his sister 2 days ago. Has not been on any antidepressant medications but states he has a history of depression. Previously prescribed Xanax. No active suicidal or homicidal ideations at this time. Though judgment seems to be poor, I believe he does have capacity to make medical decisions. - Will continue to monitor mood - Consult chaplain services - Would benefit from outpatient psychiatry   FEN/GI: SLIV, heart healthy carb  modified diet Prophylaxis: subQ heparin  Disposition: place in observation  History of Present Illness:  Richard Anderson is a 45 y.o. male presenting with chest pain.   Patient initially presented to ED on 2/9, after he was woken up from sleep with substernal chest pain, diaphoresis, and nausea with emesis. Two days ago, he started using cocaine to cope with his emotions after his sister died from congestive heart failure. He had previously been sober from cocaine for 20 years. He reports using 6 grams in the last two days. His last use was at 4:00AM 2/9, when he laced marijuana with cocaine. He also drank 750 mL (a fifth) of rum. He has snorted cocaine in the past, but denies IVDU. No numbness, tingling, weakness, diarrhea or constipation. He received nitroglycerin with symptomatic relief.  Before patient could be taken to his hospital room after admission on 2/9, he left AMA. He reports that he had family issues to take care of at home, primarily with his partner. His chest pain initially improved after leaving, but then worsened again when he was sitting on the couch. At this point, he decided to return to the ED. He denies using any additional drugs. He does report a HA now that began earlier this evening. He says his chest pain has persisted but has not worsened and is described as "vise-like," waxing-waning, substernal, moderate/"irritating," radiating to left shoulder, not related to position, exertion (came on at rest), or breathing. Some associated HA, nausea and 5 episodes of non-bloody emesis. He is not short of breath.   Review Of Systems: Per HPI. Otherwise the remainder of the systems were negative.  Patient Active Problem List   Diagnosis Date Noted  .  Chest pain 07/19/2015  . Hypertension   . Hyperlipidemia   . Hyperglycemia   . Drug abuse, cocaine type   . Mood disorder (HCC) 12/03/2014  . Suicidal ideations   . Migraine aura, persistent     Past Medical History: Past  Medical History  Diagnosis Date  . Hypertension   . Obesity   . Arthritis   . Renal disorder   . Depression   . Migraine aura, persistent     since childhood  . Head ache   . Coronary artery disease   . GERD (gastroesophageal reflux disease)   . Asthma   . Suicide attempt by drug ingestion [facility]) 10-21-2010    After his father died  . Hyperlipidemia   . Hyperglycemia   . OSA (obstructive sleep apnea)   . Drug abuse, cocaine type     Past Surgical History: Past Surgical History  Procedure Laterality Date  . Cardiac catheterization      x2, last one in 10-21-2010, luminal irregularities in multiple vessels, EF 45%  . Appendectomy      Social History: Social History  Substance Use Topics  . Smoking status: Current Every Day Smoker -- 0.00 packs/day    Types: Cigarettes  . Smokeless tobacco: None  . Alcohol Use: No   Additional social history: Sister died of heart failure three days ago.   Please also refer to relevant sections of EMR.  Family History: Family History  Problem Relation Age of Onset  . Hypertension Mother   . Hyperlipidemia Mother   . Hypertension Brother   . Leukemia Brother   . Congestive Heart Failure Sister     Allergies and Medications: Allergies  Allergen Reactions  . Bee Venom Anaphylaxis  . Hornet Venom Anaphylaxis  . Aspartame And Phenylalanine     Migraine  . Doxycycline Other (See Comments)    Pancreatis   . Phenylalanine     Migraine  . Trazodone And Nefazodone Other (See Comments)    priapism   No current facility-administered medications on file prior to encounter.   Current Outpatient Prescriptions on File Prior to Encounter  Medication Sig Dispense Refill  . albuterol (PROVENTIL HFA;VENTOLIN HFA) 108 (90 BASE) MCG/ACT inhaler Inhale 2 puffs into the lungs every 4 (four) hours as needed for wheezing or shortness of breath. 1 Inhaler 0  . ALPRAZolam (XANAX) 1 MG tablet Take 1 mg by mouth 3 (three) times daily as needed for anxiety.     Marland Kitchen aspirin 81 MG chewable tablet Chew 81 mg by mouth daily.    . fluticasone (FLONASE) 50 MCG/ACT nasal spray Place 2 sprays into both nostrils daily as needed for allergies.     Marland Kitchen gabapentin (NEURONTIN) 800 MG tablet Take 800 mg by mouth 3 (three) times daily.    Marland Kitchen lisinopril (PRINIVIL,ZESTRIL) 20 MG tablet Take 20 mg by mouth daily.    . ondansetron (ZOFRAN) 4 MG tablet Take 1 tablet (4 mg total) by mouth every 6 (six) hours. 12 tablet 0  . pravastatin (PRAVACHOL) 40 MG tablet Take 1 tablet (40 mg total) by mouth daily. 30 tablet 0  . promethazine (PHENERGAN) 25 MG suppository Place 1 suppository (25 mg total) rectally every 6 (six) hours as needed for nausea or vomiting. 12 each 0  . levofloxacin (LEVAQUIN) 750 MG tablet Take 1 tablet (750 mg total) by mouth daily. (Patient not taking: Reported on 07/20/2015) 4 tablet 0  . oxyCODONE-acetaminophen (PERCOCET/ROXICET) 5-325 MG tablet Take 1-2 tablets by mouth every 6 (  six) hours as needed for severe pain. (Patient not taking: Reported on 07/20/2015) 15 tablet 0  . [DISCONTINUED] metoprolol tartrate (LOPRESSOR) 25 MG tablet Take 1 tablet (25 mg total) by mouth once. (Patient taking differently: Take 25 mg by mouth 2 (two) times daily. ) 30 tablet 0    Objective: BP 146/80 mmHg  Pulse 79  Temp(Src) 98.2 F (36.8 C) (Oral)  Resp 18  SpO2 96% Exam: General: sleeping in bed snoring loudly upon entering room, easily able to arouse; in NAD; smells strongly of cigarette smoke Eyes: EOMI, no scleral icterus or discharge ENTM: MMM Neck: Supple, FROM Cardiovascular: Distant heart sounds likely 2/2 body habitus; regular rate, no murmur, rub, or gallop; distal pulses 2+ Respiratory: CTAB, normal work of breathing, no rales, wheezes, or focal findings Abdomen: obese; soft, non-tender, non-distended, +BS MSK: normal ROM, no edema or tenderness Skin: diaphoretic, no rashes or bruises noted, AC fossae unremarkable Neuro: A&Ox3, no focal deficits Psych:  Anxious  Labs and Imaging: CBC BMET   Recent Labs Lab 07/19/15 2253  WBC 10.5  HGB 14.8  HCT 45.7  PLT 306    Recent Labs Lab 07/19/15 2253  NA 141  K 3.8  CL 104  CO2 27  BUN 13  CREATININE 1.01  GLUCOSE 113*  CALCIUM 9.5      Marquette Saa, MD 07/20/2015, 3:01 AM PGY-1, [facility] FPTS Intern pager: [phone], text pages welcome   I have seen and evaluated the patient with Dr. Natale Milch. I am in agreement with the note above in its revised form. My additions are in red.  Angee Gupton B. Jarvis Newcomer, MD, PGY-3 07/20/2015 3:35 AM

## 2015-07-20 NOTE — Hospital Discharge Follow-Up (Signed)
[facility]:  This Case Manager received communication from Tomi Bamberger, RN CM questioning if patient meets criteria for [facility].  Spoke with Dr. Venetia Night who indicated patient does not meet [facility] Care Clinic criteria so hospital follow-up appointment obtained on 07/27/15 at 1130 with Scot Jun, PA at [facility] and [facility]. AVS updated. Voicemail left for Tomi Bamberger, RN CM providing update.

## 2015-07-21 LAB — HEMOGLOBIN A1C
Hgb A1c MFr Bld: 6.4 % — ABNORMAL HIGH (ref 4.8–5.6)
Mean Plasma Glucose: 137 mg/dL

## 2015-07-24 ENCOUNTER — Emergency Department (HOSPITAL_COMMUNITY): Payer: MEDICAID

## 2015-07-24 ENCOUNTER — Encounter (HOSPITAL_COMMUNITY): Payer: Self-pay | Admitting: Neurology

## 2015-07-24 ENCOUNTER — Emergency Department (HOSPITAL_COMMUNITY)
Admission: EM | Admit: 2015-07-24 | Discharge: 2015-07-24 | Disposition: A | Discharge status: Home / Self Care | Payer: MEDICAID | Attending: Emergency Medicine | Admitting: Emergency Medicine

## 2015-07-24 DIAGNOSIS — Z9889 Other specified postprocedural states: Secondary | ICD-10-CM | POA: Insufficient documentation

## 2015-07-24 DIAGNOSIS — Z79899 Other long term (current) drug therapy: Secondary | ICD-10-CM | POA: Insufficient documentation

## 2015-07-24 DIAGNOSIS — Z8719 Personal history of other diseases of the digestive system: Secondary | ICD-10-CM | POA: Insufficient documentation

## 2015-07-24 DIAGNOSIS — J45909 Unspecified asthma, uncomplicated: Secondary | ICD-10-CM | POA: Insufficient documentation

## 2015-07-24 DIAGNOSIS — Z8659 Personal history of other mental and behavioral disorders: Secondary | ICD-10-CM | POA: Insufficient documentation

## 2015-07-24 DIAGNOSIS — Z87448 Personal history of other diseases of urinary system: Secondary | ICD-10-CM | POA: Insufficient documentation

## 2015-07-24 DIAGNOSIS — I2510 Atherosclerotic heart disease of native coronary artery without angina pectoris: Secondary | ICD-10-CM | POA: Insufficient documentation

## 2015-07-24 DIAGNOSIS — E785 Hyperlipidemia, unspecified: Secondary | ICD-10-CM | POA: Insufficient documentation

## 2015-07-24 DIAGNOSIS — Z8669 Personal history of other diseases of the nervous system and sense organs: Secondary | ICD-10-CM | POA: Insufficient documentation

## 2015-07-24 DIAGNOSIS — Z7982 Long term (current) use of aspirin: Secondary | ICD-10-CM | POA: Insufficient documentation

## 2015-07-24 DIAGNOSIS — F17210 Nicotine dependence, cigarettes, uncomplicated: Secondary | ICD-10-CM | POA: Insufficient documentation

## 2015-07-24 DIAGNOSIS — M1990 Unspecified osteoarthritis, unspecified site: Secondary | ICD-10-CM | POA: Insufficient documentation

## 2015-07-24 DIAGNOSIS — I10 Essential (primary) hypertension: Secondary | ICD-10-CM | POA: Insufficient documentation

## 2015-07-24 DIAGNOSIS — G43509 Persistent migraine aura without cerebral infarction, not intractable, without status migrainosus: Secondary | ICD-10-CM | POA: Insufficient documentation

## 2015-07-24 DIAGNOSIS — R079 Chest pain, unspecified: Secondary | ICD-10-CM | POA: Insufficient documentation

## 2015-07-24 LAB — I-STAT TROPONIN, ED: TROPONIN I, POC: 0 ng/mL (ref 0.00–0.08)

## 2015-07-24 LAB — CBC
HCT: 44.7 % (ref 39.0–52.0)
Hemoglobin: 14.7 g/dL (ref 13.0–17.0)
MCH: 28.4 pg (ref 26.0–34.0)
MCHC: 32.9 g/dL (ref 30.0–36.0)
MCV: 86.5 fL (ref 78.0–100.0)
PLATELETS: 294 10*3/uL (ref 150–400)
RBC: 5.17 MIL/uL (ref 4.22–5.81)
RDW: 14.2 % (ref 11.5–15.5)
WBC: 11.5 10*3/uL — ABNORMAL HIGH (ref 4.0–10.5)

## 2015-07-24 LAB — BASIC METABOLIC PANEL
Anion gap: 11 (ref 5–15)
BUN: 13 mg/dL (ref 6–20)
CHLORIDE: 104 mmol/L (ref 101–111)
CO2: 25 mmol/L (ref 22–32)
CREATININE: 1.04 mg/dL (ref 0.61–1.24)
Calcium: 9.1 mg/dL (ref 8.9–10.3)
GFR calc non Af Amer: >60 mL/min (ref >60)
GLUCOSE: 112 mg/dL — AB (ref 65–99)
Potassium: 4.1 mmol/L (ref 3.5–5.1)
Sodium: 140 mmol/L (ref 135–145)

## 2015-07-24 MED ORDER — AEROCHAMBER Z-STAT PLUS/MEDIUM MISC
1.0000 | Freq: Once | Status: DC
  Filled 2015-07-24: qty 1

## 2015-07-24 MED ORDER — ALBUTEROL SULFATE HFA 108 (90 BASE) MCG/ACT IN AERS
2.0000 | INHALATION_SPRAY | RESPIRATORY_TRACT | Status: DC | PRN
  Administered 2015-07-24: 2 via RESPIRATORY_TRACT
  Filled 2015-07-24: qty 6.7

## 2015-07-24 NOTE — ED Provider Notes (Signed)
CSN: [id number]     Arrival date & time 07/24/15  1615 History   First MD Initiated Contact with Patient 07/24/15 2001     Chief Complaint  Patient presents with  . Chest Pain     (Consider location/radiation/quality/duration/timing/severity/associated sxs/prior Treatment) HPI   Richard Anderson is a 45 y.o. male who presents for evaluation of chest discomfort, radiating to neck and left arm. Pain started when he was watching TV this evening around 4 PM. Been home from work because he felt dizzy earlier and had a headache. Chest discomfort is mild and described as "an annoyance". He was hospitalized, discharged 4 days ago after an episode of chest pain, which required admission and evaluation by cardiology. He had a spurious elevation of troponin I, and was felt stable for discharge by cardiology after cycling troponins. Patient is due to follow-up with outpatient treatment center to help initiate medical care including determination of his current needs for medications. He started working a job about 2 weeks ago, and feels like it is doing well. He is getting along well with his partner. He states that he is not currently using cocaine.  He is been upset recently because his sister died a few weeks ago. There are no other known modifying factors.   Past Medical History  Diagnosis Date  . Hypertension   . Obesity   . Arthritis   . Renal disorder   . Depression   . Migraine aura, persistent     since childhood  . Head ache   . Coronary artery disease   . GERD (gastroesophageal reflux disease)   . Asthma   . Suicide attempt by drug ingestion [facility]) 2010-11-04    After his father died  . Hyperlipidemia   . Hyperglycemia   . OSA (obstructive sleep apnea)   . Drug abuse, cocaine type    Past Surgical History  Procedure Laterality Date  . Cardiac catheterization      x2, last one in 04-Nov-2010, luminal irregularities in multiple vessels, EF 45%  . Appendectomy     Family History  Problem  Relation Age of Onset  . Hypertension Mother   . Hyperlipidemia Mother   . Hypertension Brother   . Leukemia Brother   . Congestive Heart Failure Sister    Social History  Substance Use Topics  . Smoking status: Current Every Day Smoker -- 0.00 packs/day    Types: Cigarettes  . Smokeless tobacco: None  . Alcohol Use: No    Review of Systems  All other systems reviewed and are negative.     Allergies  Bee venom; Hornet venom; Aspartame and phenylalanine; Doxycycline; Phenylalanine; and Trazodone and nefazodone  Home Medications   Prior to Admission medications   Medication Sig Start Date End Date Taking? Authorizing Provider  albuterol (PROVENTIL HFA;VENTOLIN HFA) 108 (90 BASE) MCG/ACT inhaler Inhale 2 puffs into the lungs every 4 (four) hours as needed for wheezing or shortness of breath. 05/16/15   Shon Baton, MD  aspirin 81 MG chewable tablet Chew 81 mg by mouth daily.    Historical Provider, MD  fluticasone (FLONASE) 50 MCG/ACT nasal spray Place 2 sprays into both nostrils daily as needed for allergies.     Historical Provider, MD  gabapentin (NEURONTIN) 800 MG tablet Take 800 mg by mouth 3 (three) times daily.    Historical Provider, MD  lisinopril (PRINIVIL,ZESTRIL) 20 MG tablet Take 20 mg by mouth daily.    Historical Provider, MD  pravastatin (PRAVACHOL) 40  MG tablet Take 1 tablet (40 mg total) by mouth daily. 10/20/14   [name], PA-C   BP 136/87 mmHg  Pulse 80  Temp(Src) 97.6 F (36.4 C) (Oral)  Resp 16  SpO2 98% Physical Exam  Constitutional: He is oriented to person, place, and time. He appears well-developed.  Morbidly obese  HENT:  Head: Normocephalic and atraumatic.  Right Ear: External ear normal.  Left Ear: External ear normal.  Eyes: Conjunctivae and EOM are normal. Pupils are equal, round, and reactive to light.  Neck: Normal range of motion and phonation normal. Neck supple.  Cardiovascular: Normal rate, regular rhythm and normal heart  sounds.   Pulmonary/Chest: Effort normal and breath sounds normal. No respiratory distress. He has no wheezes. He exhibits no bony tenderness.  Abdominal: Soft. There is no tenderness.  Musculoskeletal: Normal range of motion.  Neurological: He is alert and oriented to person, place, and time. No cranial nerve deficit or sensory deficit. He exhibits normal muscle tone. Coordination normal.  Skin: Skin is warm, dry and intact.  Psychiatric: He has a normal mood and affect. His behavior is normal. Judgment and thought content normal.  Nursing note and vitals reviewed.   ED Course  Procedures (including critical care time)  Note from Dr. Excell Seltzer: Chest pain, likely related to cocaine use. The patient has had 2 cardiac catheterizations showing patent coronary arteries without significant obstruction. His cardiac markers are negative. I suspect the point-of-care troponin elevation is erroneous as his lab work otherwise demonstrates negative troponins. BNP is also normal. Chest x-ray shows no acute abnormalities. Counseling done regarding polysubstance abuse. No further cardiac testing indicated at this time.   Richard Anderson, M.D. 07/20/2015, 9:11 AM  Medications  albuterol (PROVENTIL HFA;VENTOLIN HFA) 108 (90 Base) MCG/ACT inhaler 2 puff (not administered)  aerochamber Z-Stat Plus/medium 1 each (not administered)    Patient Vitals for the past 24 hrs:  BP Temp Temp src Pulse Resp SpO2  07/24/15 2015 136/87 mmHg - - 80 16 98 %  07/24/15 1621 (!) 146/114 mmHg 97.6 F (36.4 C) Oral 94 18 97 %    8:11 PM Reevaluation with update and discussion. After initial assessment and treatment, an updated evaluation reveals he is comfortable. Findings discussed with patient, all questions answered. Amani Marseille L        Labs Review Labs Reviewed  BASIC METABOLIC PANEL - Abnormal; Notable for the following:    Glucose, Bld 112 (*)    All other components within normal limits  CBC - Abnormal;  Notable for the following:    WBC 11.5 (*)    All other components within normal limits  URINE RAPID DRUG SCREEN, HOSP PERFORMED  I-STAT TROPOININ, ED    Imaging Review Dg Chest 2 View  07/24/2015  CLINICAL DATA:  Left-sided chest pain.  Left-sided jaw and arm pain. EXAM: CHEST  2 VIEW COMPARISON:  10/05/2014, 02/24/2015 FINDINGS: There is no focal parenchymal opacity. There is no pleural effusion or pneumothorax. The heart and mediastinal contours are unremarkable. The osseous structures are unremarkable. IMPRESSION: No active cardiopulmonary disease. Electronically Signed   By: Elige Ko   On: 07/24/2015 16:49   I have personally reviewed and evaluated these images and lab results as part of my medical decision-making.   EKG Interpretation   Date/Time:  Tuesday July 24 2015 16:21:09 EST Ventricular Rate:  110 PR Interval:  134 QRS Duration: 78 QT Interval:  344 QTC Calculation: 465 R Axis:   11 Text Interpretation:  Sinus  tachycardia Otherwise normal ECG since last  tracing no significant change Confirmed by [facility]  MD, Ange Puskas [phone]) on  07/24/2015 8:05:50 PM      MDM   Final diagnoses:  Nonspecific chest pain    Nonspecific chest pain. Doubt ACS, PE or pneumonia. He is out of his multiple medications, but has normal vital signs and essentially negative evaluation in the ED today.  Nursing Notes Reviewed/ Care Coordinated Applicable Imaging Reviewed Interpretation of Laboratory Data incorporated into ED treatment  The patient appears reasonably screened and/or stabilized for discharge and I doubt any other medical condition or other [facility] requiring further screening, evaluation, or treatment in the ED at this time prior to discharge.  Plan: Home Medications- Albuterol Inhaler; Home Treatments- rest; return here if the recommended treatment, does not improve the symptoms; Recommended follow up- PCP as scheduled     Mancel Bale, MD 07/24/15 2032

## 2015-07-24 NOTE — Discharge Instructions (Signed)

## 2015-07-24 NOTE — ED Notes (Signed)
Pt returned from xray holding onto chest stating "MY chest pain is worse, my jaw hurts reallly bad" placed pt in triage waiting area.

## 2015-07-24 NOTE — ED Notes (Signed)
Pt reports onset cp left sided 1.5 hrs ago while watching tv. Was in hospital last week for elevated troponin after cocaine use. Denies drug use today. BP 146/114, Pt is a x 4. Reports vomiting x 2 with cp, pain is radiating down left arm.

## 2015-07-27 ENCOUNTER — Inpatient Hospital Stay: Payer: Self-pay

## 2015-08-13 ENCOUNTER — Emergency Department (HOSPITAL_COMMUNITY)
Admission: EM | Admit: 2015-08-13 | Discharge: 2015-08-13 | Disposition: A | Discharge status: Home / Self Care | Payer: Medicaid - Out of State | Attending: Emergency Medicine | Admitting: Emergency Medicine

## 2015-08-13 ENCOUNTER — Encounter (HOSPITAL_COMMUNITY): Payer: Self-pay

## 2015-08-13 DIAGNOSIS — Z79899 Other long term (current) drug therapy: Secondary | ICD-10-CM | POA: Insufficient documentation

## 2015-08-13 DIAGNOSIS — Z7982 Long term (current) use of aspirin: Secondary | ICD-10-CM | POA: Insufficient documentation

## 2015-08-13 DIAGNOSIS — Z87448 Personal history of other diseases of urinary system: Secondary | ICD-10-CM | POA: Insufficient documentation

## 2015-08-13 DIAGNOSIS — G43909 Migraine, unspecified, not intractable, without status migrainosus: Secondary | ICD-10-CM | POA: Insufficient documentation

## 2015-08-13 DIAGNOSIS — I10 Essential (primary) hypertension: Secondary | ICD-10-CM | POA: Insufficient documentation

## 2015-08-13 DIAGNOSIS — L0291 Cutaneous abscess, unspecified: Secondary | ICD-10-CM

## 2015-08-13 DIAGNOSIS — I2510 Atherosclerotic heart disease of native coronary artery without angina pectoris: Secondary | ICD-10-CM | POA: Insufficient documentation

## 2015-08-13 DIAGNOSIS — F329 Major depressive disorder, single episode, unspecified: Secondary | ICD-10-CM | POA: Insufficient documentation

## 2015-08-13 DIAGNOSIS — E785 Hyperlipidemia, unspecified: Secondary | ICD-10-CM | POA: Insufficient documentation

## 2015-08-13 DIAGNOSIS — F17210 Nicotine dependence, cigarettes, uncomplicated: Secondary | ICD-10-CM | POA: Insufficient documentation

## 2015-08-13 DIAGNOSIS — Z792 Long term (current) use of antibiotics: Secondary | ICD-10-CM | POA: Insufficient documentation

## 2015-08-13 DIAGNOSIS — M1990 Unspecified osteoarthritis, unspecified site: Secondary | ICD-10-CM | POA: Insufficient documentation

## 2015-08-13 DIAGNOSIS — J45909 Unspecified asthma, uncomplicated: Secondary | ICD-10-CM | POA: Insufficient documentation

## 2015-08-13 DIAGNOSIS — L02415 Cutaneous abscess of right lower limb: Secondary | ICD-10-CM | POA: Insufficient documentation

## 2015-08-13 DIAGNOSIS — Z8719 Personal history of other diseases of the digestive system: Secondary | ICD-10-CM | POA: Insufficient documentation

## 2015-08-13 DIAGNOSIS — E669 Obesity, unspecified: Secondary | ICD-10-CM | POA: Insufficient documentation

## 2015-08-13 MED ORDER — LIDOCAINE-EPINEPHRINE (PF) 2 %-1:200000 IJ SOLN
20.0000 mL | Freq: Once | INTRAMUSCULAR | Status: DC
  Filled 2015-08-13: qty 20

## 2015-08-13 MED ORDER — SULFAMETHOXAZOLE-TRIMETHOPRIM 800-160 MG PO TABS: 1.0000 | ORAL_TABLET | Freq: Two times a day (BID) | ORAL | Status: AC

## 2015-08-13 NOTE — ED Notes (Signed)
Patient here with abscess to left inner thigh x 3 weeks, reports stopped draining 3 days ago with increased pain

## 2015-08-13 NOTE — ED Provider Notes (Addendum)
CSN: [id number]     Arrival date & time 08/13/15  [id number] History  By signing my name below, I, Essence Howell, attest that this documentation has been prepared under the direction and in the presence of Eyvonne MechanicJeffrey Karmin Kasprzak, PA-C Electronically Signed: Charline BillsEssence Howell, ED Scribe 08/13/2015 at 10:38 AM.   Chief Complaint  Patient presents with  . Abscess   The history is provided by the patient. No language interpreter was used.   HPI Comments: Richard Anderson is a 45 y.o. male, with a h/o HTN, who presents to the Emergency Department complaining of a gradually worsening tender area of swelling to the left inner thigh for the past 3 weeks. Pt reports intermittent drainage from the area for the past 2-3 weeks that increased 3 days ago. He also reports worsening pain 3 days ago. Pt denies fever, chills, nausea, vomiting. He reports h/o 1 previous right axillary abscess that was surgically removed several years ago. 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   Past Medical History  Diagnosis Date  . Hypertension   . Obesity   . Arthritis   . Renal disorder   . Depression   . Migraine aura, persistent     since childhood  . Head ache   . Coronary artery disease   . GERD (gastroesophageal reflux disease)   . Asthma   . Suicide attempt by drug ingestion [facility]) 2012    After his father died  . Hyperlipidemia   . Hyperglycemia   . OSA (obstructive sleep apnea)   . Drug abuse, cocaine type    Past Surgical History  Procedure Laterality Date  . Cardiac catheterization      x2, last one in 2012, luminal irregularities in multiple vessels, EF 45%  . Appendectomy     Family History  Problem Relation Age of Onset  . Hypertension Mother   . Hyperlipidemia Mother   . Hypertension Brother   . Leukemia Brother   . Congestive Heart Failure Sister    Social History  Substance Use Topics  . Smoking status: Current Every Day Smoker -- 0.00 packs/day    Types:  Cigarettes  . Smokeless tobacco: None  . Alcohol Use: No    Review of Systems  A complete 10 system review of systems was obtained and all systems are negative except as noted in the HPI and PMH.   Allergies  Bee venom; Hornet venom; Aspartame and phenylalanine; Doxycycline; Phenylalanine; and Trazodone and nefazodone  Home Medications   Prior to Admission medications   Medication Sig Start Date End Date Taking? Authorizing Provider  albuterol (PROVENTIL HFA;VENTOLIN HFA) 108 (90 BASE) MCG/ACT inhaler Inhale 2 puffs into the lungs every 4 (four) hours as needed for wheezing or shortness of breath. 05/16/15   Shon Batonourtney F Horton, MD  aspirin 81 MG chewable tablet Chew 81 mg by mouth daily.    Historical Provider, MD  fluticasone (FLONASE) 50 MCG/ACT nasal spray Place 2 sprays into both nostrils daily as needed for allergies.     Historical Provider, MD  gabapentin (NEURONTIN) 800 MG tablet Take 800 mg by mouth 3 (three) times daily.    Historical Provider, MD  lisinopril (PRINIVIL,ZESTRIL) 20 MG tablet Take 20 mg by mouth daily.    Historical Provider, MD  pravastatin (PRAVACHOL) 40 MG tablet Take 1 tablet (40 mg total) by mouth daily. 10/20/14   Junius FinnerErin O'Malley, PA-C  sulfamethoxazole-trimethoprim (BACTRIM DS,SEPTRA DS) 800-160 MG tablet Take  1 tablet by mouth 2 (two) times daily. 08/13/15 08/20/15  Tristyn Pharris, PA-C   BP 138/103 mmHg  Pulse 88  Temp(Src) 97.9 F (36.6 C) (Oral)  SpO2 96% Physical Exam  Constitutional: He is oriented to person, place, and time. He appears well-developed and well-nourished. No distress.  HENT:  Head: Normocephalic and atraumatic.  Eyes: Conjunctivae and EOM are normal.  Neck: Neck supple. No tracheal deviation present.  Cardiovascular: Normal rate.   Pulmonary/Chest: Effort normal. No respiratory distress.  Musculoskeletal: Normal range of motion.  Neurological: He is alert and oriented to person, place, and time.  Skin: Skin is warm and dry.  2 cm  area of induration. No fluctuance noted on examination. No surrounding erythema.   Psychiatric: He has a normal mood and affect. His behavior is normal.  Nursing note and vitals reviewed.  ED Course  Procedures (including critical care time) DIAGNOSTIC STUDIES: Oxygen Saturation is 96% on RA, normal by my interpretation.    EMERGENCY DEPARTMENT US SOFT TISSUE INTERPRETATION "Study: Limited Ultrasound of the noted body part in comments below"  INDICATIONS: Soft tissue infection Multiple views of the body part are obtained with a multi-frequency linear probe  PERFORMED BY:  Myself  IMAGES ARCHIVED?: Yes  SIDE:Left  BODY PART:Lower extremity  FINDINGS: Abcess present and Cellulitis present  LIMITATIONS:  Body Habitus  INTERPRETATION:  Abcess present and Cellulitis present  COMMENT:     COORDINATION OF CARE: 9:48 AM-Discussed treatment plan which includes [place] and I&D with pt at bedside and pt agreed to plan.   INCISION AND DRAINAGE PROCEDURE NOTE: Patient identification was confirmed and verbal consent was obtained. This procedure was performed by Eyvonne Mechanic, PA-C at 10:21 AM. Site: L inner thigh Sterile procedures observed Needle size: 25 Anesthetic used (type and amt): 5 cc of 2% lidocaine with epinephrine  Blade size: 11 Drainage: minimal   Complexity: Complex Site anesthetized, incision made over site, wound drained and explored loculations, rinsed with copious amounts of normal saline, covered with dry, sterile dressing. Pt tolerated procedure well without complications.   Instructions for care discussed verbally and pt provided with additional written instructions for homecare and f/u.  Labs Review Labs Reviewed - No data to display  Imaging Review No results found.   EKG Interpretation None      MDM   Final diagnoses:  Abscess    Labs:  Imaging:  Consults:  Therapeutics:  Discharge Meds:   Assessment/Plan:  Patient presents with  and complicated abscess. I&D performed here with success. Due to patient's history of significant abscess with infection in the place of prophylactic antibiotics, 3 day follow-up, and strict return precautions given. Patient verbalized understanding and agreement today's plan.      I personally performed the services described in this documentation, which was scribed in my presence. The recorded information has been reviewed and is accurate.     Eyvonne Mechanic, PA-C 08/13/15 1740  Marily Memos, MD 08/15/15 2103  Eyvonne Mechanic, PA-C 08/21/15 7782  Marily Memos, MD 08/22/15 [phone]

## 2015-08-13 NOTE — Discharge Instructions (Signed)
Incision and Drainage °Incision and drainage is a procedure in which a sac-like structure (cystic structure) is opened and drained. The area to be drained usually contains material such as pus, fluid, or blood.  °LET YOUR CAREGIVER KNOW ABOUT:  °· Allergies to medicine. °· Medicines taken, including vitamins, herbs, eyedrops, over-the-counter medicines, and creams. °· Use of steroids (by mouth or creams). °· Previous problems with anesthetics or numbing medicines. °· History of bleeding problems or blood clots. °· Previous surgery. °· Other health problems, including diabetes and kidney problems. °· Possibility of pregnancy, if this applies. °RISKS AND COMPLICATIONS °· Pain. °· Bleeding. °· Scarring. °· Infection. °BEFORE THE PROCEDURE  °You may need to have an ultrasound or other imaging tests to see how large or deep your cystic structure is. Blood tests may also be used to determine if you have an infection or how severe the infection is. You may need to have a tetanus shot. °PROCEDURE  °The affected area is cleaned with a cleaning fluid. The cyst area will then be numbed with a medicine (local anesthetic). A small incision will be made in the cystic structure. A syringe or catheter may be used to drain the contents of the cystic structure, or the contents may be squeezed out. The area will then be flushed with a cleansing solution. After cleansing the area, it is often gently packed with a gauze or another wound dressing. Once it is packed, it will be covered with gauze and tape or some other type of wound dressing.  °AFTER THE PROCEDURE  °· Often, you will be allowed to go home right after the procedure. °· You may be given antibiotic medicine to prevent or heal an infection. °· If the area was packed with gauze or some other wound dressing, you will likely need to come back in 1 to 2 days to get it removed. °· The area should heal in about 14 days. °  °This information is not intended to replace advice given  to you by your health care provider. Make sure you discuss any questions you have with your health care provider. °  °Document Released: 11/19/2000 Document Revised: 11/25/2011 Document Reviewed: 07/21/2011 °Elsevier Interactive Patient Education ©2016 [employer] ° °Abscess °An abscess is an infected area that contains a collection of pus and debris. It can occur in almost any part of the body. An abscess is also known as a furuncle or boil. °CAUSES  °An abscess occurs when tissue gets infected. This can occur from blockage of oil or sweat glands, infection of hair follicles, or a minor injury to the skin. As the body tries to fight the infection, pus collects in the area and creates pressure under the skin. This pressure causes pain. People with weakened immune systems have difficulty fighting infections and get certain abscesses more often.  °SYMPTOMS °Usually an abscess develops on the skin and becomes a painful mass that is red, warm, and tender. If the abscess forms under the skin, you may feel a moveable soft area under the skin. Some abscesses break open (rupture) on their own, but most will continue to get worse without care. The infection can spread deeper into the body and eventually into the bloodstream, causing you to feel ill.  °DIAGNOSIS  °Your caregiver will take your medical history and perform a physical exam. A sample of fluid may also be taken from the abscess to determine what is causing your infection. °TREATMENT  °Your caregiver may prescribe antibiotic medicines to fight the infection.   However, taking antibiotics alone usually does not cure an abscess. Your caregiver may need to make a small cut (incision) in the abscess to drain the pus. In some cases, gauze is packed into the abscess to reduce pain and to continue draining the area. °HOME CARE INSTRUCTIONS  °· Only take over-the-counter or prescription medicines for pain, discomfort, or fever as directed by your caregiver. °· If you were  prescribed antibiotics, take them as directed. Finish them even if you start to feel better. °· If gauze is used, follow your caregiver's directions for changing the gauze. °· To avoid spreading the infection: °¨ Keep your draining abscess covered with a bandage. °¨ Wash your hands well. °¨ Do not share personal care items, towels, or whirlpools with others. °¨ Avoid skin contact with others. °· Keep your skin and clothes clean around the abscess. °· Keep all follow-up appointments as directed by your caregiver. °SEEK MEDICAL CARE IF:  °· You have increased pain, swelling, redness, fluid drainage, or bleeding. °· You have muscle aches, chills, or a general ill feeling. °· You have a fever. °MAKE SURE YOU:  °· Understand these instructions. °· Will watch your condition. °· Will get help right away if you are not doing well or get worse. °  °This information is not intended to replace advice given to you by your health care provider. Make sure you discuss any questions you have with your health care provider. °  °Document Released: 03/05/2005 Document Revised: 11/25/2011 Document Reviewed: 08/08/2011 °Elsevier Interactive Patient Education ©2016 [employer] ° °

## 2015-10-05 ENCOUNTER — Emergency Department (HOSPITAL_COMMUNITY)
Admission: EM | Admit: 2015-10-05 | Discharge: 2015-10-06 | Discharge status: Against Medical Advice | Payer: Medicaid - Out of State | Attending: Emergency Medicine | Admitting: Emergency Medicine

## 2015-10-05 ENCOUNTER — Encounter (HOSPITAL_COMMUNITY): Payer: Self-pay | Admitting: Emergency Medicine

## 2015-10-05 ENCOUNTER — Emergency Department (HOSPITAL_COMMUNITY): Payer: Medicaid - Out of State

## 2015-10-05 DIAGNOSIS — Z79899 Other long term (current) drug therapy: Secondary | ICD-10-CM | POA: Insufficient documentation

## 2015-10-05 DIAGNOSIS — Z9889 Other specified postprocedural states: Secondary | ICD-10-CM | POA: Insufficient documentation

## 2015-10-05 DIAGNOSIS — Z8659 Personal history of other mental and behavioral disorders: Secondary | ICD-10-CM | POA: Insufficient documentation

## 2015-10-05 DIAGNOSIS — E669 Obesity, unspecified: Secondary | ICD-10-CM | POA: Insufficient documentation

## 2015-10-05 DIAGNOSIS — Z7982 Long term (current) use of aspirin: Secondary | ICD-10-CM | POA: Insufficient documentation

## 2015-10-05 DIAGNOSIS — Z8719 Personal history of other diseases of the digestive system: Secondary | ICD-10-CM | POA: Insufficient documentation

## 2015-10-05 DIAGNOSIS — Z8669 Personal history of other diseases of the nervous system and sense organs: Secondary | ICD-10-CM | POA: Insufficient documentation

## 2015-10-05 DIAGNOSIS — M1990 Unspecified osteoarthritis, unspecified site: Secondary | ICD-10-CM | POA: Insufficient documentation

## 2015-10-05 DIAGNOSIS — J45909 Unspecified asthma, uncomplicated: Secondary | ICD-10-CM | POA: Insufficient documentation

## 2015-10-05 DIAGNOSIS — I2510 Atherosclerotic heart disease of native coronary artery without angina pectoris: Secondary | ICD-10-CM | POA: Insufficient documentation

## 2015-10-05 DIAGNOSIS — R079 Chest pain, unspecified: Secondary | ICD-10-CM | POA: Insufficient documentation

## 2015-10-05 DIAGNOSIS — F17210 Nicotine dependence, cigarettes, uncomplicated: Secondary | ICD-10-CM | POA: Insufficient documentation

## 2015-10-05 DIAGNOSIS — Z87448 Personal history of other diseases of urinary system: Secondary | ICD-10-CM | POA: Insufficient documentation

## 2015-10-05 DIAGNOSIS — I10 Essential (primary) hypertension: Secondary | ICD-10-CM | POA: Insufficient documentation

## 2015-10-05 LAB — BASIC METABOLIC PANEL
Anion gap: 12 (ref 5–15)
BUN: 19 mg/dL (ref 6–20)
CHLORIDE: 108 mmol/L (ref 101–111)
CO2: 18 mmol/L — AB (ref 22–32)
CREATININE: 1.18 mg/dL (ref 0.61–1.24)
Calcium: 8.7 mg/dL — ABNORMAL LOW (ref 8.9–10.3)
GFR calc Af Amer: >60 mL/min (ref >60)
GFR calc non Af Amer: >60 mL/min (ref >60)
GLUCOSE: 116 mg/dL — AB (ref 65–99)
Potassium: 4.4 mmol/L (ref 3.5–5.1)
SODIUM: 138 mmol/L (ref 135–145)

## 2015-10-05 LAB — CBC
HEMATOCRIT: 45.2 % (ref 39.0–52.0)
Hemoglobin: 14.3 g/dL (ref 13.0–17.0)
MCH: 27.1 pg (ref 26.0–34.0)
MCHC: 31.6 g/dL (ref 30.0–36.0)
MCV: 85.8 fL (ref 78.0–100.0)
PLATELETS: 270 10*3/uL (ref 150–400)
RBC: 5.27 MIL/uL (ref 4.22–5.81)
RDW: 14.6 % (ref 11.5–15.5)
WBC: 10.7 10*3/uL — ABNORMAL HIGH (ref 4.0–10.5)

## 2015-10-05 MED ORDER — ASPIRIN 81 MG PO CHEW
324.0000 mg | CHEWABLE_TABLET | Freq: Once | ORAL | Status: AC
  Administered 2015-10-05: 324 mg via ORAL
  Filled 2015-10-05: qty 4

## 2015-10-05 MED ORDER — NITROGLYCERIN 0.4 MG SL SUBL
0.4000 mg | SUBLINGUAL_TABLET | SUBLINGUAL | Status: DC | PRN
  Administered 2015-10-05 (×2): 0.4 mg via SUBLINGUAL
  Filled 2015-10-05: qty 1

## 2015-10-05 NOTE — ED Notes (Signed)
C/o pressure to L chest that radiates to L jaw and L arm with sob and nausea x 15 min.  Pain started 30 min after smoking marijuana.

## 2015-10-05 NOTE — ED Provider Notes (Signed)
CSN: [id number]     Arrival date & time 10/05/15  Oct 12, 2253 History  By signing my name below, I, Richard Anderson, attest that this documentation has been prepared under the direction and in the presence of Gilda Crease, MD. Electronically Signed: Angelene Giovanni, ED Scribe. 10/05/2015. 11:44 PM.    Chief Complaint  Patient presents with  . Chest Pain   Patient is a 45 y.o. male presenting with chest pain. The history is provided by the patient. No language interpreter was used.  Chest Pain Pain location:  L chest Pain radiates to:  L jaw and L arm Pain radiates to the back: no   Pain severity:  Moderate Onset quality:  Sudden Timing:  Constant Progression:  Unchanged Chronicity:  New Context: no movement and not raising an arm   Relieved by:  None tried Worsened by:  Nothing tried Ineffective treatments:  None tried Associated symptoms: shortness of breath   Associated symptoms: no abdominal pain, no back pain, no cough, no fever, no nausea and not vomiting   Risk factors: high cholesterol, hypertension, male sex, obesity and smoking    HPI Comments: Richard Anderson is a 45 y.o. male with a hx of HTN and HLD who presents to the Emergency Department complaining of sudden onset of ongoing constant left lateral CP that radiates to his left arm and left jaw onset 40 minutes ago. Pt reports associated SOB. He explains that he was driving upon onset of CP. He denies that movement or palpation makes the pain worse. No alleviating factors noted. Pt has not tried any medications PTA. He reports that his parents both died from an MI. Pt is a current smoker. No fever, n/v, or cough.    Past Medical History  Diagnosis Date  . Hypertension   . Obesity   . Arthritis   . Renal disorder   . Depression   . Migraine aura, persistent     since childhood  . Head ache   . Coronary artery disease   . GERD (gastroesophageal reflux disease)   . Asthma   . Suicide attempt by drug ingestion  [facility]) Oct 13, 2010    After his father died  . Hyperlipidemia   . Hyperglycemia   . OSA (obstructive sleep apnea)   . Drug abuse, cocaine type    Past Surgical History  Procedure Laterality Date  . Cardiac catheterization      x2, last one in 2010/10/13, luminal irregularities in multiple vessels, EF 45%  . Appendectomy     Family History  Problem Relation Age of Onset  . Hypertension Mother   . Hyperlipidemia Mother   . Hypertension Brother   . Leukemia Brother   . Congestive Heart Failure Sister    Social History  Substance Use Topics  . Smoking status: Current Every Day Smoker -- 0.00 packs/day    Types: Cigarettes  . Smokeless tobacco: None  . Alcohol Use: 0.0 oz/week    0 Standard drinks or equivalent per week    Review of Systems  Constitutional: Negative for fever.  Respiratory: Positive for shortness of breath. Negative for cough.   Cardiovascular: Positive for chest pain.  Gastrointestinal: Negative for nausea, vomiting and abdominal pain.  Musculoskeletal: Negative for back pain.  All other systems reviewed and are negative.     Allergies  Bee venom; Hornet venom; Aspartame and phenylalanine; Doxycycline; Phenylalanine; and Trazodone and nefazodone  Home Medications   Prior to Admission medications   Medication Sig Start Date End  Date Taking? Authorizing Provider  albuterol (PROVENTIL HFA;VENTOLIN HFA) 108 (90 BASE) MCG/ACT inhaler Inhale 2 puffs into the lungs every 4 (four) hours as needed for wheezing or shortness of breath. 05/16/15   Shon Batonourtney F Horton, MD  aspirin 81 MG chewable tablet Chew 81 mg by mouth daily.    Historical Provider, MD  fluticasone (FLONASE) 50 MCG/ACT nasal spray Place 2 sprays into both nostrils daily as needed for allergies.     Historical Provider, MD  gabapentin (NEURONTIN) 800 MG tablet Take 800 mg by mouth 3 (three) times daily.    Historical Provider, MD  lisinopril (PRINIVIL,ZESTRIL) 20 MG tablet Take 20 mg by mouth daily.    Historical  Provider, MD  pravastatin (PRAVACHOL) 40 MG tablet Take 1 tablet (40 mg total) by mouth daily. 10/20/14   Junius FinnerErin O'Malley, PA-C   BP 159/107 mmHg  Pulse 85  Temp(Src) 97.9 F (36.6 C) (Oral)  Resp 20  SpO2 92% Physical Exam  Constitutional: He is oriented to person, place, and time. He appears well-developed and well-nourished. No distress.  HENT:  Head: Normocephalic and atraumatic.  Right Ear: Hearing normal.  Left Ear: Hearing normal.  Nose: Nose normal.  Mouth/Throat: Oropharynx is clear and moist and mucous membranes are normal.  Eyes: Conjunctivae and EOM are normal. Pupils are equal, round, and reactive to light.  Neck: Normal range of motion. Neck supple.  Cardiovascular: Regular rhythm, S1 normal and S2 normal.  Exam reveals no gallop and no friction rub.   No murmur heard. Pulmonary/Chest: Effort normal and breath sounds normal. No respiratory distress. He exhibits no tenderness.  Abdominal: Soft. Normal appearance and bowel sounds are normal. There is no hepatosplenomegaly. There is no tenderness. There is no rebound, no guarding, no tenderness at McBurney's point and negative Murphy's sign. No hernia.  Musculoskeletal: Normal range of motion.  Neurological: He is alert and oriented to person, place, and time. He has normal strength. No cranial nerve deficit or sensory deficit. Coordination normal. GCS eye subscore is 4. GCS verbal subscore is 5. GCS motor subscore is 6.  Skin: Skin is warm, dry and intact. No rash noted. No cyanosis.  Psychiatric: He has a normal mood and affect. His speech is normal and behavior is normal. Thought content normal.  Nursing note and vitals reviewed.   ED Course  Procedures (including critical care time) DIAGNOSTIC STUDIES: Oxygen Saturation is 92% on RA, low by my interpretation.    COORDINATION OF CARE: 11:40 PM- Pt advised of plan for treatment and pt agrees. Pt informed of x-ray results. Pt will receive aspiring tablets and NG. He will  also receive chest x-ray for further evaluation.    Labs Review Labs Reviewed  BASIC METABOLIC PANEL - Abnormal; Notable for the following:    CO2 18 (*)    Glucose, Bld 116 (*)    Calcium 8.7 (*)    All other components within normal limits  CBC - Abnormal; Notable for the following:    WBC 10.7 (*)    All other components within normal limits  I-STAT TROPOININ, ED    Imaging Review No results found.   Gilda Creasehristopher J Lazara Grieser, MD has personally reviewed and evaluated these images and lab results as part of his medical decision-making.   EKG Interpretation   Date/Time:  Friday October 05 2015 22:59:18 EDT Ventricular Rate:  97 PR Interval:  132 QRS Duration: 82 QT Interval:  378 QTC Calculation: 480 R Axis:   32 Text Interpretation:  Normal  sinus rhythm Prolonged QT Abnormal ECG  Confirmed by Araf Clugston  MD, Misha Antonini [phone]) on 10/05/2015 11:34:23 PM      MDM   Final diagnoses:  None  Chest pain  Patient presents to the ER for evaluation of chest pain. Patient reports that he had been experiencing pain in the left side of his chest that radiated to his left neck and arm. He had commented shortness of breath. Symptoms started 15 minutes before arrival in the ER. He admits to smoking marijuana approximately 30 minutes before onset of pain. EKG does not show any acute ST-T segment changes. His initial troponin is negative. Chest x-ray is negative including no evidence of pneumothorax.  I had ordered a CT angiography of chest to rule out aortic dissection and PE. Patient has now decided that he does not want to wait in the ER any longer. 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 I did have a lengthy conversation with him about the risk of missing coronary artery disease, MI, aortic dissection, PE. He understands that these are serious conditions that could cause permanent disability or death. He has the  capacity to provide informed refusal. After lengthy discussion of the risks, he did decline further workup and chose to leave AGAINST MEDICAL ADVICE. Patient was counseled that he can return to the ER at any time for further evaluation.  I personally performed the services described in this documentation, which was scribed in my presence. The recorded information has been reviewed and is accurate.   Gilda Crease, MD 10/06/15 [phone]

## 2015-10-06 LAB — I-STAT TROPONIN, ED: Troponin i, poc: 0 ng/mL (ref 0.00–0.08)

## 2015-10-06 MED ORDER — SODIUM CHLORIDE 0.9 % IV BOLUS (SEPSIS): 500.0000 mL | Freq: Once | INTRAVENOUS | Status: DC

## 2015-10-06 NOTE — ED Notes (Signed)
Pt requesting to leave AMA. MD made aware. Pt requesting IV to be removed and to be released.

## 2016-12-30 IMAGING — DX DG CHEST 2V
2 series · 2 of 2 positions shown · non-contrast
Comparison: 10/05/2014, 02/24/2015

CLINICAL DATA: Left-sided chest pain.  Left-sided jaw and arm pain.

EXAM:
CHEST  2 VIEW

[chest pa]
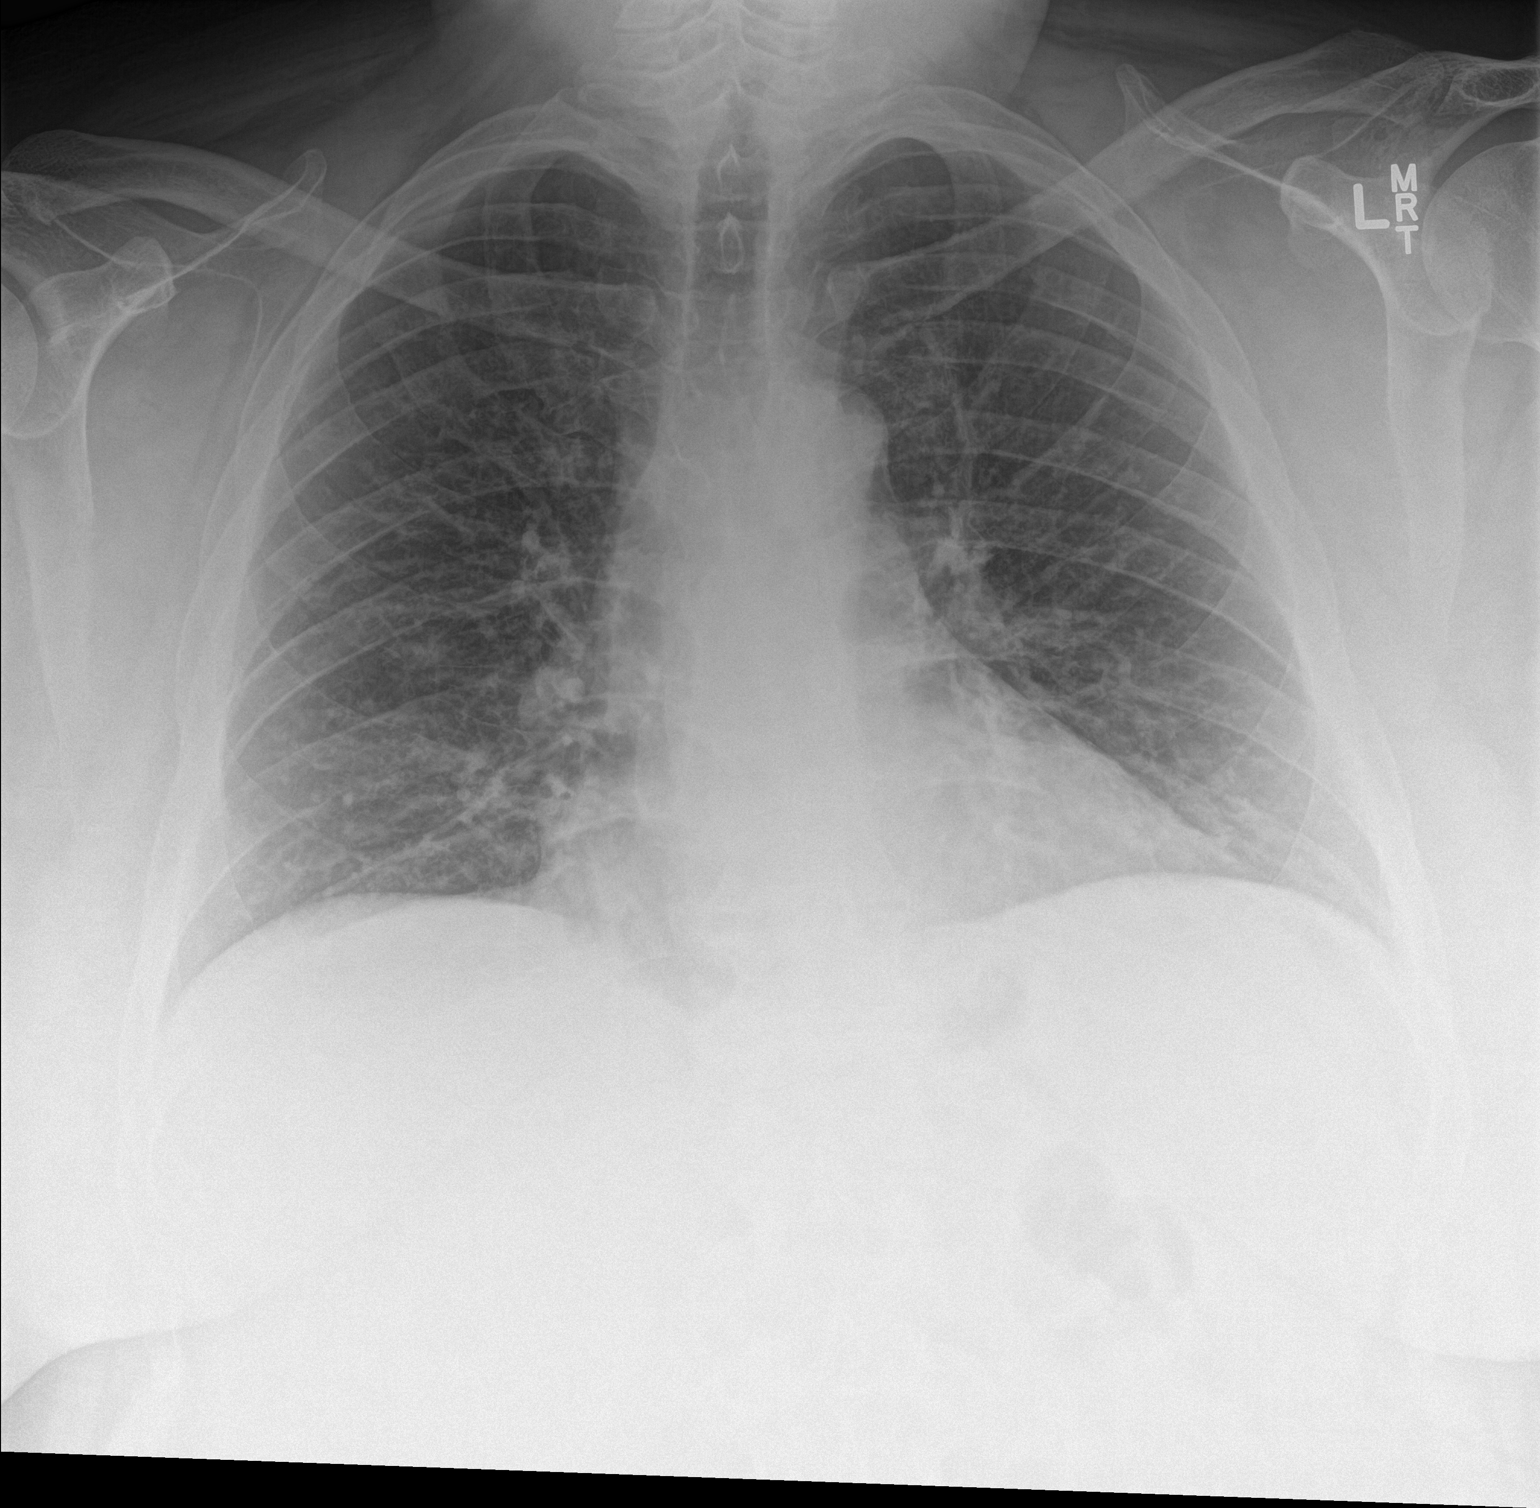

[chest lat]
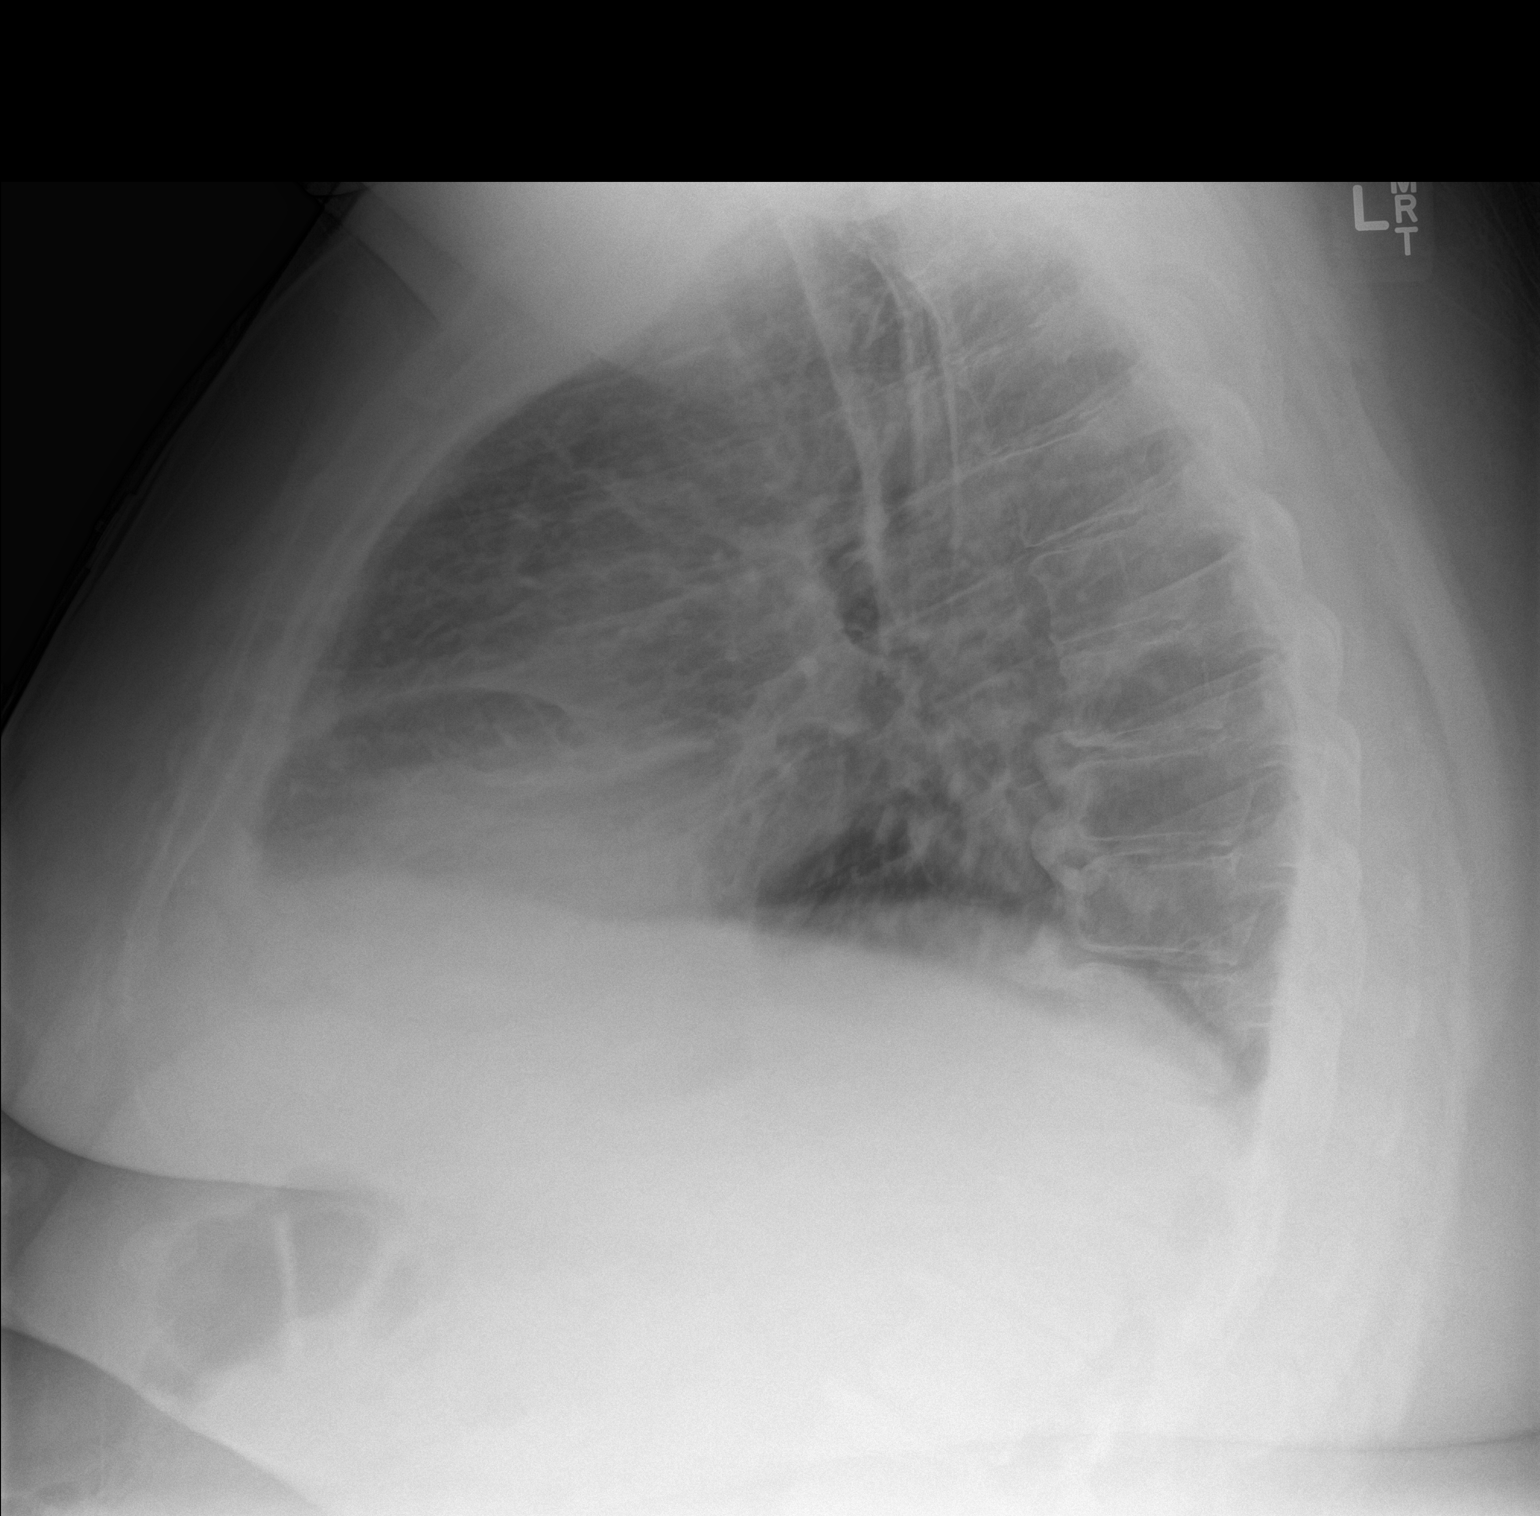

[2 of 2 positions shown; findings below may reference images not displayed]

FINDINGS: There is no focal parenchymal opacity. There is no pleural effusion
or pneumothorax. The heart and mediastinal contours are
unremarkable.

The osseous structures are unremarkable.
IMPRESSION: No active cardiopulmonary disease.

## 2017-11-22 IMAGING — US US RENAL
1 series · 14 of 25 positions shown · non-contrast
Comparison: CT of the abdomen pelvis performed 05/11/2015, and
renal ultrasound performed 02/07/2015

CLINICAL DATA: Acute onset of left flank pain.  Initial encounter.

EXAM:
RENAL / URINARY TRACT ULTRASOUND COMPLETE

[Series 1: us renal · 0.23mm/px · 14 of 32 slices shown]
[im 1/32]
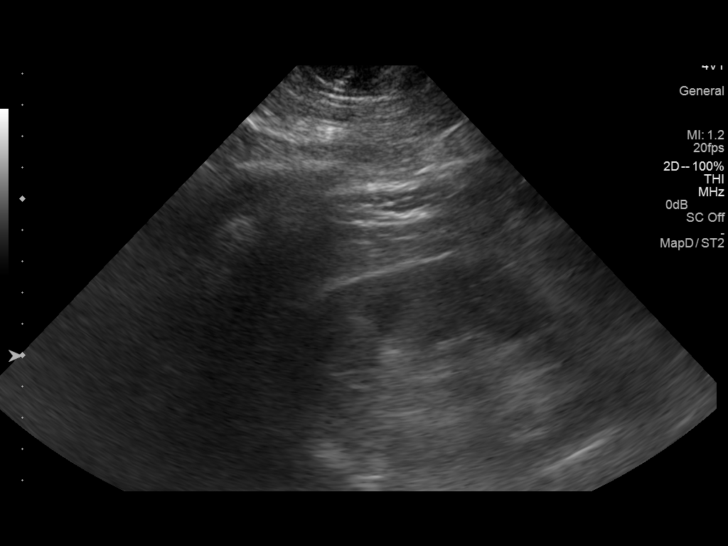
[im 3/32]
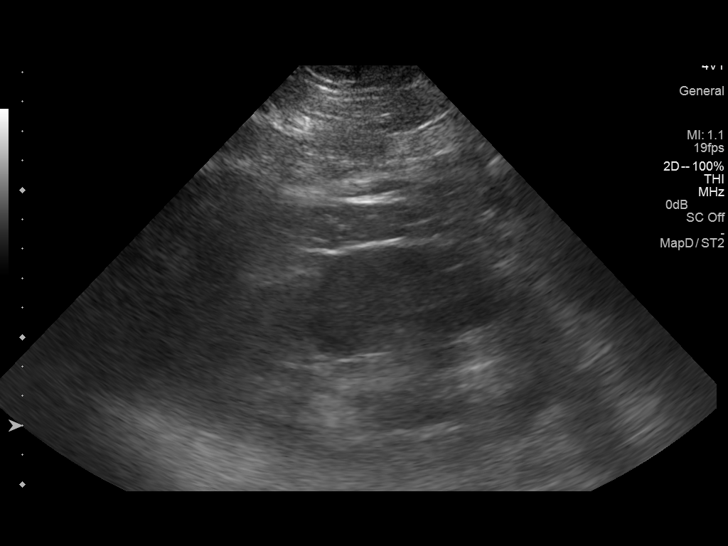
[im 6/32]
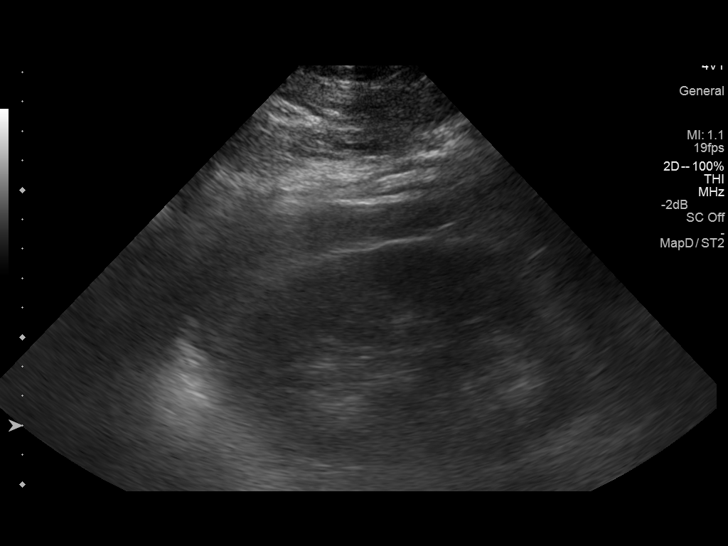
[im 8/32]
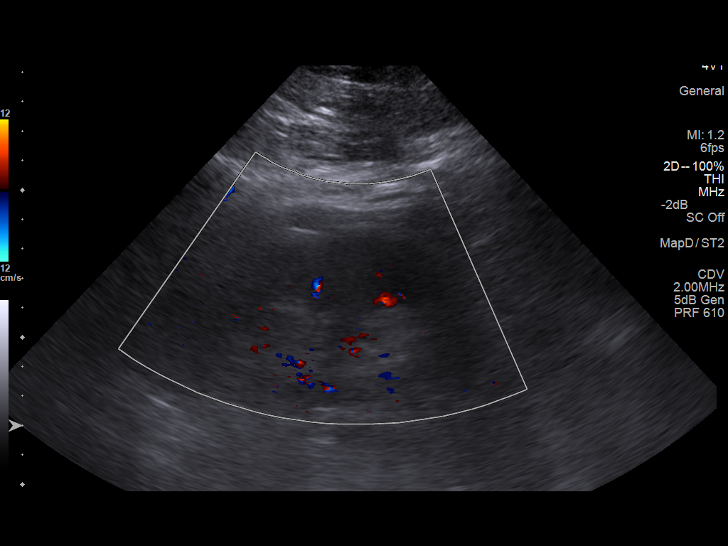
[im 11/32]
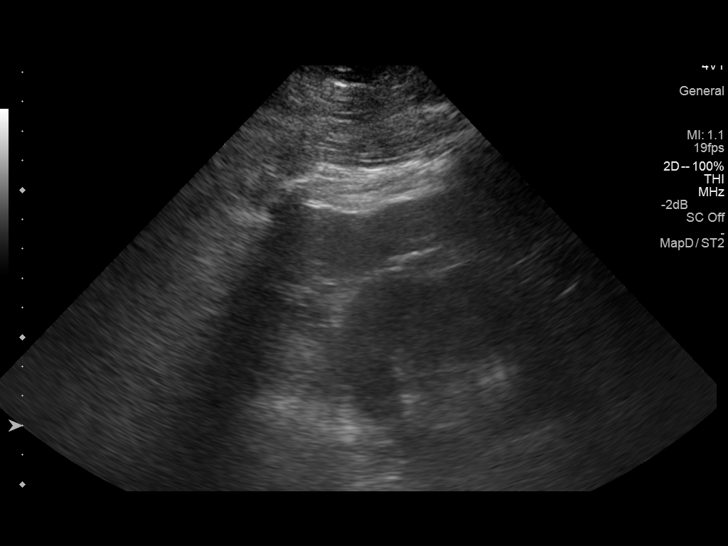
[im 12/32]
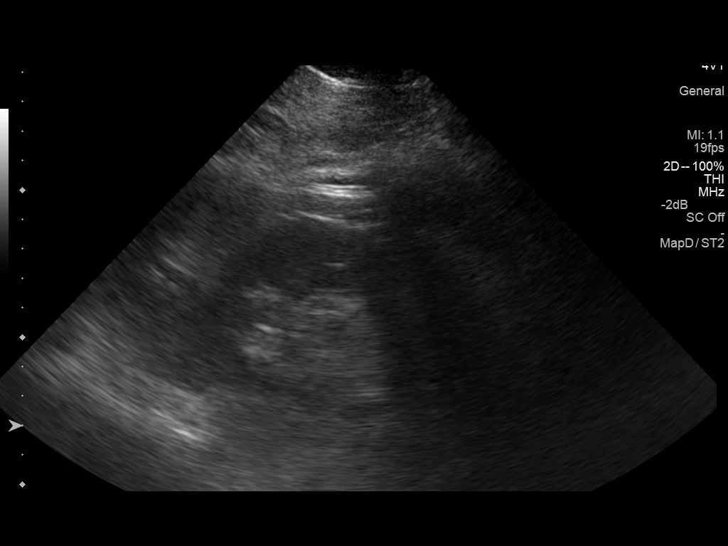
[im 15/32]
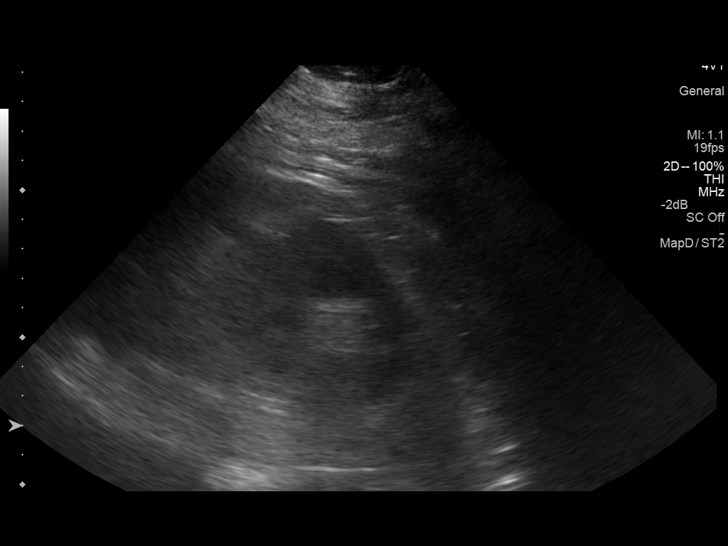
[im 17/32]
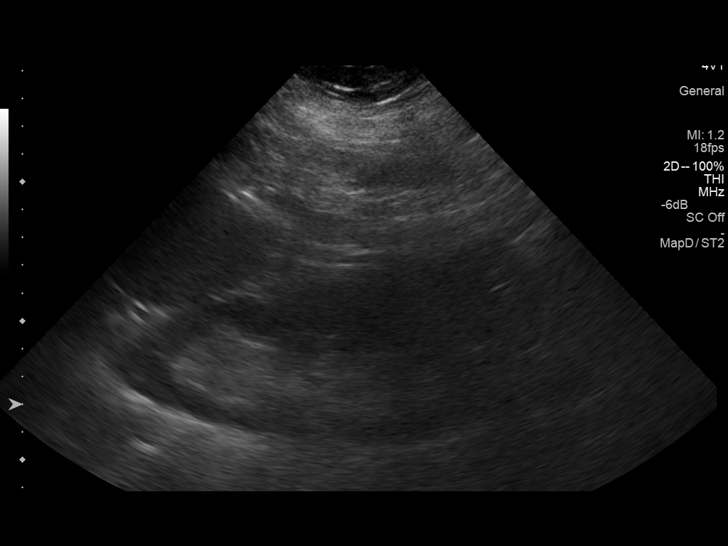
[im 20/32]
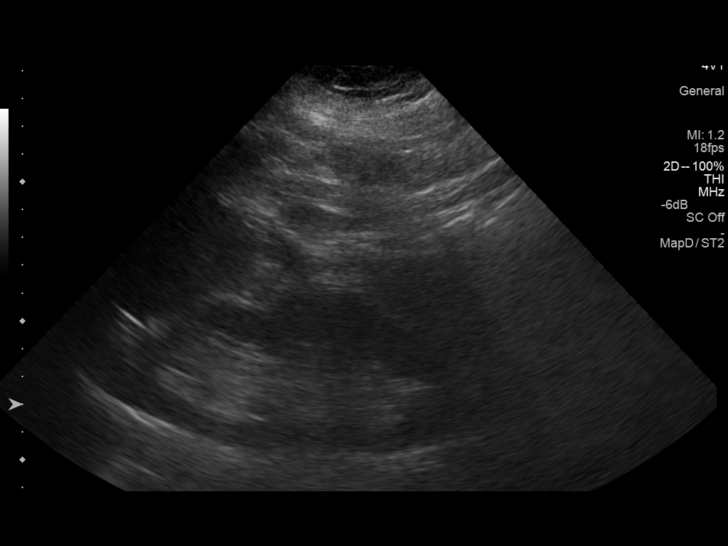
[im 21/32]
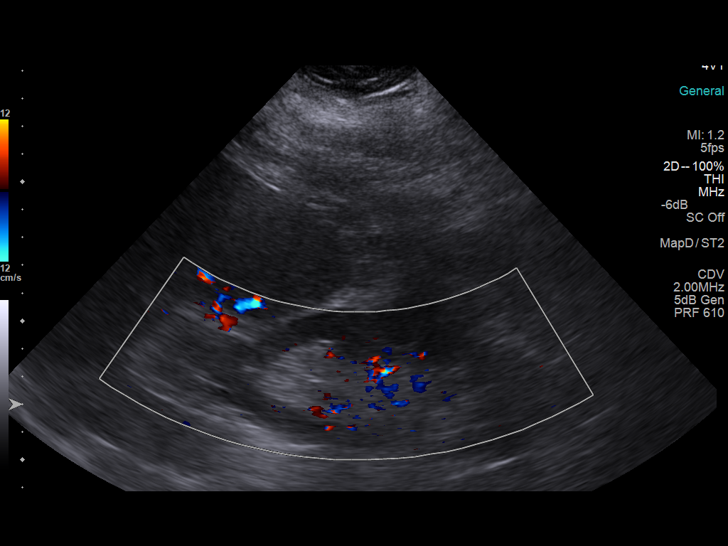
[im 24/32]
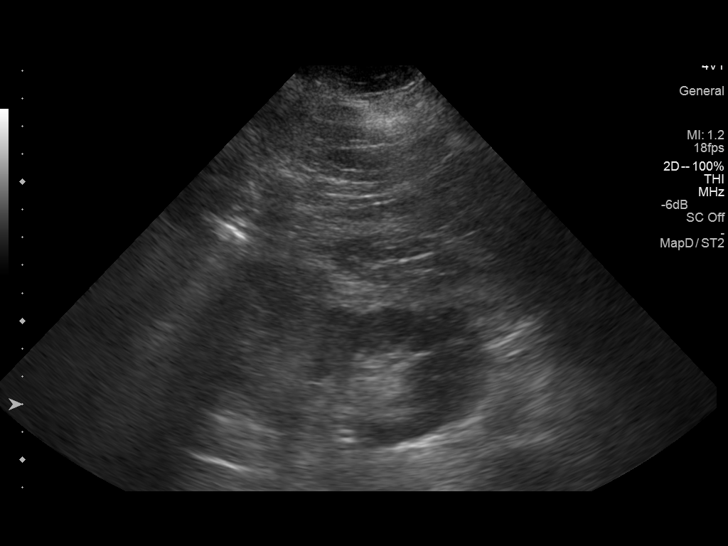
[im 26/32]
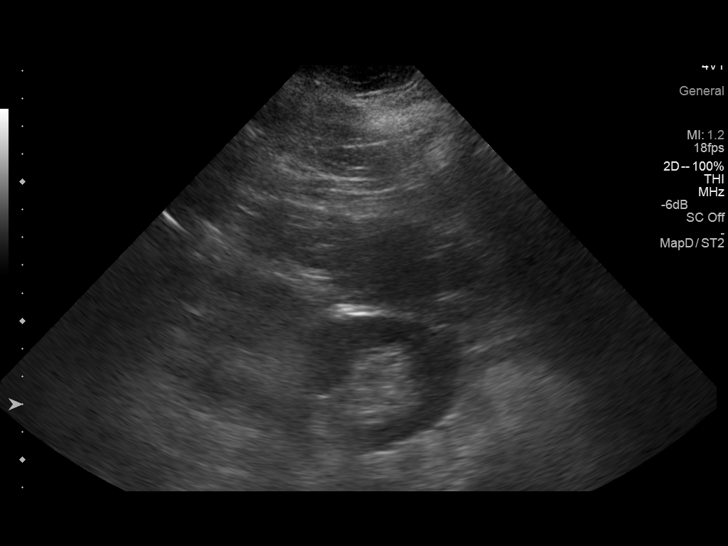
[im 29/32]
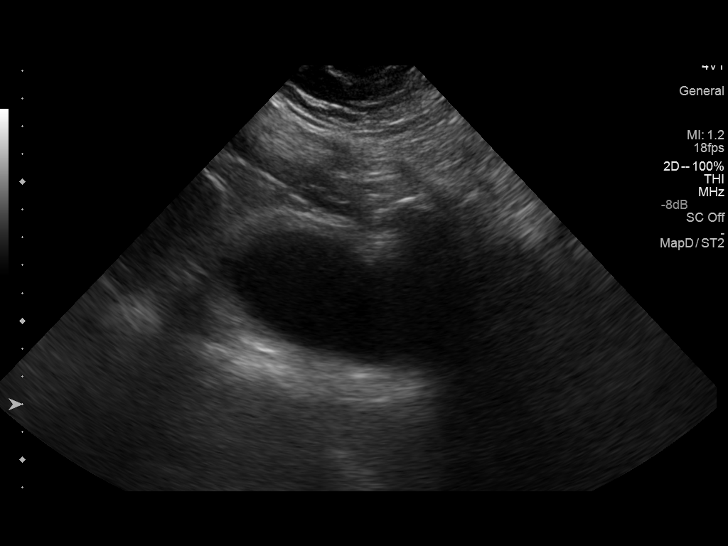
[im 32/32]
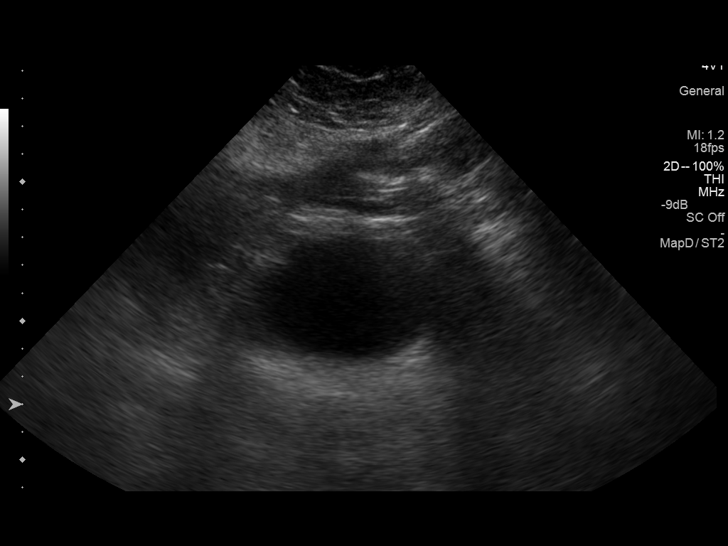

[14 of 25 positions shown; findings below may reference images not displayed]

FINDINGS: Right Kidney:

Length: 12.7 cm. Echogenicity within normal limits. There is
question of a parenchymal calcification at the interpole region of
the right kidney. No mass or hydronephrosis visualized.

Left Kidney:

Length: 13.6 cm. Echogenicity within normal limits. A mild
left-sided dromedary hump is noted. No mass or hydronephrosis
visualized.

Bladder:

Appears normal for degree of bladder distention.
IMPRESSION: No evidence of hydronephrosis.  Unremarkable renal ultrasound.
